# Patient Record
Sex: Male | Born: 1935 | Race: White | Hispanic: No | Marital: Married | Smoking: Current some day smoker
Health system: Southern US, Community
[De-identification: ages and names within clinical notes are randomized; demographics above are authoritative.]

## PROBLEM LIST (undated history)

## (undated) DIAGNOSIS — J449 Chronic obstructive pulmonary disease, unspecified: Secondary | ICD-10-CM

## (undated) DIAGNOSIS — I10 Essential (primary) hypertension: Secondary | ICD-10-CM

## (undated) DIAGNOSIS — M1611 Unilateral primary osteoarthritis, right hip: Secondary | ICD-10-CM

## (undated) DIAGNOSIS — M75111 Incomplete rotator cuff tear or rupture of right shoulder, not specified as traumatic: Secondary | ICD-10-CM

## (undated) DIAGNOSIS — R351 Nocturia: Secondary | ICD-10-CM

## (undated) DIAGNOSIS — M1990 Unspecified osteoarthritis, unspecified site: Secondary | ICD-10-CM

## (undated) DIAGNOSIS — I480 Paroxysmal atrial fibrillation: Secondary | ICD-10-CM

## (undated) DIAGNOSIS — J45909 Unspecified asthma, uncomplicated: Secondary | ICD-10-CM

## (undated) DIAGNOSIS — Z7901 Long term (current) use of anticoagulants: Secondary | ICD-10-CM

## (undated) DIAGNOSIS — R0602 Shortness of breath: Secondary | ICD-10-CM

## (undated) DIAGNOSIS — E785 Hyperlipidemia, unspecified: Secondary | ICD-10-CM

## (undated) HISTORY — DX: Paroxysmal atrial fibrillation: I48.0

## (undated) HISTORY — DX: Chronic obstructive pulmonary disease, unspecified: J44.9

## (undated) HISTORY — DX: Essential (primary) hypertension: I10

## (undated) HISTORY — DX: Unspecified osteoarthritis, unspecified site: M19.90

## (undated) HISTORY — DX: Long term (current) use of anticoagulants: Z79.01

## (undated) HISTORY — PX: APPENDECTOMY: SHX54

## (undated) HISTORY — PX: OTHER SURGICAL HISTORY: SHX169

## (undated) HISTORY — PX: CATARACT EXTRACTION W/ INTRAOCULAR LENS  IMPLANT, BILATERAL: SHX1307

## (undated) HISTORY — DX: Hyperlipidemia, unspecified: E78.5

## (undated) HISTORY — PX: CHOLECYSTECTOMY: SHX55

---

## 1992-04-29 HISTORY — PX: SHOULDER ARTHROSCOPY: SHX128

## 1996-04-29 HISTORY — PX: TOTAL HIP ARTHROPLASTY: SHX124

## 1999-08-07 ENCOUNTER — Encounter: Payer: Self-pay | Admitting: Cardiology

## 1999-08-07 ENCOUNTER — Inpatient Hospital Stay (HOSPITAL_COMMUNITY): Admission: AD | Admit: 1999-08-07 | Discharge: 1999-08-14 | Payer: Self-pay | Admitting: Cardiology

## 1999-08-08 ENCOUNTER — Encounter: Payer: Self-pay | Admitting: Cardiology

## 1999-08-29 ENCOUNTER — Encounter: Payer: Self-pay | Admitting: Cardiology

## 1999-08-29 ENCOUNTER — Ambulatory Visit (HOSPITAL_COMMUNITY): Admission: RE | Admit: 1999-08-29 | Discharge: 1999-08-29 | Payer: Self-pay | Admitting: Cardiology

## 1999-10-10 ENCOUNTER — Emergency Department (HOSPITAL_COMMUNITY): Admission: EM | Admit: 1999-10-10 | Discharge: 1999-10-10 | Payer: Self-pay | Admitting: Emergency Medicine

## 2000-04-07 ENCOUNTER — Encounter: Payer: Self-pay | Admitting: Endocrinology

## 2000-04-07 ENCOUNTER — Inpatient Hospital Stay (HOSPITAL_COMMUNITY): Admission: AD | Admit: 2000-04-07 | Discharge: 2000-04-10 | Payer: Self-pay | Admitting: Endocrinology

## 2000-04-09 ENCOUNTER — Encounter: Payer: Self-pay | Admitting: Internal Medicine

## 2000-04-11 ENCOUNTER — Encounter: Payer: Self-pay | Admitting: Orthopaedic Surgery

## 2000-04-11 ENCOUNTER — Encounter: Admission: RE | Admit: 2000-04-11 | Discharge: 2000-04-11 | Payer: Self-pay | Admitting: Orthopedic Surgery

## 2000-04-18 ENCOUNTER — Encounter: Payer: Self-pay | Admitting: Internal Medicine

## 2000-04-18 ENCOUNTER — Ambulatory Visit (HOSPITAL_COMMUNITY): Admission: RE | Admit: 2000-04-18 | Discharge: 2000-04-18 | Payer: Self-pay | Admitting: Internal Medicine

## 2000-07-29 ENCOUNTER — Ambulatory Visit (HOSPITAL_COMMUNITY): Admission: RE | Admit: 2000-07-29 | Discharge: 2000-07-29 | Payer: Self-pay | Admitting: Internal Medicine

## 2000-07-29 ENCOUNTER — Encounter: Payer: Self-pay | Admitting: Internal Medicine

## 2001-10-09 ENCOUNTER — Ambulatory Visit (HOSPITAL_COMMUNITY): Admission: RE | Admit: 2001-10-09 | Discharge: 2001-10-09 | Payer: Self-pay | Admitting: Internal Medicine

## 2001-10-21 ENCOUNTER — Ambulatory Visit (HOSPITAL_COMMUNITY): Admission: RE | Admit: 2001-10-21 | Discharge: 2001-10-21 | Payer: Self-pay | Admitting: Internal Medicine

## 2001-12-15 ENCOUNTER — Encounter: Payer: Self-pay | Admitting: Internal Medicine

## 2001-12-15 ENCOUNTER — Ambulatory Visit (HOSPITAL_COMMUNITY): Admission: RE | Admit: 2001-12-15 | Discharge: 2001-12-15 | Payer: Self-pay | Admitting: Internal Medicine

## 2002-06-01 ENCOUNTER — Encounter: Payer: Self-pay | Admitting: Orthopedic Surgery

## 2002-06-04 ENCOUNTER — Inpatient Hospital Stay (HOSPITAL_COMMUNITY): Admission: RE | Admit: 2002-06-04 | Discharge: 2002-06-09 | Payer: Self-pay | Admitting: Orthopedic Surgery

## 2002-06-07 ENCOUNTER — Encounter: Payer: Self-pay | Admitting: Orthopedic Surgery

## 2004-07-13 ENCOUNTER — Encounter: Admission: RE | Admit: 2004-07-13 | Discharge: 2004-07-13 | Payer: Self-pay | Admitting: Internal Medicine

## 2004-07-24 ENCOUNTER — Encounter: Admission: RE | Admit: 2004-07-24 | Discharge: 2004-07-24 | Payer: Self-pay | Admitting: Internal Medicine

## 2004-07-27 ENCOUNTER — Ambulatory Visit (HOSPITAL_COMMUNITY): Admission: RE | Admit: 2004-07-27 | Discharge: 2004-07-27 | Payer: Self-pay | Admitting: Internal Medicine

## 2006-08-20 ENCOUNTER — Ambulatory Visit: Payer: Self-pay | Admitting: Gastroenterology

## 2006-09-03 ENCOUNTER — Encounter: Payer: Self-pay | Admitting: Gastroenterology

## 2006-09-03 ENCOUNTER — Ambulatory Visit: Payer: Self-pay | Admitting: Gastroenterology

## 2009-06-10 ENCOUNTER — Inpatient Hospital Stay (HOSPITAL_COMMUNITY): Admission: EM | Admit: 2009-06-10 | Discharge: 2009-06-18 | Payer: Self-pay | Admitting: Emergency Medicine

## 2009-06-13 ENCOUNTER — Encounter (INDEPENDENT_AMBULATORY_CARE_PROVIDER_SITE_OTHER): Payer: Self-pay | Admitting: Internal Medicine

## 2009-07-21 ENCOUNTER — Emergency Department (HOSPITAL_COMMUNITY): Admission: EM | Admit: 2009-07-21 | Discharge: 2009-07-21 | Payer: Self-pay | Admitting: Family Medicine

## 2009-12-22 ENCOUNTER — Ambulatory Visit: Payer: Self-pay | Admitting: Cardiology

## 2010-01-10 ENCOUNTER — Ambulatory Visit: Payer: Self-pay | Admitting: Cardiology

## 2010-02-12 ENCOUNTER — Ambulatory Visit: Payer: Self-pay | Admitting: Cardiology

## 2010-02-26 ENCOUNTER — Ambulatory Visit: Payer: Self-pay | Admitting: Cardiology

## 2010-03-26 ENCOUNTER — Ambulatory Visit: Payer: Self-pay | Admitting: Cardiology

## 2010-04-25 ENCOUNTER — Ambulatory Visit: Payer: Self-pay | Admitting: Cardiology

## 2010-04-29 DIAGNOSIS — M75111 Incomplete rotator cuff tear or rupture of right shoulder, not specified as traumatic: Secondary | ICD-10-CM

## 2010-04-29 HISTORY — DX: Incomplete rotator cuff tear or rupture of right shoulder, not specified as traumatic: M75.111

## 2010-05-23 ENCOUNTER — Ambulatory Visit: Payer: Self-pay | Admitting: Cardiology

## 2010-06-19 ENCOUNTER — Other Ambulatory Visit (INDEPENDENT_AMBULATORY_CARE_PROVIDER_SITE_OTHER): Payer: Medicare Other

## 2010-06-19 DIAGNOSIS — Z7901 Long term (current) use of anticoagulants: Secondary | ICD-10-CM

## 2010-06-19 DIAGNOSIS — I4891 Unspecified atrial fibrillation: Secondary | ICD-10-CM

## 2010-07-11 ENCOUNTER — Ambulatory Visit: Payer: Self-pay | Admitting: Cardiology

## 2010-07-12 ENCOUNTER — Encounter: Payer: Self-pay | Admitting: Cardiology

## 2010-07-17 ENCOUNTER — Ambulatory Visit (INDEPENDENT_AMBULATORY_CARE_PROVIDER_SITE_OTHER): Payer: Medicare Other | Admitting: Cardiology

## 2010-07-17 ENCOUNTER — Other Ambulatory Visit: Payer: Self-pay | Admitting: *Deleted

## 2010-07-17 ENCOUNTER — Encounter: Payer: Self-pay | Admitting: Cardiology

## 2010-07-17 VITALS — BP 100/40 | HR 54 | Wt 184.8 lb

## 2010-07-17 DIAGNOSIS — Z7901 Long term (current) use of anticoagulants: Secondary | ICD-10-CM | POA: Insufficient documentation

## 2010-07-17 DIAGNOSIS — I4891 Unspecified atrial fibrillation: Secondary | ICD-10-CM

## 2010-07-17 DIAGNOSIS — J449 Chronic obstructive pulmonary disease, unspecified: Secondary | ICD-10-CM

## 2010-07-17 DIAGNOSIS — R002 Palpitations: Secondary | ICD-10-CM

## 2010-07-17 MED ORDER — DIGOXIN 125 MCG PO TABS: 125.0000 ug | ORAL_TABLET | Freq: Every day | ORAL | Status: DC

## 2010-07-17 NOTE — Progress Notes (Signed)
Subjective:Kyle [name] is seen today for followup visit. Overall, he's doing reasonably well but notes considerable palpitations particularly at night. His heart doesn't race particularly and he notes that in the 80-90 range although his wife thinks at times it's faster. While he's had palpitations and almost certainly has had recurrent atrial fib, he has not had lightheadedness, dizziness, chest pain, or shortness of breath.  Current Outpatient Prescriptions  Medication Sig Dispense Refill  . Alfuzosin HCl (UROXATRAL PO) Take 10 mg by mouth 2 (two) times daily.        Marland Kitchen atenolol (TENORMIN) 50 MG tablet Take 50 mg by mouth daily.        Marland Kitchen atorvastatin (LIPITOR) 10 MG tablet Take 10 mg by mouth daily.        . Cholecalciferol (VITAMIN D) 1000 UNITS capsule Take 1,000 Units by mouth as directed. OCCASIONALLY       . Ipratropium-Albuterol (DUONEB IN) Inhale into the lungs 2 (two) times daily.        . Multiple Vitamins-Minerals (OCUVITE PO) Take by mouth daily.        Marland Kitchen triamterene-hydrochlorothiazide (DYAZIDE) 37.5-25 MG per capsule Take 1 capsule by mouth daily.        Marland Kitchen warfarin (COUMADIN) 2 MG tablet Take 2 mg by mouth as directed.          Allergies  Allergen Reactions  . Dilaudid (Hydromorphone Hcl)   . Morphine And Related     Patient Active Problem List  Diagnoses  . Atrial fibrillation    History  Smoking status  . Current Everyday Smoker  . Types: Pipe  Smokeless tobacco  . Not on file    History  Alcohol Use     Family History  Problem Relation Age of Onset  . Cancer Father   . Cancer Mother   . Other Brother     SUICIDE    Review of Systems: The patient denies any heat or cold intolerance.  No weight gain or weight loss.  The patient denies headaches or blurry vision.  There is no cough or sputum production.  The patient denies dizziness.  There is no hematuria or hematochezia.  The patient denies any muscle aches or arthritis.  The patient denies any rash.  The  patient denies frequent falling or instability.  There is no history of depression or anxiety.  All other systems were reviewed and are negative.   Physical Exam:vital signs are noted. Weights 184. He is normocephalic and atraumatic pupils are equally round and reactive to light. Neck is supple without bruits. Lungs show prolonged expiration. Heart shows a regular rate and rhythm today. There is no murmur or gallop. Abdomen soft nontender. Extremities are without edema.  Assessment / Plan:

## 2010-07-17 NOTE — Assessment & Plan Note (Signed)
His INR is 2.1. We will check again in 4 weeks.

## 2010-07-17 NOTE — Assessment & Plan Note (Signed)
Briefly talked about adding Multaq his regimen. I think he is protected with his warfarin anticoagulation. We'll split his beta blocker to atenolol 25 mg b.i.d. I'll add digoxin 0.125 milligrams. We'll see him back in 2 months with Lawson Fiscal. At that time, if he still complaining of palpitations, will consider the addition of Multaq.

## 2010-07-17 NOTE — Patient Instructions (Signed)
Cut your atenolol in half and take one half in the morning and the other half at night. Start the Digoxin 0.125mg  daily Prescription was sent to the drug store

## 2010-07-18 ENCOUNTER — Telehealth: Payer: Self-pay | Admitting: *Deleted

## 2010-07-18 NOTE — Telephone Encounter (Signed)
Returned pt's call regarding new medication.  Pt is concerned about taking Digoxin 0.125 mg and resting heart rate ranging from 56-58.  Dr. Deborah Chalk notified and instructed RN to have pt start Digoxin and monitor HR and call if HR drops below 50 or if new symptoms develop.  Pt notified and verbalized to RN understanding of instructions.

## 2010-07-18 NOTE — Telephone Encounter (Signed)
Message copied by Barnetta Hammersmith on Wed Jul 18, 2010  1:39 PM ------      Message from: Norma Fredrickson      Created: Wed Jul 18, 2010 10:41 AM      Regarding: RE: NEEDS TO ASK ABOUT NEW MED THAT HE WAS STARTED ON 07/17/10      Contact: [phone]       Kellly,      We told him to split dose his Atenolol. 1/2 tab in the morning and a 1/2 tab in the evening. He is also to start the Digoxin 0.125mg .      Thanks      lori      ----- Message -----         From: Maryruth Hancock Burns-Spencer         Sent: 07/18/2010  10:39 AM           To: Rosalio Macadamia, PA      Subject: FW: NEEDS TO ASK ABOUT NEW MED THAT HE WAS S#                        ----- Message -----         From: June Bullock         Sent: 07/18/2010  10:09 AM           To: Maryruth Hancock Burns-Spencer, [place] Slider      Subject: NEEDS TO ASK ABOUT NEW MED THAT HE WAS START#            NEEDS TO ASK QUESTIONS ABOUT THE NEW DRUG THAT DR. Deborah Chalk STARTED HIM ON YESTERDAY. CONCERNED ABOUT THE SIDE EFFECTS.

## 2010-07-19 LAB — COMPREHENSIVE METABOLIC PANEL
ALT: 16 U/L (ref 0–53)
ALT: 19 U/L (ref 0–53)
ALT: 43 U/L (ref 0–53)
ALT: 52 U/L (ref 0–53)
ALT: 53 U/L (ref 0–53)
AST: 39 U/L — ABNORMAL HIGH (ref 0–37)
AST: 48 U/L — ABNORMAL HIGH (ref 0–37)
AST: 55 U/L — ABNORMAL HIGH (ref 0–37)
Albumin: 2.5 g/dL — ABNORMAL LOW (ref 3.5–5.2)
Albumin: 2.7 g/dL — ABNORMAL LOW (ref 3.5–5.2)
Albumin: 2.8 g/dL — ABNORMAL LOW (ref 3.5–5.2)
Albumin: 2.9 g/dL — ABNORMAL LOW (ref 3.5–5.2)
Albumin: 3 g/dL — ABNORMAL LOW (ref 3.5–5.2)
Alkaline Phosphatase: 35 U/L — ABNORMAL LOW (ref 39–117)
Alkaline Phosphatase: 53 U/L (ref 39–117)
Alkaline Phosphatase: 58 U/L (ref 39–117)
Alkaline Phosphatase: 66 U/L (ref 39–117)
BUN: 10 mg/dL (ref 6–23)
BUN: 13 mg/dL (ref 6–23)
BUN: 7 mg/dL (ref 6–23)
BUN: 9 mg/dL (ref 6–23)
CO2: 24 mEq/L (ref 19–32)
CO2: 26 mEq/L (ref 19–32)
CO2: 27 mEq/L (ref 19–32)
CO2: 28 mEq/L (ref 19–32)
CO2: 29 mEq/L (ref 19–32)
CO2: 29 mEq/L (ref 19–32)
Calcium: 8 mg/dL — ABNORMAL LOW (ref 8.4–10.5)
Calcium: 8.3 mg/dL — ABNORMAL LOW (ref 8.4–10.5)
Chloride: 101 mEq/L (ref 96–112)
Chloride: 101 mEq/L (ref 96–112)
Chloride: 101 mEq/L (ref 96–112)
Chloride: 104 mEq/L (ref 96–112)
Chloride: 99 mEq/L (ref 96–112)
Creatinine, Ser: 0.9 mg/dL (ref 0.4–1.5)
Creatinine, Ser: 0.92 mg/dL (ref 0.4–1.5)
Creatinine, Ser: 0.97 mg/dL (ref 0.4–1.5)
Creatinine, Ser: 1.02 mg/dL (ref 0.4–1.5)
GFR calc Af Amer: >60 mL/min (ref >60)
GFR calc Af Amer: >60 mL/min (ref >60)
GFR calc non Af Amer: 60 mL/min — ABNORMAL LOW (ref >60)
GFR calc non Af Amer: >60 mL/min (ref >60)
GFR calc non Af Amer: >60 mL/min (ref >60)
GFR calc non Af Amer: >60 mL/min (ref >60)
Glucose, Bld: 107 mg/dL — ABNORMAL HIGH (ref 70–99)
Glucose, Bld: 112 mg/dL — ABNORMAL HIGH (ref 70–99)
Glucose, Bld: 115 mg/dL — ABNORMAL HIGH (ref 70–99)
Glucose, Bld: 121 mg/dL — ABNORMAL HIGH (ref 70–99)
Potassium: 3.4 mEq/L — ABNORMAL LOW (ref 3.5–5.1)
Potassium: 3.5 mEq/L (ref 3.5–5.1)
Potassium: 3.5 mEq/L (ref 3.5–5.1)
Potassium: 3.8 mEq/L (ref 3.5–5.1)
Sodium: 133 mEq/L — ABNORMAL LOW (ref 135–145)
Sodium: 136 mEq/L (ref 135–145)
Sodium: 137 mEq/L (ref 135–145)
Sodium: 137 mEq/L (ref 135–145)
Total Bilirubin: 0.5 mg/dL (ref 0.3–1.2)
Total Bilirubin: 0.5 mg/dL (ref 0.3–1.2)
Total Bilirubin: 0.5 mg/dL (ref 0.3–1.2)
Total Bilirubin: 0.5 mg/dL (ref 0.3–1.2)
Total Bilirubin: 0.5 mg/dL (ref 0.3–1.2)
Total Bilirubin: 0.7 mg/dL (ref 0.3–1.2)
Total Protein: 5.3 g/dL — ABNORMAL LOW (ref 6.0–8.3)
Total Protein: 5.4 g/dL — ABNORMAL LOW (ref 6.0–8.3)
Total Protein: 5.7 g/dL — ABNORMAL LOW (ref 6.0–8.3)
Total Protein: 5.8 g/dL — ABNORMAL LOW (ref 6.0–8.3)

## 2010-07-19 LAB — PROTIME-INR
INR: 1.05 (ref 0.00–1.49)
INR: 1.21 (ref 0.00–1.49)
INR: 2.11 — ABNORMAL HIGH (ref 0.00–1.49)
INR: 2.11 — ABNORMAL HIGH (ref 0.00–1.49)
Prothrombin Time: 19 seconds — ABNORMAL HIGH (ref 11.6–15.2)
Prothrombin Time: 23.5 seconds — ABNORMAL HIGH (ref 11.6–15.2)

## 2010-07-19 LAB — DIFFERENTIAL
Basophils Absolute: 0 10*3/uL (ref 0.0–0.1)
Basophils Absolute: 0 10*3/uL (ref 0.0–0.1)
Basophils Absolute: 0 10*3/uL (ref 0.0–0.1)
Basophils Absolute: 0 10*3/uL (ref 0.0–0.1)
Basophils Absolute: 0 10*3/uL (ref 0.0–0.1)
Basophils Relative: 0 % (ref 0–1)
Basophils Relative: 0 % (ref 0–1)
Basophils Relative: 0 % (ref 0–1)
Basophils Relative: 0 % (ref 0–1)
Blasts: 0 %
Eosinophils Absolute: 0 10*3/uL (ref 0.0–0.7)
Eosinophils Absolute: 0 10*3/uL (ref 0.0–0.7)
Eosinophils Relative: 1 % (ref 0–5)
Eosinophils Relative: 1 % (ref 0–5)
Eosinophils Relative: 1 % (ref 0–5)
Lymphocytes Relative: 20 % (ref 12–46)
Lymphocytes Relative: 22 % (ref 12–46)
Lymphocytes Relative: 29 % (ref 12–46)
Lymphocytes Relative: 38 % (ref 12–46)
Lymphocytes Relative: 6 % — ABNORMAL LOW (ref 12–46)
Lymphs Abs: 0.7 10*3/uL (ref 0.7–4.0)
Lymphs Abs: 1.3 10*3/uL (ref 0.7–4.0)
Monocytes Absolute: 0.9 10*3/uL (ref 0.1–1.0)
Monocytes Absolute: 1 10*3/uL (ref 0.1–1.0)
Monocytes Absolute: 1.2 10*3/uL — ABNORMAL HIGH (ref 0.1–1.0)
Monocytes Absolute: 1.2 10*3/uL — ABNORMAL HIGH (ref 0.1–1.0)
Monocytes Relative: 19 % — ABNORMAL HIGH (ref 3–12)
Monocytes Relative: 20 % — ABNORMAL HIGH (ref 3–12)
Monocytes Relative: 8 % (ref 3–12)
Myelocytes: 0 %
Neutro Abs: 3.2 10*3/uL (ref 1.7–7.7)
Neutro Abs: 3.6 10*3/uL (ref 1.7–7.7)
Neutro Abs: 3.7 10*3/uL (ref 1.7–7.7)
Neutro Abs: 5.6 10*3/uL (ref 1.7–7.7)
Neutrophils Relative %: 53 % (ref 43–77)
Neutrophils Relative %: 56 % (ref 43–77)
Neutrophils Relative %: 58 % (ref 43–77)
Promyelocytes Absolute: 0 %
nRBC: 0 /100 WBC

## 2010-07-19 LAB — CBC
HCT: 32.8 % — ABNORMAL LOW (ref 39.0–52.0)
HCT: 33.9 % — ABNORMAL LOW (ref 39.0–52.0)
HCT: 35.9 % — ABNORMAL LOW (ref 39.0–52.0)
HCT: 36.9 % — ABNORMAL LOW (ref 39.0–52.0)
HCT: 38.7 % — ABNORMAL LOW (ref 39.0–52.0)
HCT: 39.6 % (ref 39.0–52.0)
Hemoglobin: 12.4 g/dL — ABNORMAL LOW (ref 13.0–17.0)
Hemoglobin: 12.8 g/dL — ABNORMAL LOW (ref 13.0–17.0)
Hemoglobin: 13.2 g/dL (ref 13.0–17.0)
MCHC: 34.2 g/dL (ref 30.0–36.0)
MCHC: 34.6 g/dL (ref 30.0–36.0)
MCHC: 34.8 g/dL (ref 30.0–36.0)
MCV: 89.3 fL (ref 78.0–100.0)
MCV: 89.8 fL (ref 78.0–100.0)
MCV: 90.9 fL (ref 78.0–100.0)
MCV: 91 fL (ref 78.0–100.0)
MCV: 91 fL (ref 78.0–100.0)
Platelets: 159 10*3/uL (ref 150–400)
Platelets: 166 10*3/uL (ref 150–400)
Platelets: 200 10*3/uL (ref 150–400)
Platelets: 333 10*3/uL (ref 150–400)
Platelets: 341 10*3/uL (ref 150–400)
RBC: 3.85 MIL/uL — ABNORMAL LOW (ref 4.22–5.81)
RBC: 3.96 MIL/uL — ABNORMAL LOW (ref 4.22–5.81)
RBC: 4.07 MIL/uL — ABNORMAL LOW (ref 4.22–5.81)
RBC: 4.24 MIL/uL (ref 4.22–5.81)
RDW: 14.2 % (ref 11.5–15.5)
RDW: 14.6 % (ref 11.5–15.5)
RDW: 14.7 % (ref 11.5–15.5)
RDW: 14.8 % (ref 11.5–15.5)
RDW: 14.9 % (ref 11.5–15.5)
WBC: 5.4 10*3/uL (ref 4.0–10.5)
WBC: 7 10*3/uL (ref 4.0–10.5)
WBC: 7.9 10*3/uL (ref 4.0–10.5)

## 2010-07-19 LAB — BASIC METABOLIC PANEL
Calcium: 8.1 mg/dL — ABNORMAL LOW (ref 8.4–10.5)
Chloride: 96 mEq/L (ref 96–112)
Sodium: 130 mEq/L — ABNORMAL LOW (ref 135–145)

## 2010-07-19 LAB — URINALYSIS, ROUTINE W REFLEX MICROSCOPIC
Hgb urine dipstick: NEGATIVE
Leukocytes, UA: NEGATIVE
Specific Gravity, Urine: 1.019 (ref 1.005–1.030)
Urobilinogen, UA: 0.2 mg/dL (ref 0.0–1.0)

## 2010-07-19 LAB — POCT CARDIAC MARKERS
CKMB, poc: 3.5 ng/mL (ref 1.0–8.0)
Myoglobin, poc: 409 ng/mL (ref 12–200)
Troponin i, poc: <0.05 ng/mL (ref 0.00–0.09)

## 2010-07-19 LAB — CARDIAC PANEL(CRET KIN+CKTOT+MB+TROPI)
CK, MB: 5.6 ng/mL — ABNORMAL HIGH (ref 0.3–4.0)
CK, MB: 7.2 ng/mL (ref 0.3–4.0)
Relative Index: 2.3 (ref 0.0–2.5)
Total CK: 264 U/L — ABNORMAL HIGH (ref 7–232)

## 2010-07-19 LAB — URINE MICROSCOPIC-ADD ON

## 2010-07-19 LAB — HEPATIC FUNCTION PANEL: Indirect Bilirubin: 0.5 mg/dL (ref 0.3–0.9)

## 2010-07-19 LAB — BRAIN NATRIURETIC PEPTIDE: Pro B Natriuretic peptide (BNP): 197 pg/mL — ABNORMAL HIGH (ref 0.0–100.0)

## 2010-08-14 ENCOUNTER — Encounter: Payer: Medicare Other | Admitting: *Deleted

## 2010-08-16 ENCOUNTER — Ambulatory Visit (INDEPENDENT_AMBULATORY_CARE_PROVIDER_SITE_OTHER): Payer: Medicare Other | Admitting: *Deleted

## 2010-08-16 DIAGNOSIS — I4891 Unspecified atrial fibrillation: Secondary | ICD-10-CM

## 2010-08-16 LAB — POCT INR: INR: 2.8

## 2010-08-17 ENCOUNTER — Telehealth: Payer: Self-pay | Admitting: *Deleted

## 2010-08-17 NOTE — Telephone Encounter (Signed)
Pt was in the office on 08/16/10 for Coumadin check, complaining of more atrial fib and feeling weak and tired.  No chest pain or shortness of breath.  Pt does have some chest tightness at times and uses oxygen for chest tightness.  Pt unable to stay for office visit because pt was going to pick up wife who recently had a stroke from the nursing home.  RN left message for pt to call back on 08/17/10.  RN notified Norma Fredrickson NP of pt's symptoms and Lawson Fiscal suggested pt come in to be seen.   Pt called back and RN scheduled pt to come in on 08/20/10 for office visit.

## 2010-08-20 ENCOUNTER — Encounter: Payer: Self-pay | Admitting: Nurse Practitioner

## 2010-08-20 ENCOUNTER — Encounter: Payer: Self-pay | Admitting: Cardiology

## 2010-08-20 ENCOUNTER — Ambulatory Visit (INDEPENDENT_AMBULATORY_CARE_PROVIDER_SITE_OTHER): Payer: Medicare Other | Admitting: Nurse Practitioner

## 2010-08-20 VITALS — BP 90/60 | HR 68 | Ht 73.0 in | Wt 175.3 lb

## 2010-08-20 DIAGNOSIS — I10 Essential (primary) hypertension: Secondary | ICD-10-CM

## 2010-08-20 DIAGNOSIS — R5381 Other malaise: Secondary | ICD-10-CM

## 2010-08-20 DIAGNOSIS — R531 Weakness: Secondary | ICD-10-CM | POA: Insufficient documentation

## 2010-08-20 DIAGNOSIS — I4891 Unspecified atrial fibrillation: Secondary | ICD-10-CM

## 2010-08-20 NOTE — Assessment & Plan Note (Signed)
His blood pressure is too low here and at home. I have stopped his Dyazide. I will see him back in one week. He is to call for any problems in the interim.

## 2010-08-20 NOTE — Assessment & Plan Note (Signed)
I feel like this is more related to his blood pressure being too low.

## 2010-08-20 NOTE — Progress Notes (Signed)
Niraj Roppolo Date of Birth: 1926/06/30   History of Present Illness: Kyle [name] is seen today for a work in visit. He called last week and was complaining that his "atrial fib might be acting up". He is seen for Dr. Deborah Chalk. He feels weak. His blood pressure at home has been low. He does not rest well at home. His wife has just had a small stroke. He denies chest pain. He does have a tendency of his heart "beating hard" at night, but he is able to go to sleep. He is in atrial fib today and really doesn't seem aware of it. He is not having chest pain.   Current Outpatient Prescriptions on File Prior to Visit  Medication Sig Dispense Refill  . Alfuzosin HCl (UROXATRAL PO) Take 10 mg by mouth 2 (two) times daily.        Marland Kitchen atenolol (TENORMIN) 50 MG tablet Take 50 mg by mouth daily.        Marland Kitchen atorvastatin (LIPITOR) 10 MG tablet Take 10 mg by mouth daily.        . Cholecalciferol (VITAMIN D) 1000 UNITS capsule Take 1,000 Units by mouth as directed. OCCASIONALLY       . digoxin (LANOXIN) 0.125 MG tablet Take 1 tablet (125 mcg total) by mouth daily.  30 tablet  6  . Ipratropium-Albuterol (DUONEB IN) Inhale into the lungs 2 (two) times daily.        . Multiple Vitamins-Minerals (OCUVITE PO) Take by mouth daily.        Marland Kitchen warfarin (COUMADIN) 2 MG tablet Take 2 mg by mouth as directed.        Marland Kitchen DISCONTD: triamterene-hydrochlorothiazide (DYAZIDE) 37.5-25 MG per capsule Take 1 capsule by mouth daily.          Allergies  Allergen Reactions  . Dilaudid (Hydromorphone Hcl)   . Morphine And Related     Past Medical History  Diagnosis Date  . PAF (paroxysmal atrial fibrillation)   . COPD (chronic obstructive pulmonary disease)   . HTN (hypertension)   . Hyperlipemia   . OA (osteoarthritis)   . Chronic anticoagulation     Past Surgical History  Procedure Date  . Total hip arthroplasty 1998  . Shoulder arthroscopy 1994    RIGHT  . Cholecystectomy   . Appendectomy   . Cataract extraction w/  intraocular lens  implant, bilateral     History  Smoking status  . Current Some Day Smoker  . Types: Pipe  Smokeless tobacco  . Not on file    History  Alcohol Use No    Family History  Problem Relation Age of Onset  . Cancer Father   . Cancer Mother   . Other Brother     SUICIDE    Review of Systems: The review of systems is positive for generalized weakness.  All other systems were reviewed and are negative.  Physical Exam: BP 90/60  Pulse 68  Ht 6\' 1"  (1.854 m)  Wt 175 lb 5 oz (79.521 kg)  BMI 23.13 kg/m2 Patient is very pleasant and in no acute distress. Skin is warm and dry. Color is normal.  HEENT is unremarkable. Normocephalic/atraumatic. PERRL. Sclera are nonicteric. Neck is supple. No masses. No JVD. Lungs are have expiratory wheezes throughout. Cardiac exam shows an irregular rhythm today. His rate is controlled. Abdomen is soft. Extremities are without edema. Gait and ROM are intact. No gross neurologic deficits noted.  LABORATORY DATA:  EKG today shows atrial fib  with a controlled ventricular response.    Assessment / Plan:

## 2010-08-20 NOTE — Patient Instructions (Signed)
I want you to stop the Dyazide (triameterene HCTZ). Stay on your other medicines. I will see you back in one week.

## 2010-08-20 NOTE — Assessment & Plan Note (Signed)
He is in atrial fib today. He remains on coumadin and his INR last week was therapeutic. I am not convinced that he is really aware of the atrial fib. His blood pressure is low here and has been low at home. I am going to adjust his blood pressure medicines and then see him back in one week. If his weakness continues, we will then consider starting Multaq. Patient is agreeable to this plan and will call if any problems develop in the interim.

## 2010-08-23 ENCOUNTER — Telehealth: Payer: Self-pay | Admitting: Cardiology

## 2010-08-23 NOTE — Telephone Encounter (Signed)
Pt said he changed his appt for next week because he is going out of town but his BP is down and wanted to talk to you to see if he needs to reinstate the appt.

## 2010-08-23 NOTE — Telephone Encounter (Signed)
RN rescheduled pt to see Lawson Fiscal on 08/27/10.

## 2010-08-27 ENCOUNTER — Encounter: Payer: Self-pay | Admitting: Nurse Practitioner

## 2010-08-27 ENCOUNTER — Ambulatory Visit: Payer: Medicare Other | Admitting: Nurse Practitioner

## 2010-08-27 ENCOUNTER — Ambulatory Visit (INDEPENDENT_AMBULATORY_CARE_PROVIDER_SITE_OTHER): Payer: Medicare Other | Admitting: Nurse Practitioner

## 2010-08-27 VITALS — BP 106/58 | HR 60 | Wt 181.0 lb

## 2010-08-27 DIAGNOSIS — I4891 Unspecified atrial fibrillation: Secondary | ICD-10-CM

## 2010-08-27 DIAGNOSIS — I10 Essential (primary) hypertension: Secondary | ICD-10-CM

## 2010-08-27 DIAGNOSIS — R531 Weakness: Secondary | ICD-10-CM

## 2010-08-27 DIAGNOSIS — R5381 Other malaise: Secondary | ICD-10-CM

## 2010-08-27 NOTE — Assessment & Plan Note (Signed)
Blood pressure is satisfactory now. He is off of his HCTZ. He will continue to monitor at home.

## 2010-08-27 NOTE — Assessment & Plan Note (Signed)
He is in sinus today. It sounds like he got better with discontinuation of his HCTZ. His wife is very adamant that he continues to go in and out of rhythm. I have not started him on additional medicine at this time (Multaq). I will see him back in about 3 weeks and reassess the situation. He understands that his risk of stroke is lower since he is on his coumadin. He does not really want any additional medicines at this time. Patient is agreeable to this plan and will call if any problems develop in the interim.

## 2010-08-27 NOTE — Assessment & Plan Note (Signed)
His symptoms are better. It sounds like to me, that he has gotten better with reduction of his blood pressure medicine. His wife says he has continued to go in and out of rhythm over this past week.

## 2010-08-27 NOTE — Progress Notes (Signed)
   Kyle [name] Date of Birth: [birth year]   History of Present Illness: Kyle [name] is seen back today for a follow up appointment. He is seen for Dr. Deborah Chalk. He was here about one week ago. He was feeling bad. His blood pressure was low. He was in atrial fib. I stopped his HCTZ. He has done much better over the last 4 to 5 days. His wife is here with him today. She notes that he continues to be in and out of atrial fib. His blood pressure is coming up and averaging in the 110 to 115 systolic range. He denies chest pain. He remains on coumadin.   Current Outpatient Prescriptions on File Prior to Visit  Medication Sig Dispense Refill  . Alfuzosin HCl (UROXATRAL PO) Take 10 mg by mouth 2 (two) times daily.        Marland Kitchen atenolol (TENORMIN) 50 MG tablet Take 50 mg by mouth daily.        Marland Kitchen atorvastatin (LIPITOR) 10 MG tablet Take 10 mg by mouth daily.        . Cholecalciferol (VITAMIN D) 1000 UNITS capsule Take 1,000 Units by mouth as directed. OCCASIONALLY       . digoxin (LANOXIN) 0.125 MG tablet Take 1 tablet (125 mcg total) by mouth daily.  30 tablet  6  . Ipratropium-Albuterol (DUONEB IN) Inhale into the lungs 2 (two) times daily.        . Multiple Vitamins-Minerals (OCUVITE PO) Take by mouth daily.        Marland Kitchen warfarin (COUMADIN) 2 MG tablet Take 2 mg by mouth as directed.          Allergies  Allergen Reactions  . Dilaudid (Hydromorphone Hcl)   . Morphine And Related     Past Medical History  Diagnosis Date  . PAF (paroxysmal atrial fibrillation)   . COPD (chronic obstructive pulmonary disease)   . HTN (hypertension)   . Hyperlipemia   . OA (osteoarthritis)   . Chronic anticoagulation     Past Surgical History  Procedure Date  . Total hip arthroplasty 1998  . Shoulder arthroscopy 1994    RIGHT  . Cholecystectomy   . Appendectomy   . Cataract extraction w/ intraocular lens  implant, bilateral     History  Smoking status  . Current Some Day Smoker  . Types: Pipe  Smokeless tobacco  .  Not on file    History  Alcohol Use No    Family History  Problem Relation Age of Onset  . Cancer Father   . Cancer Mother   . Other Brother     SUICIDE    Review of Systems: The review of systems is positive for occasional palpitations. His weakness is better.  All other systems were reviewed and are negative.  Physical Exam: BP 106/58  Pulse 60  Wt 181 lb (82.101 kg) Patient is very pleasant and in no acute distress. Skin is warm and dry. Color is normal.  HEENT is unremarkable. Normocephalic/atraumatic. PERRL. Sclera are nonicteric. Neck is supple. No masses. No JVD. Lungs are clear. Cardiac exam shows a regular rate and rhythm today. Abdomen is soft. Extremities are without edema. Gait and ROM are intact. He is using a cane today. No gross neurologic deficits noted.  LABORATORY DATA:  EKG shows sinus brady, rate of 55   Assessment / Plan:

## 2010-08-27 NOTE — Patient Instructions (Signed)
Stay off the HCTZ Continue to monitor your blood pressure at home. I will see you as scheduled later in May.

## 2010-09-17 ENCOUNTER — Ambulatory Visit: Payer: Medicare Other | Admitting: Nurse Practitioner

## 2010-09-17 ENCOUNTER — Ambulatory Visit (INDEPENDENT_AMBULATORY_CARE_PROVIDER_SITE_OTHER): Payer: Medicare Other | Admitting: Nurse Practitioner

## 2010-09-17 ENCOUNTER — Telehealth: Payer: Self-pay | Admitting: *Deleted

## 2010-09-17 ENCOUNTER — Encounter: Payer: Self-pay | Admitting: Nurse Practitioner

## 2010-09-17 ENCOUNTER — Ambulatory Visit (INDEPENDENT_AMBULATORY_CARE_PROVIDER_SITE_OTHER): Payer: Medicare Other | Admitting: *Deleted

## 2010-09-17 ENCOUNTER — Ambulatory Visit
Admission: RE | Admit: 2010-09-17 | Discharge: 2010-09-17 | Disposition: A | Discharge status: Home / Self Care | Payer: Medicare Other | Source: Ambulatory Visit | Attending: Nurse Practitioner | Admitting: Nurse Practitioner

## 2010-09-17 VITALS — BP 90/50 | HR 62 | Ht 73.0 in | Wt 178.6 lb

## 2010-09-17 DIAGNOSIS — R5381 Other malaise: Secondary | ICD-10-CM

## 2010-09-17 DIAGNOSIS — R531 Weakness: Secondary | ICD-10-CM

## 2010-09-17 DIAGNOSIS — I4891 Unspecified atrial fibrillation: Secondary | ICD-10-CM

## 2010-09-17 DIAGNOSIS — R079 Chest pain, unspecified: Secondary | ICD-10-CM

## 2010-09-17 DIAGNOSIS — J449 Chronic obstructive pulmonary disease, unspecified: Secondary | ICD-10-CM

## 2010-09-17 DIAGNOSIS — I10 Essential (primary) hypertension: Secondary | ICD-10-CM

## 2010-09-17 DIAGNOSIS — R5383 Other fatigue: Secondary | ICD-10-CM

## 2010-09-17 LAB — HEPATIC FUNCTION PANEL
ALT: 13 U/L (ref 0–53)
AST: 18 U/L (ref 0–37)
Albumin: 3.7 g/dL (ref 3.5–5.2)
Alkaline Phosphatase: 39 U/L (ref 39–117)
Bilirubin, Direct: 0.1 mg/dL (ref 0.0–0.3)
Total Bilirubin: 0.6 mg/dL (ref 0.3–1.2)
Total Protein: 5.9 g/dL — ABNORMAL LOW (ref 6.0–8.3)

## 2010-09-17 LAB — BASIC METABOLIC PANEL
BUN: 25 mg/dL — ABNORMAL HIGH (ref 6–23)
CO2: 28 mEq/L (ref 19–32)
Calcium: 9.3 mg/dL (ref 8.4–10.5)
Chloride: 103 mEq/L (ref 96–112)
Creatinine, Ser: 1.3 mg/dL (ref 0.4–1.5)
GFR: 56.17 mL/min — ABNORMAL LOW (ref >60.00)
Glucose, Bld: 78 mg/dL (ref 70–99)
Potassium: 4.2 mEq/L (ref 3.5–5.1)
Sodium: 139 mEq/L (ref 135–145)

## 2010-09-17 LAB — CBC WITH DIFFERENTIAL/PLATELET
Basophils Absolute: 0.1 10*3/uL (ref 0.0–0.1)
Basophils Relative: 0.8 % (ref 0.0–3.0)
Eosinophils Absolute: 0.1 10*3/uL (ref 0.0–0.7)
Eosinophils Relative: 1.4 % (ref 0.0–5.0)
HCT: 38 % — ABNORMAL LOW (ref 39.0–52.0)
Hemoglobin: 12.9 g/dL — ABNORMAL LOW (ref 13.0–17.0)
Lymphocytes Relative: 31.8 % (ref 12.0–46.0)
Lymphs Abs: 2.5 10*3/uL (ref 0.7–4.0)
MCHC: 33.9 g/dL (ref 30.0–36.0)
MCV: 91.5 fl (ref 78.0–100.0)
Monocytes Absolute: 0.7 10*3/uL (ref 0.1–1.0)
Monocytes Relative: 9.2 % (ref 3.0–12.0)
Neutro Abs: 4.5 10*3/uL (ref 1.4–7.7)
Neutrophils Relative %: 56.8 % (ref 43.0–77.0)
Platelets: 246 10*3/uL (ref 150.0–400.0)
RBC: 4.15 Mil/uL — ABNORMAL LOW (ref 4.22–5.81)
RDW: 15.3 % — ABNORMAL HIGH (ref 11.5–14.6)
WBC: 7.9 10*3/uL (ref 4.5–10.5)

## 2010-09-17 LAB — TSH: TSH: 1.52 u[IU]/mL (ref 0.35–5.50)

## 2010-09-17 NOTE — Patient Instructions (Addendum)
I only want you to take just half of the Tenormin (Atenolol) just once a day. We are going to check your labs and a CXR today. I will see you back in one week. Keep checking your blood pressure at home and record your readings.

## 2010-09-17 NOTE — Assessment & Plan Note (Signed)
He remains off of his diuretic. Tenormin is cut back as well. He will continue to monitor his blood pressure at home.

## 2010-09-17 NOTE — Telephone Encounter (Signed)
L/M for pt to call back regarding his CXR results

## 2010-09-17 NOTE — Progress Notes (Signed)
Kyle [name] Date of Birth: [birth year]   History of Present Illness: Kyle [name] is seen today for a work in visit. He is seen for Dr. Deborah Chalk. He is not feeling well. He thinks his blood pressure is too low. His readings from home are reviewed and for the most part they are very good. He has had a couple of readings in the 100 systolic range. He feels "uncomfortable" in the upper abdomen and lower chest. He is not specific at all.  He has a chronic cough. He feels weak. This uncomfortable feeling is not exertional related. He has had no fever or chills. He is not sure how long it lasts. It may last all day for "just a little bit". He says his "get up and go" is just not there everyday. His bowels are ok. His wife is worried about his lungs. She does not able to provide much history either.  His last stress test was almost 2 years ago. He is not aware of his PAF. He denies being lightheaded or dizzy.    Current Outpatient Prescriptions on File Prior to Visit  Medication Sig Dispense Refill  . Alfuzosin HCl (UROXATRAL PO) Take 10 mg by mouth 2 (two) times daily.        Marland Kitchen atenolol (TENORMIN) 50 MG tablet Take 50 mg by mouth daily.        Marland Kitchen atorvastatin (LIPITOR) 10 MG tablet Take 10 mg by mouth daily.        . Cholecalciferol (VITAMIN D) 1000 UNITS capsule Take 1,000 Units by mouth as directed. OCCASIONALLY       . digoxin (LANOXIN) 0.125 MG tablet Take 1 tablet (125 mcg total) by mouth daily.  30 tablet  6  . Ipratropium-Albuterol (DUONEB IN) Inhale into the lungs 2 (two) times daily.        . Multiple Vitamins-Minerals (OCUVITE PO) Take by mouth daily.        Marland Kitchen warfarin (COUMADIN) 2 MG tablet Take 2 mg by mouth as directed.          Allergies  Allergen Reactions  . Dilaudid (Hydromorphone Hcl)   . Morphine And Related     Past Medical History  Diagnosis Date  . PAF (paroxysmal atrial fibrillation)   . COPD (chronic obstructive pulmonary disease)   . HTN (hypertension)   . Hyperlipemia   .  OA (osteoarthritis)   . Chronic anticoagulation     Past Surgical History  Procedure Date  . Total hip arthroplasty 1998  . Shoulder arthroscopy 1994    RIGHT  . Cholecystectomy   . Appendectomy   . Cataract extraction w/ intraocular lens  implant, bilateral     History  Smoking status  . Current Some Day Smoker  . Types: Pipe  Smokeless tobacco  . Not on file    History  Alcohol Use No    Family History  Problem Relation Age of Onset  . Cancer Father   . Cancer Mother   . Other Brother     SUICIDE    Review of Systems: The review of systems is as above.  All other systems were reviewed and are negative.  Physical Exam: BP 96/52  Pulse 62  Ht 6\' 1"  (1.854 m)  Wt 178 lb 9.6 oz (81.012 kg)  BMI 23.56 kg/m2 Patient is very pleasant and in no acute distress. Skin is warm and dry. Color is normal.  HEENT is unremarkable. Normocephalic/atraumatic. PERRL. Sclera are nonicteric. Neck is supple. No  masses. No JVD. Lungs show wheezes throughout. Cardiac exam shows a regular rate and rhythm today. Abdomen is soft. Extremities are without edema. Gait and ROM are intact. No gross neurologic deficits noted.  LABORATORY DATA:   EKG shows sinus brady today.    Assessment / Plan:

## 2010-09-17 NOTE — Assessment & Plan Note (Signed)
I am not sure what to make of his complaint today. His blood pressure is on the low side. I have cut the Tenormin back to just 25 mg a day. He is in sinus rhythm today. His rate is in the 50's. We will check complete lab work to include a dig level and CXR as well. I will see him back in one week. We may need to consider repeating his stress test. He may need to follow up with Dr. Waynard Edwards as well. He will continue to monitor his blood pressure at home. Patient is agreeable to this plan and will call if any problems develop in the interim.

## 2010-09-18 ENCOUNTER — Telehealth: Payer: Self-pay | Admitting: Cardiology

## 2010-09-18 LAB — DIGOXIN LEVEL: Digoxin Level: 1.2 ng/mL (ref 0.8–2.0)

## 2010-09-18 NOTE — Telephone Encounter (Addendum)
Calling to report labs and cxr. Left message for [name] to call back.  1330 Labs and chest x-ray reported to pt asked that we forward copy to [employer] informed would do that.

## 2010-09-18 NOTE — Telephone Encounter (Signed)
Message copied by Earma Reading on Tue Sep 18, 2010  9:14 AM ------      Message from: Norma Fredrickson      Created: Tue Sep 18, 2010  7:54 AM       Ok to report. Labs are satisfactory.

## 2010-09-18 NOTE — Telephone Encounter (Signed)
Message copied by Earma Reading on Tue Sep 18, 2010  9:15 AM ------      Message from: Norma Fredrickson      Created: Mon Sep 17, 2010  3:43 PM       Ok to report. CXR shows COPD. No acute findings.

## 2010-09-25 ENCOUNTER — Encounter: Payer: Self-pay | Admitting: Nurse Practitioner

## 2010-09-25 ENCOUNTER — Ambulatory Visit (INDEPENDENT_AMBULATORY_CARE_PROVIDER_SITE_OTHER): Payer: Medicare Other | Admitting: Nurse Practitioner

## 2010-09-25 VITALS — BP 120/64 | HR 70 | Ht 73.0 in | Wt 184.1 lb

## 2010-09-25 DIAGNOSIS — R0789 Other chest pain: Secondary | ICD-10-CM

## 2010-09-25 DIAGNOSIS — R531 Weakness: Secondary | ICD-10-CM

## 2010-09-25 DIAGNOSIS — I10 Essential (primary) hypertension: Secondary | ICD-10-CM

## 2010-09-25 DIAGNOSIS — R5381 Other malaise: Secondary | ICD-10-CM

## 2010-09-25 DIAGNOSIS — R5383 Other fatigue: Secondary | ICD-10-CM

## 2010-09-25 MED ORDER — ATENOLOL 50 MG PO TABS: 25.0000 mg | ORAL_TABLET | Freq: Every day | ORAL | Status: DC

## 2010-09-25 NOTE — Patient Instructions (Addendum)
We are going to get a stress test to try and see if this discomfort is coming from your heart. We will decide after the stress test when you need to come back and be seen

## 2010-09-25 NOTE — Assessment & Plan Note (Signed)
We will go ahead and update his stress test. I have ordered a Lexiscan to help further define his complaint. I have not given him a return appointment yet. Further disposition is to follow. He does have his physical with Dr. Waynard Edwards next week as well. He will be following up with Dr. Jens Som in light of Dr. Ronnald Nian retirement in June. Patient is agreeable to this plan and will call if any problems develop in the interim.

## 2010-09-25 NOTE — Assessment & Plan Note (Signed)
His labs from last week were ok. I have referred him for repeat stress testing to help further define.

## 2010-09-25 NOTE — Progress Notes (Signed)
Kyle [name] Date of Birth: 05/24/1926   History of Present Illness: Mr. Rieman is seen back today for a follow up visit. He is seen for Dr. Deborah Chalk. We had cut his atenolol back to just 25 mg one week ago due to low blood pressures at home. He notes that his blood pressure is better, but he does not feel any better. He remains very fatigued. He continues to have this atypical chest discomfort. It is not exertional in nature. It has no pattern. This discomfort is independent of his rhythm. He is not really symptomatic with his atrial fib.  The discomfort will fade without any intervention. It can last for about an hour.  He notes that it "just doesn't feel right". No real associated symptoms. His CXR showed COPD. His labs were ok. His BUN was up slightly but he admits to not drinking enough water. His last stress test was in August of 2010.   Current Outpatient Prescriptions on File Prior to Visit  Medication Sig Dispense Refill  . Alfuzosin HCl (UROXATRAL PO) Take 10 mg by mouth 2 (two) times daily.        Marland Kitchen atenolol (TENORMIN) 50 MG tablet Take 25 mg by mouth daily.       Marland Kitchen atorvastatin (LIPITOR) 10 MG tablet Take 10 mg by mouth daily.        . Cholecalciferol (VITAMIN D) 1000 UNITS capsule Take 1,000 Units by mouth as directed. OCCASIONALLY       . digoxin (LANOXIN) 0.125 MG tablet Take 1 tablet (125 mcg total) by mouth daily.  30 tablet  6  . Ipratropium-Albuterol (DUONEB IN) Inhale into the lungs 2 (two) times daily.        . Multiple Vitamins-Minerals (OCUVITE PO) Take by mouth daily.        Marland Kitchen warfarin (COUMADIN) 2 MG tablet Take 2 mg by mouth as directed.          Allergies  Allergen Reactions  . Dilaudid (Hydromorphone Hcl)   . Morphine And Related     Past Medical History  Diagnosis Date  . PAF (paroxysmal atrial fibrillation)     Managed with rate control and coumadin  . COPD (chronic obstructive pulmonary disease)   . HTN (hypertension)   . Hyperlipemia   . OA  (osteoarthritis)   . Chronic anticoagulation     Past Surgical History  Procedure Date  . Total hip arthroplasty 1998  . Shoulder arthroscopy 1994    RIGHT  . Cholecystectomy   . Appendectomy   . Cataract extraction w/ intraocular lens  implant, bilateral     History  Smoking status  . Current Some Day Smoker  . Types: Pipe  Smokeless tobacco  . Not on file    History  Alcohol Use No    Family History  Problem Relation Age of Onset  . Cancer Father   . Cancer Mother   . Other Brother     SUICIDE    Review of Systems: The review of systems is positive for fatigue and atypical chest pain. He does have a degree of chronic shortness of breath. No cough. No fever or chills reported. He is not dizzy. Blood pressure has come up with reduction of his beta blocker. His diary is reviewed and is satisfactory.  All other systems were reviewed and are negative.  Physical Exam: BP 120/64  Pulse 70  Ht 6\' 1"  (1.854 m)  Wt 184 lb 2 oz (83.519 kg)  BMI  24.29 kg/m2 Patient is very pleasant and in no acute distress. Skin is warm and dry. Color is normal.  HEENT is unremarkable. Normocephalic/atraumatic. PERRL. Sclera are nonicteric. Neck is supple. No masses. No JVD. Lungs are coarse with scattered wheezes. Cardiac exam shows a regular rate and rhythm. Abdomen is soft. Extremities are without edema. Gait and ROM are intact. No gross neurologic deficits noted.  LABORATORY DATA: N/A  Assessment / Plan:

## 2010-09-25 NOTE — Assessment & Plan Note (Signed)
Blood pressure is satisfactory with this current regimen. He will continue to monitor at home.

## 2010-09-26 ENCOUNTER — Encounter: Payer: Self-pay | Admitting: Nurse Practitioner

## 2010-09-27 ENCOUNTER — Other Ambulatory Visit: Payer: Self-pay | Admitting: *Deleted

## 2010-09-27 ENCOUNTER — Ambulatory Visit (HOSPITAL_COMMUNITY): Payer: Medicare Other | Attending: Cardiology | Admitting: Radiology

## 2010-09-27 ENCOUNTER — Other Ambulatory Visit (HOSPITAL_COMMUNITY): Payer: Medicare Other | Admitting: Radiology

## 2010-09-27 VITALS — Ht 73.0 in | Wt 179.0 lb

## 2010-09-27 DIAGNOSIS — R0602 Shortness of breath: Secondary | ICD-10-CM

## 2010-09-27 DIAGNOSIS — R0609 Other forms of dyspnea: Secondary | ICD-10-CM

## 2010-09-27 DIAGNOSIS — I4891 Unspecified atrial fibrillation: Secondary | ICD-10-CM

## 2010-09-27 DIAGNOSIS — R0789 Other chest pain: Secondary | ICD-10-CM | POA: Insufficient documentation

## 2010-09-27 MED ORDER — TECHNETIUM TC 99M TETROFOSMIN IV KIT
11.0000 | PACK | Freq: Once | INTRAVENOUS | Status: AC | PRN
  Administered 2010-09-27: 11 via INTRAVENOUS

## 2010-09-27 MED ORDER — DIGOXIN 125 MCG PO TABS: 125.0000 ug | ORAL_TABLET | Freq: Every day | ORAL | Status: DC

## 2010-09-27 MED ORDER — TECHNETIUM TC 99M TETROFOSMIN IV KIT
33.0000 | PACK | Freq: Once | INTRAVENOUS | Status: AC | PRN
  Administered 2010-09-27: 33 via INTRAVENOUS

## 2010-09-27 MED ORDER — REGADENOSON 0.4 MG/5ML IV SOLN
0.4000 mg | Freq: Once | INTRAVENOUS | Status: AC
  Administered 2010-09-27: 0.4 mg via INTRAVENOUS

## 2010-09-27 NOTE — Progress Notes (Signed)
[facility] SITE 3 NUCLEAR MED [address] [place] [address] [phone]  Cardiology Nuclear Med Study  Kyle [name] is a 77 y.o. male [id number] 02-09-1927   Nuclear Med Background Indication for Stress Test:  Evaluation for Ischemia History:  COPD,02/11 Echo:EF 50-55% and 08/10 Myocardial Perfusion Study EF 73% NL Cardiac Risk Factors: Hypertension, Lipids and Smoker  Symptoms:  Chest Pain, DOE, Fatigue, Fatigue with Exertion, Palpitations and SOB   Nuclear Pre-Procedure Caffeine/Decaff Intake:  None NPO After: 7:00pm   Lungs:  clear IV 0.9% NS with Angio Cath:  18g  IV Site: R Antecubital  IV Started by:  Irean Hong, RN  Chest Size (in):  38 Cup Size: n/a  Height: 6\' 1"  (1.854 m)  Weight:  179 lb (81.194 kg)  BMI:  Body mass index is 23.62 kg/(m^2). Tech Comments:  Held atenolol x 24 hrs. This patient was seen by  Dr. Kathie Rhodes. [name]'s office previously. He is usually in a NSR, but today he comes in with AFIB. He has a H/O and he states yesterday he woke up feeling horrible and fatigued. He was advised to call with any additional problems.    Nuclear Med Study 1 or 2 day study: 1 day  Stress Test Type:  Eugenie Birks  Reading MD: Olga Millers, MD  Order Authorizing Provider:  S.[name]  Resting Radionuclide: Technetium 54m Tetrofosmin  Resting Radionuclide Dose: 11.0 mCi   Stress Radionuclide:  Technetium 47m Tetrofosmin  Stress Radionuclide Dose: 33.0 mCi           Stress Protocol Rest HR: 74 Stress HR:120  Rest BP: 119/70 Stress BP: 125/57  Exercise Time (min): n/a METS: n/a   Predicted Max HR: 138 bpm % Max HR: 86.96 bpm Rate Pressure Product: 21308   Dose of Adenosine (mg):  n/a Dose of Lexiscan: 0.4 mg  Dose of Atropine (mg): n/a Dose of Dobutamine: n/a mcg/kg/min (at max HR)  Stress Test Technologist: Milana Na, EMT-P  Nuclear Technologist:  Domenic Polite, CNMT     Rest Procedure:  Myocardial perfusion imaging was performed at  rest 45 minutes following the intravenous administration of Technetium 31m Tetrofosmin. Rest ECG: Atrial Fibrilliation  Stress Procedure:  The patient received IV Lexiscan 0.4 mg over 15-seconds.  Technetium 85m Tetrofosmin injected at 30-seconds.  There were no significant changes with Lexiscan.  Quantitative spect images were obtained after a 45 minute delay. Stress ECG: Not interpretable due to use of digoxin.  QPS Raw Data Images:  Acquisition technically good; normal left ventricular size. Stress Images:  There is decreased uptake in the inferior wall and apex. Rest Images:  There is decreased uptake in the inferior wall and apex. Subtraction (SDS):  No evidence of ischemia. Transient Ischemic Dilatation (Normal <1.22):  1.06 Lung/Heart Ratio (Normal <0.45):  0.28  Quantitative Gated Spect Images QGS EDV:  NA QGS ESV:  NA QGS cine images:  Not GatedQGS EF:Not Gated   Impression Exercise Capacity:  Lexiscan with no exercise. BP Response:  Normal blood pressure response. Clinical Symptoms:  No chest pain. ECG Impression:  Not interpretable due to use of digoxin. Comparison with Prior Nuclear Study: No images to compare  Overall Impression:  Probably normal stress nuclear study with small mild defects c/w inferoseptal and apical thinning but no ischemia.  Olga Millers

## 2010-09-28 NOTE — Progress Notes (Signed)
Copy routed to Dr. [name].Falecha L Clark °  °

## 2010-10-03 ENCOUNTER — Telehealth: Payer: Self-pay | Admitting: *Deleted

## 2010-10-03 NOTE — Progress Notes (Signed)
Satisfactory

## 2010-10-03 NOTE — Telephone Encounter (Signed)
Pt notified of nuclear results.   

## 2010-10-08 ENCOUNTER — Telehealth: Payer: Self-pay | Admitting: *Deleted

## 2010-10-08 NOTE — Telephone Encounter (Signed)
Pt called, c/o feet and legs swelling since Friday.  Pt is not short of breath and has not had any extra salt.  Pt would like to know if Digoxin could be causing the symptoms.  Norma Fredrickson NP notified and instructed RN to have pt wear support stockings, keep feet elevated as much as possible, avoid excessive salt/sodium, and weigh daily.  Pt notified of Lori's instructions and verbalized to RN understanding of instructions.  Pt will f/u with Dr. Waynard Edwards tomorrow.

## 2010-10-15 ENCOUNTER — Encounter: Payer: Medicare Other | Admitting: *Deleted

## 2010-10-16 ENCOUNTER — Ambulatory Visit (INDEPENDENT_AMBULATORY_CARE_PROVIDER_SITE_OTHER): Payer: Medicare Other | Admitting: *Deleted

## 2010-10-16 DIAGNOSIS — I4891 Unspecified atrial fibrillation: Secondary | ICD-10-CM

## 2010-11-13 ENCOUNTER — Ambulatory Visit (INDEPENDENT_AMBULATORY_CARE_PROVIDER_SITE_OTHER): Payer: Medicare Other | Admitting: *Deleted

## 2010-11-13 DIAGNOSIS — I4891 Unspecified atrial fibrillation: Secondary | ICD-10-CM

## 2010-11-13 LAB — POCT INR: INR: 3

## 2010-12-11 ENCOUNTER — Ambulatory Visit (INDEPENDENT_AMBULATORY_CARE_PROVIDER_SITE_OTHER): Payer: Medicare Other | Admitting: *Deleted

## 2010-12-11 DIAGNOSIS — I4891 Unspecified atrial fibrillation: Secondary | ICD-10-CM

## 2010-12-28 ENCOUNTER — Encounter: Payer: Medicare Other | Admitting: *Deleted

## 2011-02-06 ENCOUNTER — Telehealth: Payer: Self-pay | Admitting: Nurse Practitioner

## 2011-02-06 NOTE — Telephone Encounter (Signed)
Pt wants to talk to you about his wife. She has had a stroke and  he said that he doesn't know who to talk to. He wants some advise about a Dr for her. please call

## 2011-02-06 NOTE — Telephone Encounter (Signed)
Spoke with wife and patient and advised they could call [facility] or go to PCP.  Husband stated no signs of stroke, advised not a patient here so we could not refer.

## 2011-02-06 NOTE — Telephone Encounter (Signed)
WANTS TO REFER HIS WIFE AS A NEW PATIENT FOR AN AVAILABLE DOCTOR. SHE REALLY WANTS TO SEE AN STROKE DOCTOR.

## 2011-04-03 ENCOUNTER — Encounter: Payer: Self-pay | Admitting: Nurse Practitioner

## 2011-07-15 ENCOUNTER — Encounter: Payer: Self-pay | Admitting: Gastroenterology

## 2011-07-27 ENCOUNTER — Emergency Department (HOSPITAL_COMMUNITY): Payer: Medicare Other

## 2011-07-27 ENCOUNTER — Emergency Department (HOSPITAL_COMMUNITY)
Admission: EM | Admit: 2011-07-27 | Discharge: 2011-07-27 | Disposition: A | Discharge status: Home / Self Care | Payer: Medicare Other | Attending: Emergency Medicine | Admitting: Emergency Medicine

## 2011-07-27 ENCOUNTER — Encounter (HOSPITAL_COMMUNITY): Payer: Self-pay | Admitting: Emergency Medicine

## 2011-07-27 DIAGNOSIS — I10 Essential (primary) hypertension: Secondary | ICD-10-CM | POA: Insufficient documentation

## 2011-07-27 DIAGNOSIS — T148XXA Other injury of unspecified body region, initial encounter: Secondary | ICD-10-CM | POA: Insufficient documentation

## 2011-07-27 DIAGNOSIS — IMO0002 Reserved for concepts with insufficient information to code with codable children: Secondary | ICD-10-CM

## 2011-07-27 DIAGNOSIS — I4891 Unspecified atrial fibrillation: Secondary | ICD-10-CM | POA: Insufficient documentation

## 2011-07-27 DIAGNOSIS — W010XXA Fall on same level from slipping, tripping and stumbling without subsequent striking against object, initial encounter: Secondary | ICD-10-CM | POA: Insufficient documentation

## 2011-07-27 DIAGNOSIS — S0292XA Unspecified fracture of facial bones, initial encounter for closed fracture: Secondary | ICD-10-CM

## 2011-07-27 DIAGNOSIS — S0280XA Fracture of other specified skull and facial bones, unspecified side, initial encounter for closed fracture: Secondary | ICD-10-CM | POA: Insufficient documentation

## 2011-07-27 DIAGNOSIS — S0180XA Unspecified open wound of other part of head, initial encounter: Secondary | ICD-10-CM | POA: Insufficient documentation

## 2011-07-27 DIAGNOSIS — W19XXXA Unspecified fall, initial encounter: Secondary | ICD-10-CM

## 2011-07-27 DIAGNOSIS — Z7901 Long term (current) use of anticoagulants: Secondary | ICD-10-CM | POA: Insufficient documentation

## 2011-07-27 HISTORY — DX: Incomplete rotator cuff tear or rupture of right shoulder, not specified as traumatic: M75.111

## 2011-07-27 LAB — PROTIME-INR: INR: 3.39 — ABNORMAL HIGH (ref 0.00–1.49)

## 2011-07-27 MED ORDER — CEFPODOXIME PROXETIL 200 MG PO TABS: 400.0000 mg | ORAL_TABLET | Freq: Two times a day (BID) | ORAL | Status: DC

## 2011-07-27 MED ORDER — CEFPODOXIME PROXETIL 200 MG PO TABS: 400.0000 mg | ORAL_TABLET | Freq: Two times a day (BID) | ORAL | Status: AC

## 2011-07-27 NOTE — ED Notes (Signed)
Patient discharged via wheelchair without problems.

## 2011-07-27 NOTE — ED Notes (Signed)
Dry sterile dressings applied to the left elbow , wrist and hand. As directed by Dr. Hyman Hopes.

## 2011-07-27 NOTE — ED Notes (Signed)
Patient was working on [employer] and tried to stand lost balance and fell, onto pavement. Injury to left cheek laceration approximately 1/2 inch  also below left eye. second laceration 1.5 inch laceration deep above left eye. No Loc Skin tear to left elbow, skin tear left wrist. Also complains of right sided should pain , this is a chronic problem.

## 2011-07-27 NOTE — ED Notes (Signed)
Wounds cleaned with Saf Clens AF, Sterile dressing with Vaseline gauze applied to the left elbow and forearm and left  hand. Also a dry sterile dressing applied to facial lacerations until sutures are applied to prevent oozing of wound.

## 2011-07-27 NOTE — Discharge Instructions (Signed)
Take your antibiotics as prescribed. Do not blow your nose for two weeks. Return to ED for change in vision, double vision, fever, worsening pain, or if you are concerned.  Laceration Care, Adult A laceration is a cut or lesion that goes through all layers of the skin and into the tissue just beneath the skin. TREATMENT  Some lacerations may not require closure. Some lacerations may not be able to be closed due to an increased risk of infection. It is important to see your caregiver as soon as possible after an injury to minimize the risk of infection and maximize the opportunity for successful closure. If closure is appropriate, pain medicines may be given, if needed. The wound will be cleaned to help prevent infection. Your caregiver will use stitches (sutures), staples, wound glue (adhesive), or skin adhesive strips to repair the laceration. These tools bring the skin edges together to allow for faster healing and a better cosmetic outcome. However, all wounds will heal with a scar. Once the wound has healed, scarring can be minimized by covering the wound with sunscreen during the day for 1 full year. HOME CARE INSTRUCTIONS  For sutures or staples:  Keep the wound clean and dry.   If you were given a bandage (dressing), you should change it at least once a day. Also, change the dressing if it becomes wet or dirty, or as directed by your caregiver.   Wash the wound with soap and water 2 times a day. Rinse the wound off with water to remove all soap. Pat the wound dry with a clean towel.   After cleaning, apply a thin layer of the antibiotic ointment as recommended by your caregiver. This will help prevent infection and keep the dressing from sticking.   You may shower as usual after the first 24 hours. Do not soak the wound in water until the sutures are removed.   Only take over-the-counter or prescription medicines for pain, discomfort, or fever as directed by your caregiver.   Get your  sutures or staples removed as directed by your caregiver.  For skin adhesive strips:  Keep the wound clean and dry.   Do not get the skin adhesive strips wet. You may bathe carefully, using caution to keep the wound dry.   If the wound gets wet, pat it dry with a clean towel.   Skin adhesive strips will fall off on their own. You may trim the strips as the wound heals. Do not remove skin adhesive strips that are still stuck to the wound. They will fall off in time.  For wound adhesive:  You may briefly wet your wound in the shower or bath. Do not soak or scrub the wound. Do not swim. Avoid periods of heavy perspiration until the skin adhesive has fallen off on its own. After showering or bathing, gently pat the wound dry with a clean towel.   Do not apply liquid medicine, cream medicine, or ointment medicine to your wound while the skin adhesive is in place. This may loosen the film before your wound is healed.   If a dressing is placed over the wound, be careful not to apply tape directly over the skin adhesive. This may cause the adhesive to be pulled off before the wound is healed.   Avoid prolonged exposure to sunlight or tanning lamps while the skin adhesive is in place. Exposure to ultraviolet light in the first year will darken the scar.   The skin adhesive will usually  remain in place for 5 to 10 days, then naturally fall off the skin. Do not pick at the adhesive film.  You may need a tetanus shot if:  You cannot remember when you had your last tetanus shot.   You have never had a tetanus shot.  If you get a tetanus shot, your arm may swell, get red, and feel warm to the touch. This is common and not a problem. If you need a tetanus shot and you choose not to have one, there is a rare chance of getting tetanus. Sickness from tetanus can be serious. SEEK MEDICAL CARE IF:   You have redness, swelling, or increasing pain in the wound.   You see a red line that goes away from the  wound.   You have yellowish-white fluid (pus) coming from the wound.   You have a fever.   You notice a bad smell coming from the wound or dressing.   Your wound breaks open before or after sutures have been removed.   You notice something coming out of the wound such as wood or glass.   Your wound is on your hand or foot and you cannot move a finger or toe.  SEEK IMMEDIATE MEDICAL CARE IF:   Your pain is not controlled with prescribed medicine.   You have severe swelling around the wound causing pain and numbness or a change in color in your arm, hand, leg, or foot.   Your wound splits open and starts bleeding.   You have worsening numbness, weakness, or loss of function of any joint around or beyond the wound.   You develop painful lumps near the wound or on the skin anywhere on your body.  MAKE SURE YOU:   Understand these instructions.   Will watch your condition.   Will get help right away if you are not doing well or get worse.  Document Released: 04/15/2005 Document Revised: 04/04/2011 Document Reviewed: 10/09/2010 [facility] Patient Information 2012 [place], [place].  Facial Fracture A facial fracture is a break in one of the bones of your face. HOME CARE INSTRUCTIONS   Protect the injured part of your face until it is healed.   Do not participate in activities which give chance for re-injury until your doctor approves.   Gently wash and dry your face.   Wear head and facial protection while riding a bicycle, motorcycle, or snowmobile.  SEEK MEDICAL CARE IF:   An oral temperature above 102 F (38.9 C) develops.   You have severe headaches or notice changes in your vision.   You have new numbness or tingling in your face.   You develop nausea (feeling sick to your stomach), vomiting or a stiff neck.  SEEK IMMEDIATE MEDICAL CARE IF:   You develop difficulty seeing or experience double vision.   You become dizzy, lightheaded, or faint.   You develop  trouble speaking, breathing, or swallowing.   You have a watery discharge from your nose or ear.  MAKE SURE YOU:   Understand these instructions.   Will watch your condition.   Will get help right away if you are not doing well or get worse.  Document Released: 04/15/2005 Document Revised: 04/04/2011 Document Reviewed: 12/03/2007 [facility] Patient Information 2012 [place], [place].Laceration Care, Adult A laceration is a cut or lesion that goes through all layers of the skin and into the tissue just beneath the skin. TREATMENT  Some lacerations may not require closure. Some lacerations may not be able to be  closed due to an increased risk of infection. It is important to see your caregiver as soon as possible after an injury to minimize the risk of infection and maximize the opportunity for successful closure. If closure is appropriate, pain medicines may be given, if needed. The wound will be cleaned to help prevent infection. Your caregiver will use stitches (sutures), staples, wound glue (adhesive), or skin adhesive strips to repair the laceration. These tools bring the skin edges together to allow for faster healing and a better cosmetic outcome. However, all wounds will heal with a scar. Once the wound has healed, scarring can be minimized by covering the wound with sunscreen during the day for 1 full year. HOME CARE INSTRUCTIONS  For sutures or staples:  Keep the wound clean and dry.   If you were given a bandage (dressing), you should change it at least once a day. Also, change the dressing if it becomes wet or dirty, or as directed by your caregiver.   Wash the wound with soap and water 2 times a day. Rinse the wound off with water to remove all soap. Pat the wound dry with a clean towel.   After cleaning, apply a thin layer of the antibiotic ointment as recommended by your caregiver. This will help prevent infection and keep the dressing from sticking.   You may shower as usual  after the first 24 hours. Do not soak the wound in water until the sutures are removed.   Only take over-the-counter or prescription medicines for pain, discomfort, or fever as directed by your caregiver.   Get your sutures or staples removed as directed by your caregiver.  For skin adhesive strips:  Keep the wound clean and dry.   Do not get the skin adhesive strips wet. You may bathe carefully, using caution to keep the wound dry.   If the wound gets wet, pat it dry with a clean towel.   Skin adhesive strips will fall off on their own. You may trim the strips as the wound heals. Do not remove skin adhesive strips that are still stuck to the wound. They will fall off in time.  For wound adhesive:  You may briefly wet your wound in the shower or bath. Do not soak or scrub the wound. Do not swim. Avoid periods of heavy perspiration until the skin adhesive has fallen off on its own. After showering or bathing, gently pat the wound dry with a clean towel.   Do not apply liquid medicine, cream medicine, or ointment medicine to your wound while the skin adhesive is in place. This may loosen the film before your wound is healed.   If a dressing is placed over the wound, be careful not to apply tape directly over the skin adhesive. This may cause the adhesive to be pulled off before the wound is healed.   Avoid prolonged exposure to sunlight or tanning lamps while the skin adhesive is in place. Exposure to ultraviolet light in the first year will darken the scar.   The skin adhesive will usually remain in place for 5 to 10 days, then naturally fall off the skin. Do not pick at the adhesive film.  You may need a tetanus shot if:  You cannot remember when you had your last tetanus shot.   You have never had a tetanus shot.  If you get a tetanus shot, your arm may swell, get red, and feel warm to the touch. This is common and not  a problem. If you need a tetanus shot and you choose not to have  one, there is a rare chance of getting tetanus. Sickness from tetanus can be serious. SEEK MEDICAL CARE IF:   You have redness, swelling, or increasing pain in the wound.   You see a red line that goes away from the wound.   You have yellowish-white fluid (pus) coming from the wound.   You have a fever.   You notice a bad smell coming from the wound or dressing.   Your wound breaks open before or after sutures have been removed.   You notice something coming out of the wound such as wood or glass.   Your wound is on your hand or foot and you cannot move a finger or toe.  SEEK IMMEDIATE MEDICAL CARE IF:   Your pain is not controlled with prescribed medicine.   You have severe swelling around the wound causing pain and numbness or a change in color in your arm, hand, leg, or foot.   Your wound splits open and starts bleeding.   You have worsening numbness, weakness, or loss of function of any joint around or beyond the wound.   You develop painful lumps near the wound or on the skin anywhere on your body.  MAKE SURE YOU:   Understand these instructions.   Will watch your condition.   Will get help right away if you are not doing well or get worse.  Document Released: 04/15/2005 Document Revised: 04/04/2011 Document Reviewed: 10/09/2010 [facility] Patient Information 2012 [place], [place].

## 2011-07-27 NOTE — ED Provider Notes (Addendum)
History     CSN: [id number]  Arrival date & time 07/27/11  1111   First MD Initiated Contact with Patient 07/27/11 1114      Chief Complaint  Patient presents with  . Fall    (Consider location/radiation/quality/duration/timing/severity/associated sxs/prior treatment) HPI  84yoM history of paroxysmal atrial fibrillation on Coumadin, COPD, hypertension, osteoarthritis presents with facial pain after fall. Patient states that he was working on a lawnmower out of a chair and tried to stand. He states he lost his balance and fell down onto the driveway which is patent. He complains of facial trauma. He denies neck pain, back pain. He denies hip pain. He was ambulatory after the fall. He notes several skin tears to his left upper extremity. He also complains of right shoulder pain which is chronic and unchanged from previous. Denies left shoulder pain. Denies numbness, tingling, weakness of his extremities. Denies headache, dizziness, cp, palpitations, shortness of breath pre fall and currently. No neck pain or back pain. Denies hip pain. No blurry or double vision.   ED Notes, ED Provider Notes       Dani Gobble 07/27/2011 11:18      Patient was working on a mower and tried to stand lost balance and fell, onto pavement. Injury to left cheek laceration approximately 1/2 inch also below left eye. second laceration 1.5 inch laceration deep above left eye. No Loc Skin tear to left elbow, skin tear left wrist. Also complains of right sided should pain , this is a chronic problem.    Past Medical History  Diagnosis Date  . PAF (paroxysmal atrial fibrillation)     Managed with rate control and coumadin  . COPD (chronic obstructive pulmonary disease)   . HTN (hypertension)   . Hyperlipemia   . OA (osteoarthritis)   . Chronic anticoagulation   . Incomplete rotator cuff tear or rupture of right shoulder, not specified as traumatic     Past Surgical History  Procedure Date  . Total  hip arthroplasty 1998  . Shoulder arthroscopy 1994    RIGHT  . Cholecystectomy   . Appendectomy   . Cataract extraction w/ intraocular lens  implant, bilateral     Family History  Problem Relation Age of Onset  . Cancer Father   . Cancer Mother   . Other Brother     SUICIDE    History  Substance Use Topics  . Smoking status: Current Some Day Smoker    Types: Pipe  . Smokeless tobacco: Not on file  . Alcohol Use: No    Review of Systems  All other systems reviewed and are negative.  except as noted HPI  Allergies  Advair diskus; Aspirin; Augmentin; Dilaudid; and Morphine and related  Home Medications   Current Outpatient Rx  Name Route Sig Dispense Refill  . ACETAMINOPHEN 500 MG PO TABS Oral Take 500 mg by mouth every 6 (six) hours as needed. For pain    . UROXATRAL PO Oral Take 10 mg by mouth 2 (two) times daily.      . ATENOLOL 50 MG PO TABS Oral Take 0.5 tablets (25 mg total) by mouth daily.    . ATORVASTATIN CALCIUM 10 MG PO TABS Oral Take 10 mg by mouth daily.      Marland Kitchen DIGOXIN 0.125 MG PO TABS Oral Take 1 tablet (125 mcg total) by mouth daily. 90 tablet 3  . OCUVITE PO Oral Take by mouth daily.      . OXYCODONE HCL  5 MG PO TABS Oral Take 5 mg by mouth every 4 (four) hours as needed. For pain    . TRIAMTERENE-HCTZ 37.5-25 MG PO TABS Oral Take 0.5 tablets by mouth daily.    . WARFARIN SODIUM 5 MG PO TABS Oral Take 5-7.5 mg by mouth daily at 6 PM. Take 1 tablet in Sun, tues, Fri. Take 1.5 tabelt on Wed, thrus, Sat, Mon    . CEFPODOXIME PROXETIL 200 MG PO TABS Oral Take 2 tablets (400 mg total) by mouth 2 (two) times daily. 40 tablet 0    BP 127/82  Pulse 56  Temp(Src) 98.1 F (36.7 C) (Oral)  Resp 18  SpO2 100%  Physical Exam  Nursing note and vitals reviewed. Constitutional: He is oriented to person, place, and time. He appears well-developed and well-nourished. No distress.  HENT:  Head: Atraumatic.  Mouth/Throat: Oropharynx is clear and moist.       Lt  cheek with full sensation  Eyes: Conjunctivae and EOM are normal. Pupils are equal, round, and reactive to light.       EOMI   Neck: Neck supple.  Cardiovascular: Normal rate, regular rhythm, normal heart sounds and intact distal pulses.  Exam reveals no gallop and no friction rub.   No murmur heard. Pulmonary/Chest: Effort normal. No respiratory distress. He has no wheezes. He has no rales.  Abdominal: Soft. Bowel sounds are normal. There is no tenderness. There is no rebound and no guarding.  Musculoskeletal: Normal range of motion. He exhibits no edema and no tenderness.       Lt shoulder +contusion with min abrasion, no overlying ttp. Distal sensation, pulses intact  No midline c/t/l/s ttp   Neurological: He is alert and oriented to person, place, and time.  Skin: Skin is warm and dry.       Skin laceration lt hand, lateral 4cm Lt wrist 2cm Lt forearm 2cm Lt elbow 2cm  Lt supraorbital 4cm laceration with underlying bony ttp and edema Lt cheek 2cm laceration with underlying bony ttp     Psychiatric: He has a normal mood and affect.    ED Course  LACERATION REPAIR Date/Time: 07/27/2011 2:06 PM Performed by: Forbes Cellar Authorized by: Forbes Cellar Consent: Verbal consent obtained. Consent given by: patient Patient understanding: patient states understanding of the procedure being performed Patient consent: the patient's understanding of the procedure matches consent given Procedure consent: procedure consent matches procedure scheduled Patient identity confirmed: verbally with patient Body area: upper extremity Location details: left hand Laceration length: 4 cm Foreign bodies: no foreign bodies Tendon involvement: none Vascular damage: no Anesthesia: local infiltration Local anesthetic: lidocaine 1% without epinephrine Anesthetic total: 2 ml Patient sedated: no Irrigation solution: saline Amount of cleaning: standard Skin closure: 4-0 nylon Number of  sutures: 6 Technique: simple Patient tolerance: Patient tolerated the procedure well with no immediate complications.  LACERATION REPAIR Date/Time: 07/27/2011 2:07 PM Performed by: Forbes Cellar Authorized by: Forbes Cellar Consent: Verbal consent obtained. Consent given by: patient Patient understanding: patient states understanding of the procedure being performed Patient consent: the patient's understanding of the procedure matches consent given Procedure consent: procedure consent matches procedure scheduled Patient identity confirmed: verbally with patient Time out: Immediately prior to procedure a "time out" was called to verify the correct patient, procedure, equipment, support staff and site/side marked as required. Body area: upper extremity Location details: left upper arm Laceration length: 2 cm Foreign bodies: no foreign bodies Tendon involvement: none Nerve involvement: none Vascular damage: no Patient  sedated: no Skin closure: Steri-Strips Number of sutures: 5 Approximation difficulty: simple Comments: Skin tear  LACERATION REPAIR Date/Time: 07/27/2011 3:56 PM Performed by: Forbes Cellar Authorized by: Forbes Cellar Risks and benefits: risks, benefits and alternatives were discussed Consent given by: patient Patient understanding: patient states understanding of the procedure being performed Patient consent: the patient's understanding of the procedure matches consent given Procedure consent: procedure consent matches procedure scheduled Patient identity confirmed: verbally with patient Body area: head/neck Location details: left eyelid Laceration length: 4 cm Foreign bodies: no foreign bodies Tendon involvement: none Nerve involvement: none Vascular damage: no Anesthesia: local infiltration Anesthetic total: 2 ml Patient sedated: no Irrigation solution: saline Amount of cleaning: standard Skin closure: 6-0 nylon Number of sutures: 5 Technique:  simple Approximation difficulty: simple Dressing: antibiotic ointment Patient tolerance: Patient tolerated the procedure well with no immediate complications.  LACERATION REPAIR Date/Time: 07/27/2011 2:07 PM Performed by: Forbes Cellar Authorized by: Forbes Cellar Consent: Verbal consent obtained. Consent given by: patient Patient understanding: patient states understanding of the procedure being performed Patient consent: the patient's understanding of the procedure matches consent given Procedure consent: procedure consent matches procedure scheduled Patient identity confirmed: verbally with patient Time out: Immediately prior to procedure a "time out" was called to verify the correct patient, procedure, equipment, support staff and site/side marked as required. Body area: upper extremity Location details: left upper arm Laceration length: 2 cm Foreign bodies: no foreign bodies Tendon involvement: none Nerve involvement: none Vascular damage: no Patient sedated: no Skin closure: Steri-Strips Number of sutures: 5 Approximation difficulty: simple Comments: Skin tear  LACERATION REPAIR Date/Time: 07/27/2011 2:06 PM Performed by: Forbes Cellar Authorized by: Forbes Cellar Consent: Verbal consent obtained. Consent given by: patient Patient understanding: patient states understanding of the procedure being performed Patient consent: the patient's understanding of the procedure matches consent given Procedure consent: procedure consent matches procedure scheduled Patient identity confirmed: verbally with patient Body area: head/neck Location details: left cheek Laceration length: 2 cm Foreign bodies: no foreign bodies Tendon involvement: none Vascular damage: no Anesthesia: local infiltration Local anesthetic: lidocaine 1% without epinephrine Anesthetic total: 2 ml Patient sedated: no Irrigation solution: saline Amount of cleaning: standard Skin closure: 6-0  nylon Number of sutures: 4 Technique: simple Patient tolerance: Patient tolerated the procedure well with no immediate complications.    (including critical care time)  Labs Reviewed  PROTIME-INR - Abnormal; Notable for the following:    Prothrombin Time 34.8 (*)    INR 3.39 (*)    All other components within normal limits   Ct Head Wo Contrast  07/27/2011  *RADIOLOGY REPORT*  Clinical Data:  Status post fall  CT HEAD WITHOUT CONTRAST CT MAXILLOFACIAL WITHOUT CONTRAST  Technique:  Multidetector CT imaging of the head and maxillofacial structures were performed using the standard protocol without intravenous contrast. Multiplanar CT image reconstructions of the maxillofacial structures were also generated.  Comparison:   None.  CT HEAD  Findings: There is diffuse patchy low density throughout the subcortical and periventricular white matter consistent with chronic small vessel ischemic change.  There is prominence of the sulci and ventricles consistent with brain atrophy.  There is no evidence for acute brain infarct, hemorrhage or mass.  Calvarium is intact.  The mastoid air cells are clear.  IMPRESSION:  1.  No acute intracranial abnormalities. 2.  Small vessel ischemic change and brain atrophy.  CT MAXILLOFACIAL  Findings:   There is an incomplete left sided tripod fracture. Fracture deformities involving the floor of  the left orbit and the lateral wall of the left orbit.  Fractures also extend through the lateral wall of the left maxillary sinus and the anterior wall of the left maxillary sinus. The left zygomatic arch is intact.  A small amount of hemorrhage is noted within the floor of the left maxillary sinus.  Gas is identified within the preseptal soft tissues overlying the inferior aspect of left orbit.  IMPRESSION:  1.  Incomplete left sided tripod fracture.  Original Report Authenticated By: Rosealee Albee, M.D.   Ct Maxillofacial Wo Cm  07/27/2011  *RADIOLOGY REPORT*  Clinical Data:   Status post fall  CT HEAD WITHOUT CONTRAST CT MAXILLOFACIAL WITHOUT CONTRAST  Technique:  Multidetector CT imaging of the head and maxillofacial structures were performed using the standard protocol without intravenous contrast. Multiplanar CT image reconstructions of the maxillofacial structures were also generated.  Comparison:   None.  CT HEAD  Findings: There is diffuse patchy low density throughout the subcortical and periventricular white matter consistent with chronic small vessel ischemic change.  There is prominence of the sulci and ventricles consistent with brain atrophy.  There is no evidence for acute brain infarct, hemorrhage or mass.  Calvarium is intact.  The mastoid air cells are clear.  IMPRESSION:  1.  No acute intracranial abnormalities. 2.  Small vessel ischemic change and brain atrophy.  CT MAXILLOFACIAL  Findings:   There is an incomplete left sided tripod fracture. Fracture deformities involving the floor of the left orbit and the lateral wall of the left orbit.  Fractures also extend through the lateral wall of the left maxillary sinus and the anterior wall of the left maxillary sinus. The left zygomatic arch is intact.  A small amount of hemorrhage is noted within the floor of the left maxillary sinus.  Gas is identified within the preseptal soft tissues overlying the inferior aspect of left orbit.  IMPRESSION:  1.  Incomplete left sided tripod fracture.  Original Report Authenticated By: Rosealee Albee, M.D.    1. Facial fracture   2. Laceration   3. Skin tear   4. Fall    MDM  S/p mechanical fall, on coumadin with head trauma, skin tears/laceration. Tetanus UTD. CT head/MF, wound repair. Check INR.  Pt with fracture as above. No concern for entrapment at this time. Discussed with ENT Dr. Lazarus Salines. Recommends prophylactic abx, no nose blowing x 2 weeks. Pt given cefpodoxime as it should not interact with his coumadin. He denies augmentin allergy to me.  F/U in office next  week.  Visual acuities Bilateral Distance: 20/40 ; R Distance: 20/50 ; L Distance: 20/50      Forbes Cellar, MD 07/27/11 1642  Forbes Cellar, MD 07/27/11 [id number]

## 2011-07-27 NOTE — ED Notes (Signed)
Patient being sutured at this time.

## 2011-07-27 NOTE — ED Notes (Signed)
Patient denies pain and is resting comfortably.  

## 2011-07-27 NOTE — ED Notes (Signed)
Patient back form CT  

## 2011-07-29 ENCOUNTER — Emergency Department (INDEPENDENT_AMBULATORY_CARE_PROVIDER_SITE_OTHER)
Admission: EM | Admit: 2011-07-29 | Discharge: 2011-07-29 | Disposition: A | Discharge status: Home / Self Care | Payer: Medicare Other | Source: Home / Self Care | Attending: Family Medicine | Admitting: Family Medicine

## 2011-07-29 ENCOUNTER — Encounter (HOSPITAL_COMMUNITY): Payer: Self-pay | Admitting: *Deleted

## 2011-07-29 DIAGNOSIS — T148XXA Other injury of unspecified body region, initial encounter: Secondary | ICD-10-CM

## 2011-07-29 MED ORDER — BACITRACIN 500 UNIT/GM EX OINT: 1.0000 "application " | TOPICAL_OINTMENT | Freq: Once | CUTANEOUS | Status: DC

## 2011-07-29 NOTE — ED Notes (Signed)
Pt was seen in the ED on Saturday after a fall at his home.  He is here today because he is having pain to left hand just below little finger.  Pt has stitches in left hand due to the fall.  Pt has numerous contusions and sutured lacerations on left side of face and hand and arm.

## 2011-07-29 NOTE — ED Provider Notes (Addendum)
History     CSN: [id number]  Arrival date & time 07/29/11  1114   First MD Initiated Contact with Patient 07/29/11 1203      Chief Complaint  Patient presents with  . Hand Pain    (Consider location/radiation/quality/duration/timing/severity/associated sxs/prior treatment) HPI Comments: The patient states he is here for wound checks. He apparently fell on the 30th and was eval in the ed. Has sutures periorbitally and left hand. Has steri strips on left elbow. All appear to be healing well. Denies any complications.   The history is provided by the patient.    Past Medical History  Diagnosis Date  . PAF (paroxysmal atrial fibrillation)     Managed with rate control and coumadin  . COPD (chronic obstructive pulmonary disease)   . HTN (hypertension)   . Hyperlipemia   . OA (osteoarthritis)   . Chronic anticoagulation   . Incomplete rotator cuff tear or rupture of right shoulder, not specified as traumatic     Past Surgical History  Procedure Date  . Total hip arthroplasty 1998  . Shoulder arthroscopy 1994    RIGHT  . Cholecystectomy   . Appendectomy   . Cataract extraction w/ intraocular lens  implant, bilateral     Family History  Problem Relation Age of Onset  . Cancer Father   . Cancer Mother   . Other Brother     SUICIDE    History  Substance Use Topics  . Smoking status: Current Some Day Smoker    Types: Pipe  . Smokeless tobacco: Not on file  . Alcohol Use: No      Review of Systems  Constitutional: Negative.   HENT: Negative.   Eyes: Negative.   Respiratory: Negative.   Cardiovascular: Negative.   Gastrointestinal: Negative.   Genitourinary: Negative.   Neurological: Negative.     Allergies  Advair diskus; Aspirin; Augmentin; Dilaudid; and Morphine and related  Home Medications   Current Outpatient Rx  Name Route Sig Dispense Refill  . ACETAMINOPHEN 500 MG PO TABS Oral Take 500 mg by mouth every 6 (six) hours as needed. For pain    .  UROXATRAL PO Oral Take 10 mg by mouth 2 (two) times daily.      . ATENOLOL 50 MG PO TABS Oral Take 0.5 tablets (25 mg total) by mouth daily.    . ATORVASTATIN CALCIUM 10 MG PO TABS Oral Take 10 mg by mouth daily.      . CEFPODOXIME PROXETIL 200 MG PO TABS Oral Take 2 tablets (400 mg total) by mouth 2 (two) times daily. 40 tablet 0  . DIGOXIN 0.125 MG PO TABS Oral Take 1 tablet (125 mcg total) by mouth daily. 90 tablet 3  . OCUVITE PO Oral Take by mouth daily.      . OXYCODONE HCL 5 MG PO TABS Oral Take 5 mg by mouth every 4 (four) hours as needed. For pain    . TRIAMTERENE-HCTZ 37.5-25 MG PO TABS Oral Take 0.5 tablets by mouth daily.    . WARFARIN SODIUM 5 MG PO TABS Oral Take 5-7.5 mg by mouth daily at 6 PM. Take 1 tablet in Sun, tues, Fri. Take 1.5 tabelt on Wed, thrus, Sat, Mon      BP 115/74  Pulse 66  Temp(Src) 98.5 F (36.9 C) (Oral)  Resp 20  SpO2 95%  Physical Exam  Nursing note and vitals reviewed. Constitutional: He appears well-developed and well-nourished. No distress.  Cardiovascular: Normal rate.  Hx of a fib and on coumadin  Pulmonary/Chest: Effort normal and breath sounds normal.  Skin:       Lacerations left periorbital area, left hand and forearm appear to be healing well. No signs of infection.     ED Course  Procedures (including critical care time)  Labs Reviewed - No data to display No results found.   1. Multiple lacerations       MDM          Randa Spike, MD 07/29/11 1419  Randa Spike, MD 07/29/11 1420

## 2011-07-29 NOTE — Discharge Instructions (Signed)
Keep the areas clean. Ok to shower. Apply antibiotic ointment to sutured areas and cover. Do not apply antibiotic to steri stripped areas. Follow up with your pcp or return in 7 days for suture removal. Sooner if any complications

## 2011-07-31 ENCOUNTER — Other Ambulatory Visit: Payer: Self-pay | Admitting: Orthopedic Surgery

## 2011-07-31 DIAGNOSIS — M25511 Pain in right shoulder: Secondary | ICD-10-CM

## 2011-08-01 ENCOUNTER — Ambulatory Visit
Admission: RE | Admit: 2011-08-01 | Discharge: 2011-08-01 | Disposition: A | Discharge status: Home / Self Care | Payer: Medicare Other | Source: Ambulatory Visit | Attending: Orthopedic Surgery | Admitting: Orthopedic Surgery

## 2011-08-01 DIAGNOSIS — M25511 Pain in right shoulder: Secondary | ICD-10-CM

## 2011-08-04 ENCOUNTER — Encounter (HOSPITAL_COMMUNITY): Payer: Self-pay | Admitting: *Deleted

## 2011-08-04 ENCOUNTER — Emergency Department (INDEPENDENT_AMBULATORY_CARE_PROVIDER_SITE_OTHER)
Admission: EM | Admit: 2011-08-04 | Discharge: 2011-08-04 | Disposition: A | Discharge status: Home / Self Care | Payer: Medicare Other | Source: Home / Self Care | Attending: Emergency Medicine | Admitting: Emergency Medicine

## 2011-08-04 DIAGNOSIS — Z4802 Encounter for removal of sutures: Secondary | ICD-10-CM

## 2011-08-04 NOTE — ED Notes (Signed)
HERE FOR SUTURE REMOVAL FROM LEFT EYELID, LEFT CHEEK AND LEFT HAND

## 2011-08-04 NOTE — ED Provider Notes (Addendum)
History     CSN: [id number]  Arrival date & time 08/04/11  1325   First MD Initiated Contact with Patient 08/04/11 1329      Chief Complaint  Patient presents with  . Suture / Staple Removal    (Consider location/radiation/quality/duration/timing/severity/associated sxs/prior treatment) HPI Comments: Patient returns here for suture removal is has been approximately 9 days since he sustained his original fall. He is undergoing using prescribe antibiotics and has been taking care of is warm. No increased tenderness redness, warmth or any active discharge.  The history is provided by the patient and the spouse.    Past Medical History  Diagnosis Date  . PAF (paroxysmal atrial fibrillation)     Managed with rate control and coumadin  . COPD (chronic obstructive pulmonary disease)   . HTN (hypertension)   . Hyperlipemia   . OA (osteoarthritis)   . Chronic anticoagulation   . Incomplete rotator cuff tear or rupture of right shoulder, not specified as traumatic     Past Surgical History  Procedure Date  . Total hip arthroplasty 1998  . Shoulder arthroscopy 1994    RIGHT  . Cholecystectomy   . Appendectomy   . Cataract extraction w/ intraocular lens  implant, bilateral     Family History  Problem Relation Age of Onset  . Cancer Father   . Cancer Mother   . Other Brother     SUICIDE    History  Substance Use Topics  . Smoking status: Current Some Day Smoker    Types: Pipe  . Smokeless tobacco: Not on file  . Alcohol Use: No      Review of Systems  Constitutional: Negative for chills and appetite change.  HENT: Positive for facial swelling. Negative for neck pain and neck stiffness.   Skin: Positive for wound.    Allergies  Advair diskus; Aspirin; Augmentin; Dilaudid; and Morphine and related  Home Medications   Current Outpatient Rx  Name Route Sig Dispense Refill  . ACETAMINOPHEN 500 MG PO TABS Oral Take 500 mg by mouth every 6 (six) hours as needed.  For pain    . UROXATRAL PO Oral Take 10 mg by mouth 2 (two) times daily.      . ATENOLOL 50 MG PO TABS Oral Take 0.5 tablets (25 mg total) by mouth daily.    . ATORVASTATIN CALCIUM 10 MG PO TABS Oral Take 10 mg by mouth daily.      . CEFPODOXIME PROXETIL 200 MG PO TABS Oral Take 2 tablets (400 mg total) by mouth 2 (two) times daily. 40 tablet 0  . DIGOXIN 0.125 MG PO TABS Oral Take 1 tablet (125 mcg total) by mouth daily. 90 tablet 3  . OCUVITE PO Oral Take by mouth daily.      . OXYCODONE HCL 5 MG PO TABS Oral Take 5 mg by mouth every 4 (four) hours as needed. For pain    . TRIAMTERENE-HCTZ 37.5-25 MG PO TABS Oral Take 0.5 tablets by mouth daily.    . WARFARIN SODIUM 5 MG PO TABS Oral Take 5-7.5 mg by mouth daily at 6 PM. Take 1 tablet in Sun, tues, Fri. Take 1.5 tabelt on Wed, thrus, Sat, Mon      BP 117/79  Pulse 57  Temp(Src) 98.4 F (36.9 C) (Oral)  Resp 16  SpO2 94%  Physical Exam  Nursing note and vitals reviewed. Constitutional: No distress.  HENT:  Head:    Abdominal: Soft.  Neurological: He is alert.  Skin: No rash noted. No erythema.       ED Course  Procedures (including critical care time)  Labs Reviewed - No data to display No results found.   1. Visit for suture removal       MDM  Patient returns today to have sutures removed. We addressed that during his first encounter in the emergency department where he was informed about his orbital fractures he was recommended to followup with Dr. Lazarus Salines keep outpatient after a week. Patient is no significant changes normal extraocular movements in all soft tissue wounds are healing fine with no signs of secondary infection.        Jimmie Molly, MD 08/04/11 1513  Jimmie Molly, MD 08/04/11 1515

## 2011-08-04 NOTE — Discharge Instructions (Signed)
Followup with ENT provider as previously instructed in the emergency department for followup of your facial fractures. Your wounds and healing process seemed to be going well.

## 2011-08-07 ENCOUNTER — Encounter: Payer: Self-pay | Admitting: Nurse Practitioner

## 2011-08-07 ENCOUNTER — Ambulatory Visit (INDEPENDENT_AMBULATORY_CARE_PROVIDER_SITE_OTHER): Payer: Medicare Other | Admitting: Nurse Practitioner

## 2011-08-07 VITALS — BP 90/48 | HR 52 | Ht 73.0 in | Wt 170.0 lb

## 2011-08-07 DIAGNOSIS — Z7901 Long term (current) use of anticoagulants: Secondary | ICD-10-CM

## 2011-08-07 DIAGNOSIS — I4891 Unspecified atrial fibrillation: Secondary | ICD-10-CM

## 2011-08-07 DIAGNOSIS — Z01818 Encounter for other preprocedural examination: Secondary | ICD-10-CM

## 2011-08-07 NOTE — Assessment & Plan Note (Signed)
Patient presents for surgical clearance for right shoulder surgery with Dr. Dion Saucier. He is having no cardiac issues. Has no valvular heart disease. He does have a history of PAF and is on coumadin. Currently in sinus today. He will be seeing Dr. Craige Cotta for his pulmonary issues but these seem stable as well. His last stress test was 11 months ago and was satisfactory. His EF is normal. He should be an acceptable candidate for surgery.

## 2011-08-07 NOTE — Progress Notes (Signed)
Kyle [name] Date of Birth: [birth year] Medical Record #[id number]  History of Present Illness: Kyle [name] is seen today for a surgical clearance visit. He is seen for Kyle [name]. He is a former patient of Kyle [name]. He has not been seen since last May. He has COPD, PAF, on chronic coumadin, HTN, HLD, and OA. His last stress test was in May of 2012. This was felt to probably be normal with small mild defects consistent with inferoseptal and apical thinning but no ischemia. EF was not gated. Last echo in 2011 with an EF of 50 to 55% with mild LVH and mild MR and RAE.   He comes in today. He is thinking about having a right shoulder replacement. He tripped on some steps 6 months ago and landed on his shoulder. He has failed with conservative management. 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 He suffered significant laceration to the face and left arm as well as left orbital fractures. He has had no syncope. He has not been dizzy or lightheaded. He denies chest pain. He remains active. Is planning on cutting his grass later today. His shortness of breath is at his baseline. No swelling/PND/orthopnea reported. Blood pressure has been doing well at home. He does not know how his rhythm has been. He has no awareness of his atrial fib. He does remain on coumadin. He admits that he feels a little unsteady but is trying to be very cautious. He does use a cane.    Current Outpatient Prescriptions on File Prior to Visit  Medication Sig Dispense Refill  . acetaminophen (TYLENOL) 500 MG tablet Take 500 mg by mouth every 6 (six) hours as needed. For pain      . Alfuzosin HCl (UROXATRAL PO) Take 10 mg by mouth 2 (two) times daily.        Marland Kitchen atenolol (TENORMIN) 50 MG tablet Take 0.5 tablets (25 mg total) by mouth daily.      Marland Kitchen atorvastatin (LIPITOR) 10 MG tablet Take 10 mg by mouth daily.        . digoxin (LANOXIN) 0.125 MG tablet  Take 1 tablet (125 mcg total) by mouth daily.  90 tablet  3  . Multiple Vitamins-Minerals (OCUVITE PO) Take by mouth daily.        Marland Kitchen oxyCODONE (OXY IR/ROXICODONE) 5 MG immediate release tablet Take 5 mg by mouth every 4 (four) hours as needed. For pain      . triamterene-hydrochlorothiazide (MAXZIDE-25) 37.5-25 MG per tablet Take 0.5 tablets by mouth daily.      Marland Kitchen warfarin (COUMADIN) 5 MG tablet Take 5-7.5 mg by mouth daily at 6 PM. Take 1 tablet in Sun, tues, Fri. Take 1.5 tabelt on Wed, thrus, Sat, Mon      . cefpodoxime (VANTIN) 200 MG tablet Take 2 tablets (400 mg total) by mouth 2 (two) times daily.  40 tablet  0    Allergies  Allergen Reactions  . Advair Diskus (Fluticasone-Salmeterol)   . Aspirin     Bruising  . Augmentin   . Dilaudid (Hydromorphone Hcl)   . Morphine And Related     Past Medical History  Diagnosis Date  . PAF (paroxysmal atrial fibrillation)     Managed with rate control and coumadin  . COPD (chronic obstructive pulmonary disease)   . HTN (hypertension)   . Hyperlipemia   . OA (osteoarthritis)   . Chronic anticoagulation   .  Incomplete rotator cuff tear or rupture of right shoulder, not specified as traumatic 2012  . Chronic anticoagulation     Past Surgical History  Procedure Date  . Total hip arthroplasty 1998  . Shoulder arthroscopy 1994    RIGHT  . Cholecystectomy   . Appendectomy   . Cataract extraction w/ intraocular lens  implant, bilateral     History  Smoking status  . Current Some Day Smoker  . Types: Pipe  Smokeless tobacco  . Not on file    History  Alcohol Use No    Family History  Problem Relation Age of Onset  . Cancer Father   . Cancer Mother   . Other Brother     SUICIDE    Review of Systems: The review of systems is per the HPI.  All other systems were reviewed and are negative.  Physical Exam: BP 90/48  Pulse 52  Ht 6\' 1"  (1.854 m)  Wt 170 lb (77.111 kg)  BMI 22.43 kg/m2 Patient is very pleasant and in no  acute distress. Skin is warm and dry. Color is normal.  HEENT is unremarkable except for healing laceration over the left eye. Normocephalic/atraumatic. PERRL. Sclera are nonicteric. Neck is supple. No masses. No JVD. Lungs are clear. Cardiac exam shows a regular rate and rhythm today. Abdomen is soft. Extremities are without edema. Gait and ROM are intact. He is using a cane. No gross neurologic deficits noted.   LABORATORY DATA: EKG today shows sinus bradycardia. Rate is 52.   Assessment / Plan:

## 2011-08-07 NOTE — Patient Instructions (Addendum)
I think you are doing well from our standpoint.   I think you are an acceptable candidate for your shoulder surgery.  Dr. Jens Som will see you in 6 months  Call the [facility] office at [phone] if you have any questions, problems or concerns.

## 2011-08-07 NOTE — Assessment & Plan Note (Signed)
Patient does remain on coumadin. This is followed by Dr. Waynard Edwards. He has had a fall 6 months ago from getting tripped up and has had another incident last week where his right shoulder gave away with subsequent orbital fracture and lacerations. He is not having any dizzy or lightheaded spells. No syncope. He does admit to being unsteady. I have explained to him that if his falls continue, we will need to readdress his coumadin. Patient is agreeable to this plan and will call if any problems develop in the interim.

## 2011-08-14 ENCOUNTER — Ambulatory Visit (INDEPENDENT_AMBULATORY_CARE_PROVIDER_SITE_OTHER)
Admission: RE | Admit: 2011-08-14 | Discharge: 2011-08-14 | Disposition: A | Discharge status: Home / Self Care | Payer: Medicare Other | Source: Ambulatory Visit | Attending: Pulmonary Disease | Admitting: Pulmonary Disease

## 2011-08-14 ENCOUNTER — Telehealth: Payer: Self-pay | Admitting: Pulmonary Disease

## 2011-08-14 ENCOUNTER — Encounter: Payer: Self-pay | Admitting: Pulmonary Disease

## 2011-08-14 ENCOUNTER — Ambulatory Visit (INDEPENDENT_AMBULATORY_CARE_PROVIDER_SITE_OTHER): Payer: Medicare Other | Admitting: Pulmonary Disease

## 2011-08-14 VITALS — BP 104/52 | HR 52 | Temp 98.2°F | Ht 73.0 in | Wt 170.2 lb

## 2011-08-14 DIAGNOSIS — J449 Chronic obstructive pulmonary disease, unspecified: Secondary | ICD-10-CM

## 2011-08-14 DIAGNOSIS — Z01811 Encounter for preprocedural respiratory examination: Secondary | ICD-10-CM

## 2011-08-14 NOTE — Assessment & Plan Note (Signed)
He is being evaluated for right shoulder replacement.  He has moderate COPD, but is well maintained on nebulizer therapy with duoneb and does not have much functional limitation related to his breathing.  He can proceed with surgery.  I explained that he would be at increased risk of peri-operative respiratory complications mostly related to potential need for prolonged mechanical ventilatory support.  He should continue with his nebulizer regimen in the peri-operative period.  He should have close monitoring of his oxygenation.  Pulmonary team can be available as needed in the post-operative period.

## 2011-08-14 NOTE — Assessment & Plan Note (Addendum)
He has moderate obstruction on spirometry today.  He has expected changes of COPD on chest xray, but no other acute process.  His symptoms are minimal at present.  His oxygenation is well maintained.  I advised that he can continue duoneb.  I explained that he does not need to use this on a scheduled basis if he is not having difficulty with his breathing.  He will try to limit his use of nebulizer medicine and monitor his symptom status.  I advised him to call back if he develops trouble with decrease in use of nebulizer medicine.  Of note is that he is no longer using flovent.

## 2011-08-14 NOTE — Progress Notes (Signed)
Chief Complaint  Patient presents with  . Advice Only    pt here bc he needs pulomonary clearance for right shoulder surgery Dr. Oval Linsey scheduled yet. denies any sob, cough, wheezing, chest tx    History of Present Illness: Kyle [name] is a 78 y.o. male for evaluation of COPD.  He is being evaluated by Dr. Dion Saucier for possible right shoulder replacement surgery.  He has a history of COPD.  As a result pulmonary consultation was requested to evaluate his fitness for surgery.   He has an extensive prior history of smoking, and still smokes a pipe.  He has been using duoneb for some time.  He uses this twice per day, but more as a matter of routine.  He is not sure if he really needs to use his nebulizer that often.  He was previously on flovent, but has since stopped this.  He did not notice any more trouble with his breathing without flovent.  He currently does not have trouble with cough, sputum, chest congestion, or wheeze.  He has lost about 20 lbs, but is unsure why he has been losing weight.  He does not feel his breathing limits the activities he wants to do.  He has not needed to use supplemental oxygen.  He denies leg swelling.  He is retired.  He has not had recent travel.  There is no prior history of pneumonia or tuberculosis.  He does not have significant animal exposures.   Past Medical History  Diagnosis Date  . PAF (paroxysmal atrial fibrillation)     Managed with rate control and coumadin  . COPD (chronic obstructive pulmonary disease)   . HTN (hypertension)   . Hyperlipemia   . OA (osteoarthritis)   . Chronic anticoagulation   . Incomplete rotator cuff tear or rupture of right shoulder, not specified as traumatic 2012  . Chronic anticoagulation     Past Surgical History  Procedure Date  . Total hip arthroplasty 1998  . Shoulder arthroscopy 1994    RIGHT  . Cholecystectomy   . Appendectomy   . Cataract extraction w/ intraocular lens  implant, bilateral   .  Right knee arthroscopy     Current Outpatient Prescriptions on File Prior to Visit  Medication Sig Dispense Refill  . acetaminophen (TYLENOL) 500 MG tablet Take 500 mg by mouth every 6 (six) hours as needed. For pain      . Alfuzosin HCl (UROXATRAL PO) Take 10 mg by mouth 2 (two) times daily.        Marland Kitchen atenolol (TENORMIN) 50 MG tablet Take 0.5 tablets (25 mg total) by mouth daily.      Marland Kitchen atorvastatin (LIPITOR) 10 MG tablet Take 10 mg by mouth daily.        . digoxin (LANOXIN) 0.125 MG tablet Take 1 tablet (125 mcg total) by mouth daily.  90 tablet  3  . oxyCODONE (OXY IR/ROXICODONE) 5 MG immediate release tablet Take 5 mg by mouth every 4 (four) hours as needed. For pain      . triamterene-hydrochlorothiazide (MAXZIDE-25) 37.5-25 MG per tablet Take 0.5 tablets by mouth daily.      Marland Kitchen warfarin (COUMADIN) 5 MG tablet Take 5-7.5 mg by mouth daily at 6 PM. Take 1 tablet in Sun, tues, Fri. Take 1.5 tabelt on Wed, thrus, Sat, Mon        Allergies  Allergen Reactions  . Advair Diskus (Fluticasone-Salmeterol)   . Dilaudid (Hydromorphone Hcl)   . Morphine And Related  family history includes Cancer in his father and mother and Other in his brother.   reports that he has been smoking Pipe.  He does not have any smokeless tobacco history on file. He reports that he does not drink alcohol or use illicit drugs.  Review of Systems   Constitutional: Positive for appetite change and unexpected weight change.  HENT: Negative for ear pain, congestion, sore throat, trouble swallowing and dental problem.   Respiratory: Negative for cough and shortness of breath.   Cardiovascular: Positive for palpitations. Negative for chest pain and leg swelling.  Gastrointestinal: Negative for abdominal pain.  Musculoskeletal: Positive for arthralgias. Negative for joint swelling.  Skin: Negative for rash.  Neurological: Negative for headaches.  Psychiatric/Behavioral: Negative for dysphoric mood. The patient is  not nervous/anxious.     Physical Exam: BP 104/52  Pulse 52  Temp(Src) 98.2 F (36.8 C) (Oral)  Ht 6\' 1"  (1.854 m)  Wt 170 lb 3.2 oz (77.202 kg)  BMI 22.46 kg/m2  SpO2 94% Body mass index is 22.46 kg/(m^2).  General - Thin, speaks in full sentences, some difficulty with hearing HEENT - PERRLA, EOMI, wears glasses, no sinus tenderness, no oral exudate, no LAN Cardiac - s1s2 irregular, no murmur  Chest - good air entry, normal respiratory excursion, no wheeze/rales Abdomen - soft, nontender, normal bowel sounds Extremities - no e/c/c Neurologic - normal strength, ambulates with cane Skin - no rashes Psychiatric - normal mood, behavior   Dg Chest 2 View  08/14/2011  *RADIOLOGY REPORT*  Clinical Data: COPD.  Smoker  CHEST - 2 VIEW  Comparison: 09/17/2010  Findings: Heart size appears normal.  No pleural effusion or pulmonary edema.  No airspace consolidation identified.  Calcified granuloma is noted within the left midlung.  The lungs appear hyperinflated.  There are coarsened interstitial markings noted bilaterally.  There is no airspace consolidation identified.  IMPRESSION:  1.  Left midlung granuloma. 2.  No acute findings.  Original Report Authenticated By: Rosealee Albee, M.D.    Spirometry 08/14/11>>FEV1 1.90 (55%), FEV1% 50  Assessment/Plan:  Outpatient Encounter Prescriptions as of 08/14/2011  Medication Sig Dispense Refill  . acetaminophen (TYLENOL) 500 MG tablet Take 500 mg by mouth every 6 (six) hours as needed. For pain      . Alfuzosin HCl (UROXATRAL PO) Take 10 mg by mouth 2 (two) times daily.        Marland Kitchen atenolol (TENORMIN) 50 MG tablet Take 0.5 tablets (25 mg total) by mouth daily.      Marland Kitchen atorvastatin (LIPITOR) 10 MG tablet Take 10 mg by mouth daily.        . digoxin (LANOXIN) 0.125 MG tablet Take 1 tablet (125 mcg total) by mouth daily.  90 tablet  3  . oxyCODONE (OXY IR/ROXICODONE) 5 MG immediate release tablet Take 5 mg by mouth every 4 (four) hours as needed.  For pain      . triamterene-hydrochlorothiazide (MAXZIDE-25) 37.5-25 MG per tablet Take 0.5 tablets by mouth daily.      Marland Kitchen warfarin (COUMADIN) 5 MG tablet Take 5-7.5 mg by mouth daily at 6 PM. Take 1 tablet in Sun, tues, Fri. Take 1.5 tabelt on Wed, thrus, Sat, Mon      . DISCONTD: Multiple Vitamins-Minerals (OCUVITE PO) Take by mouth daily.          Anyia Gierke Pager:  [phone] 08/14/2011, 11:48 AM

## 2011-08-14 NOTE — Progress Notes (Deleted)
  Subjective:    Patient ID: Kyle [name], male    DOB: February 04, 1927, 78 y.o.   MRN: [id number]  HPI    Review of Systems  Constitutional: Positive for appetite change and unexpected weight change.  HENT: Negative for ear pain, congestion, sore throat, trouble swallowing and dental problem.   Respiratory: Negative for cough and shortness of breath.   Cardiovascular: Positive for palpitations. Negative for chest pain and leg swelling.  Gastrointestinal: Negative for abdominal pain.  Musculoskeletal: Positive for arthralgias. Negative for joint swelling.  Skin: Negative for rash.  Neurological: Negative for headaches.  Psychiatric/Behavioral: Negative for dysphoric mood. The patient is not nervous/anxious.        Objective:   Physical Exam        Assessment & Plan:

## 2011-08-14 NOTE — Patient Instructions (Signed)
Spirometry and Chest xray today>>will call with results Follow up in 6 months

## 2011-08-14 NOTE — Telephone Encounter (Signed)
Dg Chest 2 View  08/14/2011  *RADIOLOGY REPORT*  Clinical Data: COPD.  Smoker  CHEST - 2 VIEW  Comparison: 09/17/2010  Findings: Heart size appears normal.  No pleural effusion or pulmonary edema.  No airspace consolidation identified.  Calcified granuloma is noted within the left midlung.  The lungs appear hyperinflated.  There are coarsened interstitial markings noted bilaterally.  There is no airspace consolidation identified.  IMPRESSION:  1.  Left midlung granuloma. 2.  No acute findings.  Original Report Authenticated By: Rosealee Albee, M.D.    Spirometry 08/14/11>>FEV1 1.90 (55%), FEV1% 50.   Will have my nurse inform patient that spirometry showed moderate COPD, and chest xray showed expected changes from COPD.  No change to current treatment plan.  He can proceed with shoulder replacement surgery if needed.  I have faxed reports to Dr. Waynard Edwards and Dr. Dion Saucier.

## 2011-08-15 NOTE — Telephone Encounter (Signed)
I spoke with patient about results and he verbalized understanding and had no questions 

## 2011-08-15 NOTE — Telephone Encounter (Signed)
Pt returned call. Kyle [name]  

## 2011-08-15 NOTE — Telephone Encounter (Signed)
[id number] x1 

## 2011-08-15 NOTE — Telephone Encounter (Signed)
Pt returned Mindy's call & asked to be reached at [phone].  Kyle [name]

## 2011-08-27 ENCOUNTER — Other Ambulatory Visit: Payer: Self-pay | Admitting: Orthopedic Surgery

## 2011-09-09 ENCOUNTER — Other Ambulatory Visit (HOSPITAL_COMMUNITY): Payer: Medicare Other

## 2011-09-17 ENCOUNTER — Ambulatory Visit (HOSPITAL_COMMUNITY): Admission: RE | Admit: 2011-09-17 | Payer: Medicare Other | Source: Ambulatory Visit | Admitting: Orthopedic Surgery

## 2011-09-17 ENCOUNTER — Encounter (HOSPITAL_COMMUNITY): Admission: RE | Payer: Self-pay | Source: Ambulatory Visit

## 2011-09-17 SURGERY — ARTHROPLASTY, SHOULDER, TOTAL
Anesthesia: General | Laterality: Right

## 2012-02-13 ENCOUNTER — Ambulatory Visit: Payer: Medicare Other | Admitting: Pulmonary Disease

## 2012-02-27 ENCOUNTER — Ambulatory Visit: Payer: Medicare Other | Admitting: Nurse Practitioner

## 2012-02-28 ENCOUNTER — Encounter: Payer: Self-pay | Admitting: Nurse Practitioner

## 2012-02-28 ENCOUNTER — Ambulatory Visit (INDEPENDENT_AMBULATORY_CARE_PROVIDER_SITE_OTHER): Payer: Medicare Other | Admitting: Nurse Practitioner

## 2012-02-28 VITALS — BP 110/62 | HR 56 | Ht 73.0 in | Wt 164.4 lb

## 2012-02-28 DIAGNOSIS — I4891 Unspecified atrial fibrillation: Secondary | ICD-10-CM

## 2012-02-28 DIAGNOSIS — Z01818 Encounter for other preprocedural examination: Secondary | ICD-10-CM

## 2012-02-28 NOTE — Patient Instructions (Signed)
It is ok to schedule your surgery for your hip replacement.  I will send Dr. Dion Saucier and Dr. Waynard Edwards a note on you today  Stay on your current medicines  Call the [facility] office at [phone] if you have any questions, problems or concerns.

## 2012-02-28 NOTE — Progress Notes (Signed)
Horrace [name] Date of Birth: 1926/07/24 Medical Record #[id number]  History of Present Illness: Kyle [name] is seen today for a pre op visit. He is seen for Dr. Jens Som. He has no known CAD. He has COPD, PAF, on coumadin, HTN, HLD and OA. Last stress study was in May of 2012 and was felt to be normal with small mild defects consistent with inferoseptal and apical thinning but no ischemia. Study was not gated. Last echo in 2011 showed an EF of 50 to 55% with mild LVH with mild MR and RAE.  He has chronic bradycardia and has been asymptomatic. He has had remote left hip replacement about 15 years ago.   He is seen today. He is here with his wife. He is planning on a right hip replacement. This has not been scheduled. He is doing well from our standpoint. No chest pain. Not short of breath. Not dizzy or lightheaded. No syncope. Trying to be active but limited by his hip. He never got his shoulder fixed from a past fall. York Spaniel he has just mild limitation in that regards. No longer seeing pulmonary. Overall, he thinks he has been doing ok. Dr. Waynard Edwards follows his labs and his coumadin. His biggest issue is his right hip pain and nocturia.   Current Outpatient Prescriptions on File Prior to Visit  Medication Sig Dispense Refill  . Alfuzosin HCl (UROXATRAL PO) Take 10 mg by mouth 2 (two) times daily.        Marland Kitchen atenolol (TENORMIN) 50 MG tablet Take 0.5 tablets (25 mg total) by mouth daily.      Marland Kitchen atorvastatin (LIPITOR) 10 MG tablet Take 10 mg by mouth daily.        . digoxin (LANOXIN) 0.125 MG tablet Take 1 tablet (125 mcg total) by mouth daily.  90 tablet  3  . triamterene-hydrochlorothiazide (MAXZIDE-25) 37.5-25 MG per tablet Take 0.5 tablets by mouth daily.      Marland Kitchen warfarin (COUMADIN) 5 MG tablet Take 5-7.5 mg by mouth daily at 6 PM. Take 1 tablet in Sun, tues, Fri. Take 1.5 tabelt on Wed, thrus, Sat, Mon        Allergies  Allergen Reactions  . Advair Diskus (Fluticasone-Salmeterol)   . Dilaudid (Hydromorphone  Hcl)   . Morphine And Related     Past Medical History  Diagnosis Date  . PAF (paroxysmal atrial fibrillation)     Managed with rate control and coumadin  . COPD (chronic obstructive pulmonary disease)   . HTN (hypertension)   . Hyperlipemia   . OA (osteoarthritis)   . Chronic anticoagulation   . Incomplete rotator cuff tear or rupture of right shoulder, not specified as traumatic 2012  . Chronic anticoagulation     Past Surgical History  Procedure Date  . Total hip arthroplasty 1998  . Shoulder arthroscopy 1994    RIGHT  . Cholecystectomy   . Appendectomy   . Cataract extraction w/ intraocular lens  implant, bilateral   . Right knee arthroscopy     History  Smoking status  . Current Some Day Smoker -- 44 years  . Types: Pipe  Smokeless tobacco  . Not on file  Comment: Smoked cigarettes early age socially    History  Alcohol Use No    Family History  Problem Relation Age of Onset  . Cancer Father   . Cancer Mother   . Other Brother     SUICIDE    Review of Systems: The review of systems is per  the HPI.  All other systems were reviewed and are negative.  Physical Exam: BP 110/62  Pulse 56  Ht 6\' 1"  (1.854 m)  Wt 164 lb 6.4 oz (74.571 kg)  BMI 21.69 kg/m2 Patient is very pleasant and in no acute distress. Skin is warm and dry. Color is normal.  HEENT is unremarkable. Normocephalic/atraumatic. PERRL. Sclera are nonicteric. Neck is supple. No masses. No JVD. Lungs are clear. Cardiac exam shows a fairly regular rate and rhythm today. Abdomen is soft. Extremities are without edema. Gait and ROM are intact. No gross neurologic deficits noted.   LABORATORY DATA:  EKG today shows sinus bradycardia. Rate is 47.    Lab Results  Component Value Date   WBC 7.9 09/17/2010   HGB 12.9* 09/17/2010   HCT 38.0* 09/17/2010   PLT 246.0 09/17/2010   GLUCOSE 78 09/17/2010   ALT 13 09/17/2010   AST 18 09/17/2010   NA 139 09/17/2010   K 4.2 09/17/2010   CL 103 09/17/2010    CREATININE 1.3 09/17/2010   BUN 25* 09/17/2010   CO2 28 09/17/2010   TSH 1.52 09/17/2010   INR 3.39* 07/27/2011    Assessment / Plan:  1 Pre op clearance - wanting THR - He is not having any active cardiac complaints at this time and is felt to be doing well from our standpoint. He is felt to be an acceptable risk for his upcoming THR from our standpoint. He does have significant COPD and has seen Dr. Craige Cotta in the past.  2. Bradycardia - he is totally asymptomatic. I have talked with Dr. Patty Sermons and reviewed his EKG today with Dr. Patty Sermons. The patient is not symptomatic. We have left him on his current medicines including his beta blocker therapy in light of his upcoming surgery.   3. HTN - blood pressure looks good.   4. PAF - in sinus today.   5. Chronic coumadin - followed by Dr. Waynard Edwards.  Patient is agreeable to this plan and will call if any problems develop in the interim.

## 2012-03-16 ENCOUNTER — Other Ambulatory Visit: Payer: Self-pay | Admitting: Orthopedic Surgery

## 2012-03-19 ENCOUNTER — Encounter (HOSPITAL_COMMUNITY): Payer: Self-pay | Admitting: Respiratory Therapy

## 2012-03-24 ENCOUNTER — Encounter (HOSPITAL_COMMUNITY): Payer: Self-pay

## 2012-03-24 ENCOUNTER — Ambulatory Visit (HOSPITAL_COMMUNITY): Admission: RE | Admit: 2012-03-24 | Payer: Medicare Other | Source: Ambulatory Visit

## 2012-03-24 ENCOUNTER — Encounter (HOSPITAL_COMMUNITY)
Admission: RE | Admit: 2012-03-24 | Discharge: 2012-03-24 | Disposition: A | Discharge status: Home / Self Care | Payer: Medicare Other | Source: Ambulatory Visit | Attending: Orthopedic Surgery | Admitting: Orthopedic Surgery

## 2012-03-24 HISTORY — DX: Nocturia: R35.1

## 2012-03-24 LAB — URINALYSIS, ROUTINE W REFLEX MICROSCOPIC
Glucose, UA: NEGATIVE mg/dL
Hgb urine dipstick: NEGATIVE
Ketones, ur: NEGATIVE mg/dL
Leukocytes, UA: NEGATIVE
Protein, ur: NEGATIVE mg/dL
Urobilinogen, UA: 0.2 mg/dL (ref 0.0–1.0)

## 2012-03-24 LAB — BASIC METABOLIC PANEL
Calcium: 9.6 mg/dL (ref 8.4–10.5)
GFR calc Af Amer: 60 mL/min — ABNORMAL LOW (ref >90)
GFR calc non Af Amer: 52 mL/min — ABNORMAL LOW (ref >90)
Glucose, Bld: 101 mg/dL — ABNORMAL HIGH (ref 70–99)
Potassium: 4.2 mEq/L (ref 3.5–5.1)
Sodium: 138 mEq/L (ref 135–145)

## 2012-03-24 LAB — CBC
Hemoglobin: 12.8 g/dL — ABNORMAL LOW (ref 13.0–17.0)
MCH: 30.5 pg (ref 26.0–34.0)
Platelets: 299 10*3/uL (ref 150–400)
RBC: 4.2 MIL/uL — ABNORMAL LOW (ref 4.22–5.81)

## 2012-03-24 LAB — TYPE AND SCREEN
ABO/RH(D): A POS
Antibody Screen: NEGATIVE

## 2012-03-24 LAB — SURGICAL PCR SCREEN
MRSA, PCR: NEGATIVE
Staphylococcus aureus: POSITIVE — AB

## 2012-03-24 NOTE — Progress Notes (Signed)
Primary Physician - Dr. Waynard Edwards Cardiologist -  Sees Norma Fredrickson NP mostly  Since dr. Deborah Chalk retired EKG nov 2013 in epic

## 2012-03-24 NOTE — Pre-Procedure Instructions (Signed)
22 Kyle [name]  03/24/2012   Your procedure is scheduled on:  Tuesday, December 3rd  Report to [facility] at 0845 AM.  Call this number if you have problems the morning of surgery: [phone]   Remember:   Do not eat food or drink:After Midnight.   Take these medicines the morning of surgery with A SIP OF WATER: atenolol, digxin, uroxatral   Do not wear jewelry, make-up or nail polish.  Do not wear lotions, powders, or perfumes.  Do not shave 48 hours prior to surgery. Men may shave face and neck.  Do not bring valuables to the hospital.  Contacts, dentures or bridgework may not be worn into surgery.  Leave suitcase in the car. After surgery it may be brought to your room.  For patients admitted to the hospital, checkout time is 11:00 AM the day of discharge.   Patients discharged the day of surgery will not be allowed to drive home.    Special Instructions: Shower using CHG 2 nights before surgery and the night before surgery.  If you shower the day of surgery use CHG.  Use special wash - you have one bottle of CHG for all showers.  You should use approximately 1/3 of the bottle for each shower.   Please read over the following fact sheets that you were given: Pain Booklet, Coughing and Deep Breathing, Blood Transfusion Information, MRSA Information and Surgical Site Infection Prevention

## 2012-03-30 MED ORDER — CEFAZOLIN SODIUM-DEXTROSE 2-3 GM-% IV SOLR
2.0000 g | INTRAVENOUS | Status: AC
  Administered 2012-03-31: 2 g via INTRAVENOUS
  Filled 2012-03-30: qty 50

## 2012-03-31 ENCOUNTER — Inpatient Hospital Stay (HOSPITAL_COMMUNITY): Payer: Medicare Other

## 2012-03-31 ENCOUNTER — Inpatient Hospital Stay (HOSPITAL_COMMUNITY)
Admission: RE | Admit: 2012-03-31 | Discharge: 2012-04-03 | DRG: 470 | Disposition: A | Discharge status: Skilled Nursing Facility | Payer: Medicare Other | Source: Ambulatory Visit | Attending: Orthopedic Surgery | Admitting: Orthopedic Surgery

## 2012-03-31 ENCOUNTER — Encounter (HOSPITAL_COMMUNITY): Payer: Self-pay | Admitting: Orthopedic Surgery

## 2012-03-31 ENCOUNTER — Encounter (HOSPITAL_COMMUNITY): Payer: Self-pay | Admitting: Anesthesiology

## 2012-03-31 ENCOUNTER — Encounter (HOSPITAL_COMMUNITY): Payer: Self-pay | Admitting: *Deleted

## 2012-03-31 ENCOUNTER — Inpatient Hospital Stay (HOSPITAL_COMMUNITY): Payer: Medicare Other | Admitting: Anesthesiology

## 2012-03-31 ENCOUNTER — Encounter (HOSPITAL_COMMUNITY)
Admission: RE | Disposition: A | Discharge status: Skilled Nursing Facility | Payer: Self-pay | Source: Ambulatory Visit | Attending: Orthopedic Surgery

## 2012-03-31 DIAGNOSIS — Z7901 Long term (current) use of anticoagulants: Secondary | ICD-10-CM

## 2012-03-31 DIAGNOSIS — M1610 Unilateral primary osteoarthritis, unspecified hip: Principal | ICD-10-CM | POA: Diagnosis present

## 2012-03-31 DIAGNOSIS — Z79899 Other long term (current) drug therapy: Secondary | ICD-10-CM

## 2012-03-31 DIAGNOSIS — I10 Essential (primary) hypertension: Secondary | ICD-10-CM | POA: Diagnosis present

## 2012-03-31 DIAGNOSIS — F17200 Nicotine dependence, unspecified, uncomplicated: Secondary | ICD-10-CM | POA: Diagnosis present

## 2012-03-31 DIAGNOSIS — M1611 Unilateral primary osteoarthritis, right hip: Secondary | ICD-10-CM

## 2012-03-31 DIAGNOSIS — I4891 Unspecified atrial fibrillation: Secondary | ICD-10-CM

## 2012-03-31 DIAGNOSIS — M169 Osteoarthritis of hip, unspecified: Principal | ICD-10-CM | POA: Diagnosis present

## 2012-03-31 DIAGNOSIS — J449 Chronic obstructive pulmonary disease, unspecified: Secondary | ICD-10-CM | POA: Diagnosis present

## 2012-03-31 DIAGNOSIS — Z96649 Presence of unspecified artificial hip joint: Secondary | ICD-10-CM

## 2012-03-31 DIAGNOSIS — J4489 Other specified chronic obstructive pulmonary disease: Secondary | ICD-10-CM | POA: Diagnosis present

## 2012-03-31 DIAGNOSIS — Z01812 Encounter for preprocedural laboratory examination: Secondary | ICD-10-CM

## 2012-03-31 DIAGNOSIS — E785 Hyperlipidemia, unspecified: Secondary | ICD-10-CM | POA: Diagnosis present

## 2012-03-31 HISTORY — PX: TOTAL HIP ARTHROPLASTY: SHX124

## 2012-03-31 HISTORY — DX: Shortness of breath: R06.02

## 2012-03-31 HISTORY — DX: Unilateral primary osteoarthritis, right hip: M16.11

## 2012-03-31 LAB — PROTIME-INR
INR: 1.17 (ref 0.00–1.49)
Prothrombin Time: 14.7 seconds (ref 11.6–15.2)

## 2012-03-31 SURGERY — ARTHROPLASTY, HIP, TOTAL,POSTERIOR APPROACH
Anesthesia: General | Site: Hip | Laterality: Right | Wound class: Clean

## 2012-03-31 MED ORDER — VECURONIUM BROMIDE 10 MG IV SOLR
INTRAVENOUS | Status: DC | PRN
  Administered 2012-03-31: 6 mg via INTRAVENOUS

## 2012-03-31 MED ORDER — FENTANYL CITRATE 0.05 MG/ML IJ SOLN
25.0000 ug | INTRAMUSCULAR | Status: DC | PRN
  Administered 2012-04-01: 25 ug via INTRAVENOUS
  Filled 2012-03-31: qty 2

## 2012-03-31 MED ORDER — ACETAMINOPHEN 325 MG PO TABS
650.0000 mg | ORAL_TABLET | Freq: Four times a day (QID) | ORAL | Status: DC | PRN
  Filled 2012-03-31: qty 2

## 2012-03-31 MED ORDER — KETOROLAC TROMETHAMINE 30 MG/ML IJ SOLN
15.0000 mg | Freq: Once | INTRAMUSCULAR | Status: AC | PRN
  Administered 2012-03-31: 30 mg via INTRAVENOUS

## 2012-03-31 MED ORDER — POTASSIUM CHLORIDE IN NACL 20-0.45 MEQ/L-% IV SOLN
INTRAVENOUS | Status: DC
  Administered 2012-03-31 – 2012-04-03 (×3): via INTRAVENOUS
  Filled 2012-03-31 (×7): qty 1000

## 2012-03-31 MED ORDER — KETOROLAC TROMETHAMINE 30 MG/ML IJ SOLN
INTRAMUSCULAR | Status: AC
  Filled 2012-03-31: qty 1

## 2012-03-31 MED ORDER — KETOROLAC TROMETHAMINE 15 MG/ML IJ SOLN
7.5000 mg | Freq: Four times a day (QID) | INTRAMUSCULAR | Status: AC
  Administered 2012-03-31: 7.5 mg via INTRAVENOUS
  Administered 2012-04-01: 03:00:00 via INTRAVENOUS
  Administered 2012-04-01 (×2): 7.5 mg via INTRAVENOUS
  Filled 2012-03-31 (×4): qty 1

## 2012-03-31 MED ORDER — MENTHOL 3 MG MT LOZG: 1.0000 | LOZENGE | OROMUCOSAL | Status: DC | PRN

## 2012-03-31 MED ORDER — METOCLOPRAMIDE HCL 10 MG PO TABS: 5.0000 mg | ORAL_TABLET | Freq: Three times a day (TID) | ORAL | Status: DC | PRN

## 2012-03-31 MED ORDER — ACETAMINOPHEN 10 MG/ML IV SOLN
1000.0000 mg | Freq: Four times a day (QID) | INTRAVENOUS | Status: DC | PRN
  Administered 2012-03-31: 1000 mg via INTRAVENOUS

## 2012-03-31 MED ORDER — WARFARIN - PHARMACIST DOSING INPATIENT: Freq: Every day | Status: DC

## 2012-03-31 MED ORDER — FENTANYL CITRATE 0.05 MG/ML IJ SOLN
INTRAMUSCULAR | Status: DC | PRN
  Administered 2012-03-31: 100 ug via INTRAVENOUS
  Administered 2012-03-31: 50 ug via INTRAVENOUS

## 2012-03-31 MED ORDER — DIPHENHYDRAMINE HCL 12.5 MG/5ML PO ELIX
12.5000 mg | ORAL_SOLUTION | ORAL | Status: DC | PRN
  Administered 2012-04-01: 25 mg via ORAL
  Filled 2012-03-31: qty 10

## 2012-03-31 MED ORDER — MIDAZOLAM HCL 2 MG/2ML IJ SOLN: 0.5000 mg | Freq: Once | INTRAMUSCULAR | Status: DC | PRN

## 2012-03-31 MED ORDER — CEFAZOLIN SODIUM-DEXTROSE 2-3 GM-% IV SOLR
2.0000 g | Freq: Four times a day (QID) | INTRAVENOUS | Status: AC
  Administered 2012-03-31 (×2): 2 g via INTRAVENOUS
  Filled 2012-03-31 (×2): qty 50

## 2012-03-31 MED ORDER — METOCLOPRAMIDE HCL 5 MG/ML IJ SOLN: 5.0000 mg | Freq: Three times a day (TID) | INTRAMUSCULAR | Status: DC | PRN

## 2012-03-31 MED ORDER — ARTIFICIAL TEARS OP OINT
TOPICAL_OINTMENT | OPHTHALMIC | Status: DC | PRN
  Administered 2012-03-31: 1 via OPHTHALMIC

## 2012-03-31 MED ORDER — ATORVASTATIN CALCIUM 10 MG PO TABS
10.0000 mg | ORAL_TABLET | Freq: Every day | ORAL | Status: DC
  Administered 2012-03-31 – 2012-04-02 (×2): 10 mg via ORAL
  Filled 2012-03-31 (×4): qty 1

## 2012-03-31 MED ORDER — SODIUM CHLORIDE 0.9 % IR SOLN
Status: DC | PRN
  Administered 2012-03-31: 3000 mL

## 2012-03-31 MED ORDER — LIDOCAINE HCL (CARDIAC) 20 MG/ML IV SOLN
INTRAVENOUS | Status: DC | PRN
  Administered 2012-03-31: 30 mg via INTRAVENOUS

## 2012-03-31 MED ORDER — NEOSTIGMINE METHYLSULFATE 1 MG/ML IJ SOLN
INTRAMUSCULAR | Status: DC | PRN
  Administered 2012-03-31: 3 mg via INTRAVENOUS

## 2012-03-31 MED ORDER — ONDANSETRON HCL 4 MG/2ML IJ SOLN: 4.0000 mg | Freq: Four times a day (QID) | INTRAMUSCULAR | Status: DC | PRN

## 2012-03-31 MED ORDER — FENTANYL CITRATE 0.05 MG/ML IJ SOLN
INTRAMUSCULAR | Status: AC
  Filled 2012-03-31: qty 2

## 2012-03-31 MED ORDER — HYDROCODONE-ACETAMINOPHEN 10-325 MG PO TABS
1.0000 | ORAL_TABLET | ORAL | Status: DC | PRN
  Administered 2012-04-01 – 2012-04-03 (×5): 2 via ORAL
  Filled 2012-03-31 (×5): qty 2

## 2012-03-31 MED ORDER — BUPIVACAINE HCL (PF) 0.25 % IJ SOLN
INTRAMUSCULAR | Status: AC
  Filled 2012-03-31: qty 30

## 2012-03-31 MED ORDER — SORBITOL 70 % SOLN: 30.0000 mL | Freq: Every day | Status: DC | PRN

## 2012-03-31 MED ORDER — ATENOLOL 25 MG PO TABS
25.0000 mg | ORAL_TABLET | Freq: Every day | ORAL | Status: DC
  Filled 2012-03-31 (×3): qty 1

## 2012-03-31 MED ORDER — TRIAMTERENE-HCTZ 37.5-25 MG PO TABS
1.0000 | ORAL_TABLET | Freq: Every day | ORAL | Status: DC
  Filled 2012-03-31 (×3): qty 1

## 2012-03-31 MED ORDER — PROPOFOL 10 MG/ML IV BOLUS
INTRAVENOUS | Status: DC | PRN
  Administered 2012-03-31: 150 mg via INTRAVENOUS

## 2012-03-31 MED ORDER — ONDANSETRON HCL 4 MG PO TABS: 4.0000 mg | ORAL_TABLET | Freq: Four times a day (QID) | ORAL | Status: DC | PRN

## 2012-03-31 MED ORDER — ONDANSETRON HCL 4 MG/2ML IJ SOLN
INTRAMUSCULAR | Status: DC | PRN
  Administered 2012-03-31: 4 mg via INTRAVENOUS

## 2012-03-31 MED ORDER — PROMETHAZINE HCL 25 MG/ML IJ SOLN
6.2500 mg | INTRAMUSCULAR | Status: DC | PRN
Start: 2012-03-31 — End: 2012-03-31

## 2012-03-31 MED ORDER — ALUM & MAG HYDROXIDE-SIMETH 200-200-20 MG/5ML PO SUSP: 30.0000 mL | ORAL | Status: DC | PRN

## 2012-03-31 MED ORDER — ACETAMINOPHEN 650 MG RE SUPP: 650.0000 mg | Freq: Four times a day (QID) | RECTAL | Status: DC | PRN

## 2012-03-31 MED ORDER — ACETAMINOPHEN 10 MG/ML IV SOLN
1000.0000 mg | Freq: Four times a day (QID) | INTRAVENOUS | Status: AC
  Administered 2012-03-31 – 2012-04-01 (×4): 1000 mg via INTRAVENOUS
  Filled 2012-03-31 (×4): qty 100

## 2012-03-31 MED ORDER — 0.9 % SODIUM CHLORIDE (POUR BTL) OPTIME
TOPICAL | Status: DC | PRN
  Administered 2012-03-31: 1000 mL

## 2012-03-31 MED ORDER — DIGOXIN 125 MCG PO TABS
125.0000 ug | ORAL_TABLET | Freq: Every day | ORAL | Status: DC
  Administered 2012-04-01 – 2012-04-03 (×3): 125 ug via ORAL
  Filled 2012-03-31 (×3): qty 1

## 2012-03-31 MED ORDER — METHOCARBAMOL 100 MG/ML IJ SOLN
500.0000 mg | Freq: Four times a day (QID) | INTRAVENOUS | Status: DC | PRN
  Filled 2012-03-31: qty 5

## 2012-03-31 MED ORDER — ACETAMINOPHEN 10 MG/ML IV SOLN
INTRAVENOUS | Status: AC
  Filled 2012-03-31: qty 100

## 2012-03-31 MED ORDER — METHOCARBAMOL 500 MG PO TABS
500.0000 mg | ORAL_TABLET | Freq: Four times a day (QID) | ORAL | Status: DC | PRN
  Administered 2012-04-02: 500 mg via ORAL
  Filled 2012-03-31: qty 1

## 2012-03-31 MED ORDER — GLYCOPYRROLATE 0.2 MG/ML IJ SOLN
INTRAMUSCULAR | Status: DC | PRN
  Administered 2012-03-31: .6 mg via INTRAVENOUS

## 2012-03-31 MED ORDER — PHENOL 1.4 % MT LIQD: 1.0000 | OROMUCOSAL | Status: DC | PRN

## 2012-03-31 MED ORDER — ALFUZOSIN HCL ER 10 MG PO TB24
10.0000 mg | ORAL_TABLET | Freq: Every day | ORAL | Status: DC
  Administered 2012-04-01 – 2012-04-03 (×3): 10 mg via ORAL
  Filled 2012-03-31 (×4): qty 1

## 2012-03-31 MED ORDER — FENTANYL CITRATE 0.05 MG/ML IJ SOLN
25.0000 ug | INTRAMUSCULAR | Status: DC | PRN
  Administered 2012-03-31 (×2): 50 ug via INTRAVENOUS
  Administered 2012-03-31 (×3): 25 ug via INTRAVENOUS

## 2012-03-31 MED ORDER — SENNA 8.6 MG PO TABS
1.0000 | ORAL_TABLET | Freq: Two times a day (BID) | ORAL | Status: DC
  Administered 2012-03-31 – 2012-04-03 (×6): 8.6 mg via ORAL
  Filled 2012-03-31 (×7): qty 1

## 2012-03-31 MED ORDER — DOCUSATE SODIUM 100 MG PO CAPS
100.0000 mg | ORAL_CAPSULE | Freq: Two times a day (BID) | ORAL | Status: DC
  Administered 2012-03-31 – 2012-04-03 (×6): 100 mg via ORAL
  Filled 2012-03-31 (×7): qty 1

## 2012-03-31 MED ORDER — WARFARIN SODIUM 10 MG PO TABS
10.0000 mg | ORAL_TABLET | Freq: Once | ORAL | Status: AC
  Administered 2012-03-31: 10 mg via ORAL
  Filled 2012-03-31 (×2): qty 1

## 2012-03-31 MED ORDER — MEPERIDINE HCL 25 MG/ML IJ SOLN
6.2500 mg | INTRAMUSCULAR | Status: DC | PRN
Start: 2012-03-31 — End: 2012-03-31

## 2012-03-31 MED ORDER — EPHEDRINE SULFATE 50 MG/ML IJ SOLN
INTRAMUSCULAR | Status: DC | PRN
  Administered 2012-03-31 (×2): 5 mg via INTRAVENOUS
  Administered 2012-03-31: 10 mg via INTRAVENOUS
  Administered 2012-03-31 (×3): 5 mg via INTRAVENOUS

## 2012-03-31 MED ORDER — LACTATED RINGERS IV SOLN
INTRAVENOUS | Status: DC | PRN
  Administered 2012-03-31: 10:00:00 via INTRAVENOUS

## 2012-03-31 MED ORDER — POLYETHYLENE GLYCOL 3350 17 G PO PACK
17.0000 g | PACK | Freq: Every day | ORAL | Status: DC | PRN
  Administered 2012-04-03: 17 g via ORAL
  Filled 2012-03-31: qty 1

## 2012-03-31 MED ORDER — LACTATED RINGERS IV SOLN
INTRAVENOUS | Status: DC
  Administered 2012-03-31: 10:00:00 via INTRAVENOUS

## 2012-03-31 MED ORDER — ENOXAPARIN SODIUM 40 MG/0.4ML ~~LOC~~ SOLN
40.0000 mg | SUBCUTANEOUS | Status: DC
  Administered 2012-04-03: 40 mg via SUBCUTANEOUS
  Filled 2012-03-31 (×4): qty 0.4

## 2012-03-31 MED ORDER — BUPIVACAINE HCL (PF) 0.25 % IJ SOLN
INTRAMUSCULAR | Status: DC | PRN
  Administered 2012-03-31: 20 mL

## 2012-03-31 SURGICAL SUPPLY — 59 items
BENZOIN TINCTURE PRP APPL 2/3 (GAUZE/BANDAGES/DRESSINGS) ×2 IMPLANT
BLADE SAW SAG 73X25 THK (BLADE) ×1
BLADE SAW SGTL 73X25 THK (BLADE) ×1 IMPLANT
CLOTH BEACON ORANGE TIMEOUT ST (SAFETY) ×2 IMPLANT
CLSR STERI-STRIP ANTIMIC 1/2X4 (GAUZE/BANDAGES/DRESSINGS) ×2 IMPLANT
COVER SURGICAL LIGHT HANDLE (MISCELLANEOUS) ×2 IMPLANT
DRAPE INCISE IOBAN 66X45 STRL (DRAPES) ×2 IMPLANT
DRAPE ORTHO SPLIT 77X108 STRL (DRAPES) ×2
DRAPE PROXIMA HALF (DRAPES) ×2 IMPLANT
DRAPE SURG ORHT 6 SPLT 77X108 (DRAPES) ×2 IMPLANT
DRAPE U-SHAPE 47X51 STRL (DRAPES) ×2 IMPLANT
DRILL BIT 5/64 (BIT) ×2 IMPLANT
DRSG MEPILEX BORDER 4X12 (GAUZE/BANDAGES/DRESSINGS) ×2 IMPLANT
DRSG MEPILEX BORDER 4X8 (GAUZE/BANDAGES/DRESSINGS) IMPLANT
DRSG PAD ABDOMINAL 8X10 ST (GAUZE/BANDAGES/DRESSINGS) ×2 IMPLANT
DURAPREP 26ML APPLICATOR (WOUND CARE) ×2 IMPLANT
ELECT CAUTERY BLADE 6.4 (BLADE) ×2 IMPLANT
ELECT REM PT RETURN 9FT ADLT (ELECTROSURGICAL) ×2
ELECTRODE REM PT RTRN 9FT ADLT (ELECTROSURGICAL) ×1 IMPLANT
EVACUATOR 1/8 PVC DRAIN (DRAIN) IMPLANT
GLOVE BIOGEL PI IND STRL 8 (GLOVE) ×1 IMPLANT
GLOVE BIOGEL PI INDICATOR 8 (GLOVE) ×1
GLOVE ORTHO TXT STRL SZ7.5 (GLOVE) ×4 IMPLANT
GLOVE SURG ORTHO 8.0 STRL STRW (GLOVE) ×4 IMPLANT
GLOVE SURG SS PI 6.5 STRL IVOR (GLOVE) ×2 IMPLANT
GLOVE SURG SS PI 8.5 STRL IVOR (GLOVE) ×1
GLOVE SURG SS PI 8.5 STRL STRW (GLOVE) ×1 IMPLANT
GOWN STRL NON-REIN LRG LVL3 (GOWN DISPOSABLE) ×4 IMPLANT
GOWN STRL REIN 2XL XLG LVL4 (GOWN DISPOSABLE) ×4 IMPLANT
HOOD PEEL AWAY FACE SHEILD DIS (HOOD) ×4 IMPLANT
KIT BASIN OR (CUSTOM PROCEDURE TRAY) ×2 IMPLANT
KIT ROOM TURNOVER OR (KITS) ×2 IMPLANT
MANIFOLD NEPTUNE II (INSTRUMENTS) ×2 IMPLANT
NDL SUT 2 .5 CRC MAYO 1.732X (NEEDLE) ×1 IMPLANT
NEEDLE HYPO 25GX1X1/2 BEV (NEEDLE) ×2 IMPLANT
NEEDLE MAYO TAPER (NEEDLE) ×1
NS IRRIG 1000ML POUR BTL (IV SOLUTION) ×2 IMPLANT
PACK TOTAL JOINT (CUSTOM PROCEDURE TRAY) ×2 IMPLANT
PAD ARMBOARD 7.5X6 YLW CONV (MISCELLANEOUS) ×4 IMPLANT
PILLOW ABDUCTION HIP (SOFTGOODS) ×2 IMPLANT
RETRIEVER SUT HEWSON (MISCELLANEOUS) ×2 IMPLANT
SPONGE GAUZE 4X4 12PLY (GAUZE/BANDAGES/DRESSINGS) ×2 IMPLANT
SPONGE LAP 4X18 X RAY DECT (DISPOSABLE) IMPLANT
STRIP CLOSURE SKIN 1/2X4 (GAUZE/BANDAGES/DRESSINGS) IMPLANT
SUCTION FRAZIER TIP 10 FR DISP (SUCTIONS) ×2 IMPLANT
SUT FIBERWIRE #2 38 REV NDL BL (SUTURE) ×6
SUT FIBERWIRE #2 38 T-5 BLUE (SUTURE)
SUT VIC AB 0 CT1 27 (SUTURE) ×2
SUT VIC AB 0 CT1 27XBRD ANBCTR (SUTURE) ×2 IMPLANT
SUT VIC AB 2-0 CT1 27 (SUTURE) ×1
SUT VIC AB 2-0 CT1 TAPERPNT 27 (SUTURE) ×1 IMPLANT
SUT VIC AB 3-0 SH 18 (SUTURE) ×2 IMPLANT
SUTURE FIBERWR #2 38 T-5 BLUE (SUTURE) IMPLANT
SUTURE FIBERWR#2 38 REV NDL BL (SUTURE) ×3 IMPLANT
SYR CONTROL 10ML LL (SYRINGE) ×2 IMPLANT
TOWEL OR 17X24 6PK STRL BLUE (TOWEL DISPOSABLE) ×2 IMPLANT
TOWEL OR 17X26 10 PK STRL BLUE (TOWEL DISPOSABLE) ×2 IMPLANT
TRAY FOLEY CATH 14FR (SET/KITS/TRAYS/PACK) ×2 IMPLANT
WATER STERILE IRR 1000ML POUR (IV SOLUTION) ×4 IMPLANT

## 2012-03-31 NOTE — Anesthesia Postprocedure Evaluation (Signed)
Anesthesia Post Note  Patient: Kyle [name]  Procedure(s) Performed: Procedure(s) (LRB): TOTAL HIP ARTHROPLASTY (Right)  Anesthesia type: GA  Patient location: PACU  Post pain: Pain level controlled  Post assessment: Post-op Vital signs reviewed  Last Vitals:  Filed Vitals:   03/31/12 1450  BP: 125/89  Pulse: 54  Temp:   Resp: 20    Post vital signs: Reviewed  Level of consciousness: sedated  Complications: No apparent anesthesia complications

## 2012-03-31 NOTE — Progress Notes (Signed)
ANTICOAGULATION CONSULT NOTE - Initial Consult  Pharmacy Consult for Coumadin Indication: VTE prophylaxis  Allergies  Allergen Reactions  . Advair Diskus (Fluticasone-Salmeterol)   . Dilaudid (Hydromorphone Hcl)   . Morphine And Related     Patient Measurements: Weight: 164 lb 14.5 oz (74.8 kg) Heparin Dosing Weight:   Vital Signs: Temp: 97.5 F (36.4 C) (12/03 1530) Temp src: Oral (12/03 0835) BP: 108/54 mmHg (12/03 1515) Pulse Rate: 48  (12/03 1530)  Labs:  Alvira Philips 03/31/12 0831  HGB --  HCT --  PLT --  APTT 31  LABPROT 14.7  INR 1.17  HEPARINUNFRC --  CREATININE --  CKTOTAL --  CKMB --  TROPONINI --    The CrCl is unknown because both a height and weight (above a minimum accepted value) are required for this calculation.   Medical History: Past Medical History  Diagnosis Date  . PAF (paroxysmal atrial fibrillation)     Managed with rate control and coumadin  . COPD (chronic obstructive pulmonary disease)   . HTN (hypertension)   . Hyperlipemia   . OA (osteoarthritis)   . Chronic anticoagulation   . Incomplete rotator cuff tear or rupture of right shoulder, not specified as traumatic 2012  . Chronic anticoagulation   . Nocturia   . Osteoarthritis of right hip 03/31/2012    Medications:  Prescriptions prior to admission  Medication Sig Dispense Refill  . Alfuzosin HCl (UROXATRAL PO) Take 10 mg by mouth 2 (two) times daily.       Marland Kitchen atenolol (TENORMIN) 50 MG tablet Take 0.5 tablets (25 mg total) by mouth daily.      Marland Kitchen atorvastatin (LIPITOR) 10 MG tablet Take 10 mg by mouth daily.        . digoxin (LANOXIN) 0.125 MG tablet Take 1 tablet (125 mcg total) by mouth daily.  90 tablet  3  . triamterene-hydrochlorothiazide (MAXZIDE-25) 37.5-25 MG per tablet Take 1 tablet by mouth daily.       Marland Kitchen warfarin (COUMADIN) 5 MG tablet Take 5-7.5 mg by mouth daily at 6 PM. Take 1 tablet in Sun, tues, Fri. Take 1.5 tablet on Wed, thrus, Sat, Mon        Assessment: DJD  R hip s/p THA Coumadin PTA for afib with last dose 5 days ago prior to surgery. Current INR 1.17. Resuming Coumadin post-op to target INR 1-2. Lovenox 40mg /d to start in am until INR>2   Goal of Therapy:  INR 2-3 Monitor platelets by anticoagulation protocol: Yes   Plan:  Coumadin 10mg  po x 1 tonight.  Merilynn Finland, [employer] 03/31/2012,5:27 PM

## 2012-03-31 NOTE — Anesthesia Preprocedure Evaluation (Addendum)
Anesthesia Evaluation  Patient identified by MRN, date of birth, ID band Patient awake    Reviewed: Allergy & Precautions, H&P , Patient's Chart, lab work & pertinent test results, reviewed documented beta blocker date and time   History of Anesthesia Complications Negative for: history of anesthetic complications  Airway Mallampati: II TM Distance: >3 FB Neck ROM: full    Dental No notable dental hx. (+) Edentulous Lower, Edentulous Upper and Dental Advidsory Given   Pulmonary neg pulmonary ROS, COPD breath sounds clear to auscultation  Pulmonary exam normal       Cardiovascular Exercise Tolerance: Good hypertension, negative cardio ROS  + dysrhythmias Atrial Fibrillation Rhythm:regular Rate:Normal     Neuro/Psych  Neuromuscular disease negative neurological ROS  negative psych ROS   GI/Hepatic negative GI ROS, Neg liver ROS,   Endo/Other  negative endocrine ROS  Renal/GU negative Renal ROS     Musculoskeletal   Abdominal   Peds  Hematology negative hematology ROS (+)   Anesthesia Other Findings PAF (paroxysmal atrial fibrillation)   Managed with rate control and coumadin COPD (chronic obstructive pulmonary disease)        HTN (hypertension)     Hyperlipemia        OA (osteoarthritis)     Chronic anticoagulation        Incomplete rotator cuff tear or rupture of right shoulder, not specified as traumatic 2012   Chronic anticoagulation        Nocturia    Reproductive/Obstetrics negative OB ROS                          Anesthesia Physical Anesthesia Plan  ASA: III  Anesthesia Plan: General ETT   Post-op Pain Management:    Induction:   Airway Management Planned:   Additional Equipment:   Intra-op Plan:   Post-operative Plan:   Informed Consent: I have reviewed the patients History and Physical, chart, labs and discussed the procedure including the risks, benefits and  alternatives for the proposed anesthesia with the patient or authorized representative who has indicated his/her understanding and acceptance.   Dental Advisory Given  Plan Discussed with: CRNA, Surgeon and Anesthesiologist  Anesthesia Plan Comments:        Anesthesia Quick Evaluation

## 2012-03-31 NOTE — H&P (Signed)
PREOPERATIVE H&P  Chief Complaint: DJD RIGHT HIP  HPI: Kyle [name] is a 78 y.o. male who presents for preoperative history and physical with a diagnosis of DJD RIGHT HIP. Symptoms are rated as moderate to severe, and have been worsening.  This is significantly impairing activities of daily living.  He has elected for surgical management. He has failed conservative measures including anti-inflammatories, activity modification, using a cane, among others. He has had a past left total hip arthroplasty, and wishes to have the right one done.  Past Medical History  Diagnosis Date  . PAF (paroxysmal atrial fibrillation)     Managed with rate control and coumadin  . COPD (chronic obstructive pulmonary disease)   . HTN (hypertension)   . Hyperlipemia   . OA (osteoarthritis)   . Chronic anticoagulation   . Incomplete rotator cuff tear or rupture of right shoulder, not specified as traumatic 2012  . Chronic anticoagulation   . Nocturia    Past Surgical History  Procedure Date  . Total hip arthroplasty 1998  . Shoulder arthroscopy 1994    RIGHT  . Cholecystectomy   . Appendectomy   . Cataract extraction w/ intraocular lens  implant, bilateral   . Right knee arthroscopy    History   Social History  . Marital Status: Married    Spouse Name: N/A    Number of Children: N/A  . Years of Education: N/A   Occupational History  . retired    Social History Main Topics  . Smoking status: Current Some Day Smoker -- 44 years    Types: Pipe  . Smokeless tobacco: None     Comment: Smoked cigarettes early age socially  . Alcohol Use: No  . Drug Use: No  . Sexually Active: No   Other Topics Concern  . None   Social History Narrative  . None   Family History  Problem Relation Age of Onset  . Cancer Father   . Cancer Mother   . Other Brother     SUICIDE   Allergies  Allergen Reactions  . Advair Diskus (Fluticasone-Salmeterol)   . Dilaudid (Hydromorphone Hcl)   . Morphine And  Related    Prior to Admission medications   Medication Sig Start Date End Date Taking? Authorizing Provider  Alfuzosin HCl (UROXATRAL PO) Take 10 mg by mouth 2 (two) times daily.    Yes Historical Provider, MD  atenolol (TENORMIN) 50 MG tablet Take 0.5 tablets (25 mg total) by mouth daily. 09/25/10  Yes Rosalio Macadamia, NP  atorvastatin (LIPITOR) 10 MG tablet Take 10 mg by mouth daily.     Yes Historical Provider, MD  digoxin (LANOXIN) 0.125 MG tablet Take 1 tablet (125 mcg total) by mouth daily. 09/27/10 03/29/12 Yes Roger Shelter, MD  triamterene-hydrochlorothiazide (MAXZIDE-25) 37.5-25 MG per tablet Take 1 tablet by mouth daily.    Yes Historical Provider, MD  warfarin (COUMADIN) 5 MG tablet Take 5-7.5 mg by mouth daily at 6 PM. Take 1 tablet in Sun, tues, Fri. Take 1.5 tablet on Wed, thrus, Sat, Mon   Yes Historical Provider, MD     Positive ROS: All other systems have been reviewed and were otherwise negative with the exception of those mentioned in the HPI and as above.  Physical Exam: General: Alert, no acute distress Cardiovascular: No pedal edema Respiratory: No cyanosis, no use of accessory musculature GI: No organomegaly, abdomen is soft and non-tender Skin: No lesions in the area of chief complaint Neurologic: Sensation intact distally Psychiatric:  Patient is competent for consent with normal mood and affect Lymphatic: No axillary or cervical lymphadenopathy  MUSCULOSKELETAL: Right hip has severe loss of motion, 0-90 at most, almost no internal or external rotation, and this activity causes severe pain. He has very limited abduction.  Assessment: DJD RIGHT HIP  Plan: Plan for Procedure(s): TOTAL HIP ARTHROPLASTY  The risks benefits and alternatives were discussed with the patient including but not limited to the risks of nonoperative treatment, versus surgical intervention including infection, bleeding, nerve injury, periprosthetic fracture, the need for revision  surgery, dislocation, leg length discrepancy, blood clots, cardiopulmonary complications, morbidity, mortality, among others, and they were willing to proceed.     Chee Kinslow P, MD Cell [phone] Pager [phone]  03/31/2012 10:19 AM

## 2012-03-31 NOTE — Anesthesia Procedure Notes (Signed)
Procedure Name: Intubation Date/Time: 03/31/2012 11:09 AM Performed by: Carmela Rima Pre-anesthesia Checklist: Patient identified, Emergency Drugs available, Timeout performed, Suction available and Patient being monitored Patient Re-evaluated:Patient Re-evaluated prior to inductionOxygen Delivery Method: Circle system utilized Preoxygenation: Pre-oxygenation with 100% oxygen Intubation Type: IV induction Ventilation: Mask ventilation without difficulty Laryngoscope Size: Mac and 3 Grade View: Grade I Tube type: Oral Tube size: 7.5 mm Number of attempts: 1 Placement Confirmation: ETT inserted through vocal cords under direct vision,  positive ETCO2 and breath sounds checked- equal and bilateral Secured at: 23 cm Tube secured with: Tape Dental Injury: Teeth and Oropharynx as per pre-operative assessment

## 2012-03-31 NOTE — Preoperative (Signed)
Beta Blockers   Reason not to administer Beta Blockers:Not Applicable 

## 2012-03-31 NOTE — Op Note (Signed)
03/31/2012  1:21 PM  PATIENT:  Kyle [name]   MRN: [id number]  PRE-OPERATIVE DIAGNOSIS:  DJD RIGHT HIP  POST-OPERATIVE DIAGNOSIS:  DJD RIGHT HIP  PROCEDURE:  Procedure(s): TOTAL HIP ARTHROPLASTY  PREOPERATIVE INDICATIONS:    Kyle [name] is an 78 y.o. male who has a diagnosis of Osteoarthritis of right hip and elected for surgical management after failing conservative treatment.  The risks benefits and alternatives were discussed with the patient including but not limited to the risks of nonoperative treatment, versus surgical intervention including infection, bleeding, nerve injury, periprosthetic fracture, the need for revision surgery, dislocation, leg length discrepancy, blood clots, cardiopulmonary complications, morbidity, mortality, among others, and they were willing to proceed.     OPERATIVE REPORT     SURGEON:  Teryl Lucy, MD    ASSISTANT:  Janace Litten, OPA-C  (Present throughout the entire procedure,  necessary for completion of procedure in a timely manner, assisting with retraction, instrumentation, and closure)     ANESTHESIA:  General    COMPLICATIONS:  None.     COMPONENTS:  [employer] fit femur size 6 with a 36 mm +8.5 head ball and a pinnacle acetabular shell size 56 with a 10 degree lipped polyethylene liner    PROCEDURE IN DETAIL:   The patient was met in the holding area and  identified.  The appropriate hip was identified and marked at the operative site.  The patient was then transported to the OR  and  placed under general anesthesia.  At that point, the patient was  placed in the lateral decubitus position with the operative side up and  secured to the operating room table and all bony prominences padded.     The operative lower extremity was prepped from the iliac crest to the distal leg including the foot.  Sterile draping was performed.  Time out was performed prior to incision.      A routine posterolateral approach was utilized via sharp  dissection  carried down to the subcutaneous tissue.  Gross bleeders were Bovie coagulated.  The iliotibial band was identified and incised along the length of the skin incision.  Self-retaining retractors were  inserted.  With the hip internally rotated, the short external rotators  were identified. The piriformis and capsule was tagged with FiberWire, and the hip capsule released in a T-type fashion.  The femoral neck was exposed, and I resected the femoral neck using the appropriate jig. This was performed at approximately a thumb's breadth above the lesser trochanter.    I then exposed the deep acetabulum, cleared out any tissue including the ligamentum teres.  A wing retractor was placed.  After adequate visualization, I excised the labrum, and then sequentially reamed.  I placed the trial acetabulum, which seated nicely, and then impacted the real cup into place.  Appropriate version and inclination was confirmed clinically matching their bony anatomy, and also with the use of the jig. He had fairly substantial osteophytes around the acetabular rim, particularly anteriorly and superiorly. These were removed with a rongeur and an osteotome.  A trial polyethylene liner was placed and the wing retractor removed.    I then prepared the proximal femur using the cookie-cutter, the lateralizing reamer, and then sequentially reamed and broached.  A trial broach, neck, and head was utilized, and I reduced the hip and it was found to have excellent stability with functional range of motion. The trial components were then removed, and the real polyethylene liner was placed with the  lip directed posteriorly.  I then impacted the real femoral prosthesis into place into the appropriate version, slightly anteverted to the normal anatomy, and I impacted the real head ball into place. The hip was then reduced and taken through functional range of motion and found to have excellent stability. Leg lengths were  restored.  I then used a 2 mm drill bits to pass the FiberWire suture from the capsule and puriform is through the greater trochanter, and secured this. Excellent posterior capsular repair was achieved. I also closed the T in the capsule.  I then irrigated the hip copiously again with pulse lavage, and repaired the fascia with Vicryl, followed by Vicryl for the subcutaneous tissue, Monocryl for the skin, Steri-Strips and sterile gauze. The wounds were injected. The patient was then awakened and returned to PACU in stable and satisfactory condition. There were no complications.  Teryl Lucy, MD Orthopedic Surgeon [phone]   03/31/2012 1:21 PM

## 2012-03-31 NOTE — Transfer of Care (Signed)
Immediate Anesthesia Transfer of Care Note  Patient: Kyle [name]  Procedure(s) Performed: Procedure(s) (LRB) with comments: TOTAL HIP ARTHROPLASTY (Right)  Patient Location: PACU  Anesthesia Type:General  Level of Consciousness: awake  Airway & Oxygen Therapy: Patient Spontanous Breathing and Patient connected to nasal cannula oxygen  Post-op Assessment: Report given to PACU RN, Post -op Vital signs reviewed and stable and Patient moving all extremities X 4  Post vital signs: Reviewed and stable  Complications: No apparent anesthesia complications

## 2012-03-31 NOTE — Evaluation (Signed)
Physical Therapy Evaluation Patient Details Name: Kyle [name] MRN: [id number] DOB: [birth year] Today's Date: 03/31/2012 Time: 1191-4782 PT Time Calculation (min): 37 min  PT Assessment / Plan / Recommendation Clinical Impression  Pt is a very determined 78 y/o male s/p  R THA.  Pt live with his wife who has dementia and is her primary care giver. Pt plans to d/c to home with assist fo family as needed.   Acute PT to follow pt.       PT Assessment  Patient needs continued PT services    Follow Up Recommendations  Home health PT;Supervision - Intermittent    Does the patient have the potential to tolerate intense rehabilitation      Barriers to Discharge Decreased caregiver support      Equipment Recommendations  Rolling walker with 5" wheels    Recommendations for Other Services     Frequency 7X/week    Precautions / Restrictions Precautions Precautions: Posterior Hip Precaution Comments: educated pt on posterior hip precautions.  Restrictions Weight Bearing Restrictions: Yes   Pertinent Vitals/Pain 2/10 pain in R hip.  Pt medicated prior to session.       Mobility  Bed Mobility Bed Mobility: Supine to Sit;Sitting - Scoot to Edge of Bed Supine to Sit: 4: Min assist Sitting - Scoot to [place] of Bed: 4: Min assist Details for Bed Mobility Assistance: Cues for technique and assist for R LE.   Transfers Transfers: Sit to Stand;Stand to Sit Sit to Stand: 4: Min assist;From bed Stand to Sit: 4: Min assist;To chair/3-in-1 Details for Transfer Assistance: Cues for technique and to maintain hip precautions.  Assist to steady pt.   Ambulation/Gait Ambulation/Gait Assistance: 4: Min guard Ambulation Distance (Feet): 50 Feet Assistive device: Rolling walker Ambulation/Gait Assistance Details: cues for sequencing and wbat on R LE.   Gait Pattern: Step-to pattern Stairs: No Wheelchair Mobility Wheelchair Mobility: No    Shoulder Instructions     Exercises     PT Diagnosis:  Acute pain;Generalized weakness  PT Problem List: Decreased activity tolerance;Decreased range of motion;Decreased strength;Decreased mobility;Decreased knowledge of use of DME;Decreased knowledge of precautions;Pain PT Treatment Interventions: Gait training;Stair training;DME instruction;Functional mobility training;Therapeutic activities;Therapeutic exercise;Patient/family education   PT Goals Acute Rehab PT Goals PT Goal Formulation: With patient Time For Goal Achievement: 04/07/12 Potential to Achieve Goals: Good Pt will go Supine/Side to Sit: with modified independence;with HOB 0 degrees PT Goal: Supine/Side to Sit - Progress: Goal set today Pt will go Sit to Supine/Side: with modified independence;with HOB 0 degrees PT Goal: Sit to Supine/Side - Progress: Goal set today Pt will Transfer Bed to Chair/Chair to Bed: with modified independence PT Transfer Goal: Bed to Chair/Chair to Bed - Progress: Goal set today Pt will Ambulate: 51 - 150 feet;with modified independence;with rolling walker PT Goal: Ambulate - Progress: Goal set today Pt will Go Up / Down Stairs: 3-5 stairs;with supervision;with rail(s) PT Goal: Up/Down Stairs - Progress: Goal set today Pt will Perform Home Exercise Program: with supervision, verbal cues required/provided PT Goal: Perform Home Exercise Program - Progress: Goal set today Additional Goals Additional Goal #1: Pt will verbalize/demonstrate 3/3 posterior hip precautions.  PT Goal: Additional Goal #1 - Progress: Goal set today  Visit Information  Last PT Received On: 03/31/12    Subjective Data  Subjective: Agree to PT eval.   Patient Stated Goal: return to home independent.    Prior Functioning  Home Living Lives With: Spouse Available Help at Discharge: Family;Available PRN/intermittently Type of  Home: House Home Access: Stairs to enter [employer] of Steps: 3 Entrance Stairs-Rails: Right;Left;Can reach both Home Layout: One  level Bathroom Shower/Tub: Walk-in shower;Door [employer] Toilet: Handicapped height Bathroom Accessibility: Yes How Accessible: Accessible via walker Home Adaptive Equipment: Straight cane Prior Function Level of Independence: Independent with assistive device(s) (cane to ambulate) Able to Take Stairs?: Yes Driving: Yes Vocation: Retired Musician: No difficulties Dominant Hand: Right    Cognition  Overall Cognitive Status: Appears within functional limits for tasks assessed/performed Arousal/Alertness: Awake/alert Orientation Level: Appears intact for tasks assessed Behavior During Session: [facility] for tasks performed    Extremity/Trunk Assessment Right Lower Extremity Assessment RLE ROM/Strength/Tone: Unable to fully assess;Due to pain;Due to precautions Left Lower Extremity Assessment LLE ROM/Strength/Tone: Within functional levels Trunk Assessment Trunk Assessment: Normal   Balance    End of Session PT - End of Session Equipment Utilized During Treatment: Gait belt Activity Tolerance: Patient tolerated treatment well Patient left: in chair;with call bell/phone within reach;with nursing in room;with family/visitor present Nurse Communication: Mobility status  GP     Romario Tith 03/31/2012, 6:58 PM  Graylin Sperling L. Tallon Gertz DPT [phone]

## 2012-04-01 ENCOUNTER — Encounter (HOSPITAL_COMMUNITY): Payer: Self-pay | Admitting: General Practice

## 2012-04-01 LAB — CBC
Hemoglobin: 9.5 g/dL — ABNORMAL LOW (ref 13.0–17.0)
MCHC: 33.5 g/dL (ref 30.0–36.0)
RBC: 3.15 MIL/uL — ABNORMAL LOW (ref 4.22–5.81)

## 2012-04-01 LAB — BASIC METABOLIC PANEL
GFR calc non Af Amer: 38 mL/min — ABNORMAL LOW (ref >90)
Glucose, Bld: 97 mg/dL (ref 70–99)
Potassium: 4.4 mEq/L (ref 3.5–5.1)
Sodium: 134 mEq/L — ABNORMAL LOW (ref 135–145)

## 2012-04-01 LAB — PROTIME-INR
INR: 1.38 (ref 0.00–1.49)
Prothrombin Time: 16.6 seconds — ABNORMAL HIGH (ref 11.6–15.2)

## 2012-04-01 MED ORDER — SENNA-DOCUSATE SODIUM 8.6-50 MG PO TABS: 1.0000 | ORAL_TABLET | Freq: Every day | ORAL | Status: DC

## 2012-04-01 MED ORDER — WARFARIN SODIUM 7.5 MG PO TABS
7.5000 mg | ORAL_TABLET | Freq: Once | ORAL | Status: AC
  Administered 2012-04-01: 7.5 mg via ORAL
  Filled 2012-04-01: qty 1

## 2012-04-01 MED ORDER — BISACODYL 5 MG PO TBEC: 5.0000 mg | DELAYED_RELEASE_TABLET | Freq: Every day | ORAL | Status: DC | PRN

## 2012-04-01 MED ORDER — HYDROCODONE-ACETAMINOPHEN 10-325 MG PO TABS: 1.0000 | ORAL_TABLET | Freq: Four times a day (QID) | ORAL | Status: DC | PRN

## 2012-04-01 MED ORDER — WARFARIN SODIUM 5 MG PO TABS: 5.0000 mg | ORAL_TABLET | Freq: Every day | ORAL | Status: DC

## 2012-04-01 NOTE — Progress Notes (Signed)
ANTICOAGULATION CONSULT NOTE - Follow up Consult  Pharmacy Consult for Coumadin Indication: afib  Allergies  Allergen Reactions  . Advair Diskus (Fluticasone-Salmeterol)   . Dilaudid (Hydromorphone Hcl)   . Morphine And Related     Patient Measurements: Weight: 164 lb 14.5 oz (74.8 kg)  Vital Signs: Temp: 98.7 F (37.1 C) (12/04 0541) Temp src: Oral (12/04 0541) BP: 104/60 mmHg (12/04 0541) Pulse Rate: 64  (12/04 0541)  Labs:  Basename 04/01/12 0530 03/31/12 0831  HGB 9.5* --  HCT 28.4* --  PLT 218 --  APTT -- 31  LABPROT 16.6* 14.7  INR 1.38 1.17  HEPARINUNFRC -- --  CREATININE 1.59* --  CKTOTAL -- --  CKMB -- --  TROPONINI -- --    The CrCl is unknown because both a height and weight (above a minimum accepted value) are required for this calculation.   Medical History: Past Medical History  Diagnosis Date  . PAF (paroxysmal atrial fibrillation)     Managed with rate control and coumadin  . COPD (chronic obstructive pulmonary disease)   . HTN (hypertension)   . Hyperlipemia   . OA (osteoarthritis)   . Chronic anticoagulation   . Incomplete rotator cuff tear or rupture of right shoulder, not specified as traumatic 2012  . Chronic anticoagulation   . Nocturia   . Osteoarthritis of right hip 03/31/2012  . Shortness of breath     Medications:  Prescriptions prior to admission  Medication Sig Dispense Refill  . Alfuzosin HCl (UROXATRAL PO) Take 10 mg by mouth 2 (two) times daily.       Marland Kitchen atenolol (TENORMIN) 50 MG tablet Take 0.5 tablets (25 mg total) by mouth daily.      Marland Kitchen atorvastatin (LIPITOR) 10 MG tablet Take 10 mg by mouth daily.        . digoxin (LANOXIN) 0.125 MG tablet Take 1 tablet (125 mcg total) by mouth daily.  90 tablet  3  . triamterene-hydrochlorothiazide (MAXZIDE-25) 37.5-25 MG per tablet Take 1 tablet by mouth daily.       Marland Kitchen warfarin (COUMADIN) 5 MG tablet Take 5-7.5 mg by mouth daily at 6 PM. Take 1 tablet in Sun, tues, Fri. Take 1.5  tablet on Wed, thrus, Sat, Mon        Assessment: DJD R hip s/p THA 78 y/o male patient s/p THA on chronic coumadin for h/o afib. INR subtherapeutic, coumadin resumed yesterday with lovenox. No bleeding reported.   Goal of Therapy:  INR 2-3 Monitor platelets by anticoagulation protocol: Yes   Plan:  Coumadin 7.5mg  today, continue lovenox until INR>2 and f/u daily protime.  Verlene Mayer, PharmD, BCPS Pager [phone] 04/01/2012,11:08 AM

## 2012-04-01 NOTE — Progress Notes (Signed)
Physical Therapy Treatment Patient Details Name: Pauline Trainer MRN: [id number] DOB: 11/09/1926 Today's Date: 04/01/2012 Time: 8295-6213 PT Time Calculation (min): 40 min  PT Assessment / Plan / Recommendation Comments on Treatment Session  Pt much improved from morning session. Pt will have support of his sons and other extended family upon return to home.     Follow Up Recommendations  Home health PT;Supervision - Intermittent     Does the patient have the potential to tolerate intense rehabilitation     Barriers to Discharge        Equipment Recommendations  Rolling walker with 5" wheels    Recommendations for Other Services    Frequency 7X/week   Plan Discharge plan remains appropriate;Frequency remains appropriate    Precautions / Restrictions Precautions Precautions: Posterior Hip Precaution Booklet Issued: Yes (comment) Precaution Comments: Pt unable to recall any posterior hip precautions  Re-educated pt on posterior hip precautions.  Restrictions Weight Bearing Restrictions: Yes RLE Weight Bearing: Weight bearing as tolerated   Pertinent Vitals/Pain 3-5/10 Right hip pain.  Pt medicated prior to PT arrival.      Mobility  Bed Mobility Bed Mobility: Sitting - Scoot to Edge of Bed;Supine to Sit Supine to Sit: 4: Min assist;HOB flat Sitting - Scoot to Edge of Bed: 4: Min assist Sit to Supine: Not Tested (comment) Details for Bed Mobility Assistance: Assist to raise shoulders from bed and scoot hips forward secondary to UE weakness.  Transfers Transfers: Sit to Stand;Stand to Sit Sit to Stand: 4: Min guard;4: Min assist;From bed;From chair/3-in-1;With upper extremity assist (3 trials) Stand to Sit: 4: Min guard;4: Min assist;To chair/3-in-1;With upper extremity assist (3 trials) Details for Transfer Assistance: Cues for technique and to maintain hip precautions.  Assist to steady pt.   Ambulation/Gait Ambulation/Gait Assistance: 4: Min guard Ambulation Distance (Feet):  125 Feet Assistive device: Rolling walker Ambulation/Gait Assistance Details: supervision for safety. Cues for upright trunk and cervical posture Gait Pattern: Step-to pattern;Decreased hip/knee flexion - right;Decreased hip/knee flexion - left;Decreased weight shift to right;Decreased step length - left;Decreased step length - right Gait velocity: WFL Stairs: Yes Stairs Assistance: 4: Min guard Stair Management Technique: Two rails;Forwards Number of Stairs: 2  Wheelchair Mobility Wheelchair Mobility: No    Exercises Total Joint Exercises Heel Slides: 10 reps;AAROM;Seated   PT Diagnosis:    PT Problem List:   PT Treatment Interventions:     PT Goals Acute Rehab PT Goals PT Goal Formulation: With patient Time For Goal Achievement: 04/07/12 Potential to Achieve Goals: Good Pt will go Supine/Side to Sit: with modified independence;with HOB 0 degrees PT Goal: Supine/Side to Sit - Progress: Progressing toward goal Pt will go Sit to Supine/Side: with modified independence;with HOB 0 degrees Pt will Transfer Bed to Chair/Chair to Bed: with modified independence PT Transfer Goal: Bed to Chair/Chair to Bed - Progress: Progressing toward goal Pt will Ambulate: 51 - 150 feet;with modified independence;with rolling walker PT Goal: Ambulate - Progress: Progressing toward goal Pt will Go Up / Down Stairs: 3-5 stairs;with supervision;with rail(s) PT Goal: Up/Down Stairs - Progress: Progressing toward goal Pt will Perform Home Exercise Program: with supervision, verbal cues required/provided PT Goal: Perform Home Exercise Program - Progress: Progressing toward goal  Visit Information  Last PT Received On: 04/01/12    Subjective Data  Subjective: no new c/o Patient Stated Goal: return to home    Cognition  Overall Cognitive Status: Appears within functional limits for tasks assessed/performed Arousal/Alertness: Awake/alert Orientation Level: Appears intact for tasks  assessed Behavior  During Session: [facility] for tasks performed    Balance  Balance Balance Assessed: No  End of Session PT - End of Session Equipment Utilized During Treatment: Gait belt Activity Tolerance: Patient tolerated treatment well Patient left: in chair;with call bell/phone within reach;with family/visitor present Nurse Communication: Mobility status;Patient requests pain meds   GP     Rehman Levinson 04/01/2012, 4:24 PM Nora Sabey L. Keah Lamba DPT [phone]

## 2012-04-01 NOTE — Progress Notes (Signed)
UR COMPLETED  

## 2012-04-01 NOTE — Progress Notes (Signed)
CARE MANAGEMENT NOTE 04/01/2012  Patient:  Kyle [name], Kyle [name]   Account Number:  [id number]  Date Initiated:  04/01/2012  Documentation initiated by:  Vance Peper  Subjective/Objective Assessment:   78 yr old male s/p right total hip arthroplasty.     Action/Plan:   PAtient preoperatively setup with [facility], no changes. Has rolling walker and 3in1.   Anticipated DC Date:  04/03/2012   Anticipated DC Plan:  HOME W HOME HEALTH SERVICES      DC Planning Services  CM consult      [facility] Choice  HOME HEALTH   Choice offered to / List presented to:          [facility] arranged  HH-2 PT      [facility] agency  [facility]   Status of service:  Completed, signed off Medicare Important Message given?   (If response is "NO", the following Medicare IM given date fields will be blank) Date Medicare IM given:   Date Additional Medicare IM given:    Discharge Disposition:  HOME W HOME HEALTH SERVICES  Per UR Regulation:    If discussed at Long Length of Stay Meetings, dates discussed:    Comments:

## 2012-04-01 NOTE — Progress Notes (Signed)
Patient ID: Artur Winningham, male   DOB: 1926/12/06, 78 y.o.   MRN: [id number]     Subjective:  Patient reports pain as mild to moderate.  He states that he is feeling ok and that he would like to get out of bed and sit in a chair.  He is eating breakfast.  Objective:   VITALS:   Filed Vitals:   03/31/12 1545 03/31/12 2010 04/01/12 0106 04/01/12 0541  BP:  112/64 115/67 104/60  Pulse:  64 68 64  Temp:  97.6 F (36.4 C) 98.1 F (36.7 C) 98.7 F (37.1 C)  TempSrc:  Oral Oral Oral  Resp:  16 16 16   Weight: 74.8 kg (164 lb 14.5 oz)     SpO2:  99% 98% 96%    ABD soft Sensation intact distally Dorsiflexion/Plantar flexion intact Incision: dressing C/D/I and no drainage  LABS  Results for orders placed during the hospital encounter of 03/31/12 (from the past 24 hour(s))  APTT     Status: Normal   Collection Time   03/31/12  8:31 AM      Component Value Range   aPTT 31  24 - 37 seconds  PROTIME-INR     Status: Normal   Collection Time   03/31/12  8:31 AM      Component Value Range   Prothrombin Time 14.7  11.6 - 15.2 seconds   INR 1.17  0.00 - 1.49  PROTIME-INR     Status: Abnormal   Collection Time   04/01/12  5:30 AM      Component Value Range   Prothrombin Time 16.6 (*) 11.6 - 15.2 seconds   INR 1.38  0.00 - 1.49  CBC     Status: Abnormal   Collection Time   04/01/12  5:30 AM      Component Value Range   WBC 8.3  4.0 - 10.5 K/uL   RBC 3.15 (*) 4.22 - 5.81 MIL/uL   Hemoglobin 9.5 (*) 13.0 - 17.0 g/dL   HCT 40.9 (*) 81.1 - 91.4 %   MCV 90.2  78.0 - 100.0 fL   MCH 30.2  26.0 - 34.0 pg   MCHC 33.5  30.0 - 36.0 g/dL   RDW 78.2  95.6 - 21.3 %   Platelets 218  150 - 400 K/uL  BASIC METABOLIC PANEL     Status: Abnormal   Collection Time   04/01/12  5:30 AM      Component Value Range   Sodium 134 (*) 135 - 145 mEq/L   Potassium 4.4  3.5 - 5.1 mEq/L   Chloride 100  96 - 112 mEq/L   CO2 27  19 - 32 mEq/L   Glucose, Bld 97  70 - 99 mg/dL   BUN 32 (*) 6 - 23 mg/dL   Creatinine, Ser 0.86 (*) 0.50 - 1.35 mg/dL   Calcium 8.7  8.4 - 57.8 mg/dL   GFR calc non Af Amer 38 (*) >90 mL/min   GFR calc Af Amer 45 (*) >90 mL/min    Dg Pelvis Portable  03/31/2012  *RADIOLOGY REPORT*  Clinical Data: Postop arthroplasty of the hip  PORTABLE PELVIS  Comparison: None.  Findings: Recent total hip replacement on the right with satisfactory position and alignment.  No fracture or complication. Chronic left hip replacement appears satisfactory  IMPRESSION: Satisfactory recent hip replacement on the right.   Original Report Authenticated By: Janeece Riggers, M.D.    Dg Hip Portable 1 View Right  03/31/2012  *RADIOLOGY  REPORT*  Clinical Data: Hip replacement  PORTABLE RIGHT HIP - 1 VIEW  Comparison: Pelvis from 03/31/2012  Findings: Lateral view the right hip reveals satisfactory alignment of right hip prosthesis.  No fracture or immediate complication  IMPRESSION: Satisfactory right hip replacement in the lateral projection.   Original Report Authenticated By: Janeece Riggers, M.D.     Assessment/Plan: 1 Day Post-Op   Principal Problem:  *Osteoarthritis of right hip   Advance diet Up with therapy Continue to monitor ABLA Plan dressing change tomorrow. Coumadin/lovenox bridge. Plan home fri, possible snf if needed based on therapy feedback.   Haskel Khan 04/01/2012, 7:40 AM   Teryl Lucy, MD Cell [phone] Pager [phone]

## 2012-04-01 NOTE — Progress Notes (Signed)
Physical Therapy Treatment Patient Details Name: Kyle [name] MRN: [id number] DOB: [birth year] Today's Date: 04/01/2012 Time: 4098-1191 PT Time Calculation (min): 38 min  PT Assessment / Plan / Recommendation Comments on Treatment Session  Pt mobility limited by pain.  Pt required min assist to stand from elevated recliner and assist to raise his R LE into the bed.  Pt may need to consider short term rehab if mobility does not improve.  At this time pt is at risk for fall and i am unsure that his wife would be able to provide the level of assistance the pt currently needs.  Will continue to monitor pt progress.  No change in D/c plan at this time.     Follow Up Recommendations  Home health PT;Supervision - Intermittent     Does the patient have the potential to tolerate intense rehabilitation     Barriers to Discharge        Equipment Recommendations  Rolling walker with 5" wheels    Recommendations for Other Services    Frequency 7X/week   Plan Discharge plan remains appropriate;Frequency remains appropriate    Precautions / Restrictions Precautions Precautions: Posterior Hip Precaution Booklet Issued: Yes (comment) Precaution Comments: Pt unable to recall any posterior hip precautions  Re-educated pt on posterior hip precautions.  Restrictions Weight Bearing Restrictions: Yes RLE Weight Bearing: Weight bearing as tolerated   Pertinent Vitals/Pain 8/10 pain in R hip.  RN notified of pt pain.  Repositioned pt in supine with no significant relief in pain per pt.     Mobility  Bed Mobility Bed Mobility: Sit to Supine Supine to Sit: Not tested (comment) Sitting - Scoot to Edge of Bed: Not tested (comment) Sit to Supine: 4: Min assist;HOB flat Details for Bed Mobility Assistance: Cues for technique and assist for R LE.   Transfers Transfers: Sit to Stand;Stand to Sit Sit to Stand: 3: Mod assist;From chair/3-in-1;From elevated surface;With upper extremity assist;4: Min assist (two  trials.  ) Stand to Sit: 4: Min assist;To chair/3-in-1;To bed;To elevated surface;With upper extremity assist Details for Transfer Assistance: Cues for technique and to maintain hip precautions.  Assist to steady pt.   Ambulation/Gait Ambulation/Gait Assistance: 4: Min guard Assistive device: Rolling walker Ambulation/Gait Assistance Details: Cueing for gait sequencing, decrease dependence on UEs, decreased distance from RW,  Intermittent assist to steady pt secondary to sudden onset of pain in hip.   Gait Pattern: Step-to pattern;Decreased hip/knee flexion - right;Decreased hip/knee flexion - left;Decreased weight shift to right;Decreased step length - left;Decreased step length - right Gait velocity: WFL General Gait Details: Flexed trunk posture, 3 standing rest breaks secondary to UE fatigue.  Pt determined not to stop until back in room.  Stairs: No Wheelchair Mobility Wheelchair Mobility: No    Exercises Total Joint Exercises Ankle Circles/Pumps: 10 reps Marching in Standing: AAROM;Right;5 reps;Standing   PT Diagnosis:    PT Problem List:   PT Treatment Interventions:     PT Goals Acute Rehab PT Goals PT Goal Formulation: With patient Time For Goal Achievement: 04/07/12 Potential to Achieve Goals: Good Pt will go Supine/Side to Sit: with modified independence;with HOB 0 degrees PT Goal: Supine/Side to Sit - Progress: Progressing toward goal Pt will go Sit to Supine/Side: with modified independence;with HOB 0 degrees PT Goal: Sit to Supine/Side - Progress: Progressing toward goal Pt will Transfer Bed to Chair/Chair to Bed: with modified independence PT Transfer Goal: Bed to Chair/Chair to Bed - Progress: Progressing toward goal Pt will Ambulate: 51 -  150 feet;with modified independence;with rolling walker PT Goal: Ambulate - Progress: Progressing toward goal Pt will Go Up / Down Stairs: 3-5 stairs;with supervision;with rail(s) Pt will Perform Home Exercise Program: with  supervision, verbal cues required/provided PT Goal: Perform Home Exercise Program - Progress: Progressing toward goal Additional Goals Additional Goal #1: Pt will verbalize/demonstrate 3/3 posterior hip precautions.  PT Goal: Additional Goal #1 - Progress: Progressing toward goal  Visit Information  Last PT Received On: 04/01/12    Subjective Data  Subjective: I am hurting worse today. Patient Stated Goal: return to home    Cognition  Overall Cognitive Status: Appears within functional limits for tasks assessed/performed Arousal/Alertness: Awake/alert Orientation Level: Appears intact for tasks assessed Behavior During Session: [facility] for tasks performed    Balance  Balance Balance Assessed: Yes Static Standing Balance Static Standing - Balance Support: Bilateral upper extremity supported Static Standing - Level of Assistance: 5: Stand by assistance Static Standing - Comment/# of Minutes: Stand by assist secondary to loss of R hip/knee control secondary to sudden onset of pain in hip.       End of Session PT - End of Session Equipment Utilized During Treatment: Gait belt Activity Tolerance: Patient limited by fatigue;Patient limited by pain Patient left: in bed;with call bell/phone within reach;with family/visitor present Nurse Communication: Mobility status;Patient requests pain meds   GP     Ilyse Tremain 04/01/2012, 12:04 PM Iretha Kirley L. Javonni Macke DPT [phone]

## 2012-04-02 LAB — CBC
HCT: 27.4 % — ABNORMAL LOW (ref 39.0–52.0)
MCH: 29.8 pg (ref 26.0–34.0)
MCHC: 33.6 g/dL (ref 30.0–36.0)
MCV: 88.7 fL (ref 78.0–100.0)
RDW: 14.4 % (ref 11.5–15.5)

## 2012-04-02 LAB — BASIC METABOLIC PANEL
BUN: 31 mg/dL — ABNORMAL HIGH (ref 6–23)
Calcium: 8.5 mg/dL (ref 8.4–10.5)
Chloride: 99 mEq/L (ref 96–112)
Creatinine, Ser: 1.17 mg/dL (ref 0.50–1.35)
GFR calc Af Amer: 65 mL/min — ABNORMAL LOW (ref >90)
GFR calc non Af Amer: 56 mL/min — ABNORMAL LOW (ref >90)

## 2012-04-02 MED ORDER — WARFARIN SODIUM 7.5 MG PO TABS
7.5000 mg | ORAL_TABLET | Freq: Once | ORAL | Status: AC
  Administered 2012-04-02: 7.5 mg via ORAL
  Filled 2012-04-02: qty 1

## 2012-04-02 NOTE — Progress Notes (Signed)
Saw pt today for OT eval.  See full write up in PN sections.  This pt is having a great amount of difficulty not only recalling hip precautions, but applying them.  Precautions reviewed at length.  Pt  Does not understand the need for them and states he will break them at home b/c this is how he has always done it.  His wife will not be able to assist with this as she has dementia.  Pt did  Have moments in room today that he thought he was at home and was easily confused.  Despite a lot of review and practice, feel this pt would be better served by going to SNF before home.  Pt states since his  Wife has dementia that this is not possible.  May need to get SW involved.  Do not feel this pt is safe to d/c home. Tory Emerald, [place] [phone]

## 2012-04-02 NOTE — Progress Notes (Signed)
ANTICOAGULATION CONSULT NOTE - Follow up Consult  Pharmacy Consult for Coumadin Indication: afib  Allergies  Allergen Reactions  . Advair Diskus (Fluticasone-Salmeterol)   . Dilaudid (Hydromorphone Hcl)   . Morphine And Related     Patient Measurements: Weight: 164 lb 14.5 oz (74.8 kg)  Vital Signs: Temp: 98.8 F (37.1 C) (12/05 0600) BP: 100/51 mmHg (12/05 0600) Pulse Rate: 102  (12/05 0600)  Labs:  Basename 04/02/12 0545 04/01/12 0530 03/31/12 0831  HGB 9.2* 9.5* --  HCT 27.4* 28.4* --  PLT 214 218 --  APTT -- -- 31  LABPROT 17.4* 16.6* 14.7  INR 1.47 1.38 1.17  HEPARINUNFRC -- -- --  CREATININE 1.17 1.59* --  CKTOTAL -- -- --  CKMB -- -- --  TROPONINI -- -- --    The CrCl is unknown because both a height and weight (above a minimum accepted value) are required for this calculation.   Medical History: Past Medical History  Diagnosis Date  . PAF (paroxysmal atrial fibrillation)     Managed with rate control and coumadin  . COPD (chronic obstructive pulmonary disease)   . HTN (hypertension)   . Hyperlipemia   . OA (osteoarthritis)   . Chronic anticoagulation   . Incomplete rotator cuff tear or rupture of right shoulder, not specified as traumatic 2012  . Chronic anticoagulation   . Nocturia   . Osteoarthritis of right hip 03/31/2012  . Shortness of breath     Medications:  Prescriptions prior to admission  Medication Sig Dispense Refill  . Alfuzosin HCl (UROXATRAL PO) Take 10 mg by mouth 2 (two) times daily.       Marland Kitchen atenolol (TENORMIN) 50 MG tablet Take 0.5 tablets (25 mg total) by mouth daily.      Marland Kitchen atorvastatin (LIPITOR) 10 MG tablet Take 10 mg by mouth daily.        . digoxin (LANOXIN) 0.125 MG tablet Take 1 tablet (125 mcg total) by mouth daily.  90 tablet  3  . triamterene-hydrochlorothiazide (MAXZIDE-25) 37.5-25 MG per tablet Take 1 tablet by mouth daily.       Marland Kitchen warfarin (COUMADIN) 5 MG tablet Take 5-7.5 mg by mouth daily at 6 PM. Take 1 tablet  in Sun, tues, Fri. Take 1.5 tablet on Wed, thrus, Sat, Mon        Assessment: DJD R hip s/p THA 78 y/o male patient s/p THA on chronic coumadin for h/o afib. INR subtherapeutic, but trending up. No bleeding reported.   Goal of Therapy:  INR 2-3 Monitor platelets by anticoagulation protocol: Yes   Plan:  Repeat Coumadin 7.5mg  today, continue lovenox until INR>2 and f/u daily protime.  Verlene Mayer, PharmD, BCPS Pager [phone] 04/02/2012,1:27 PM

## 2012-04-02 NOTE — Clinical Social Work Placement (Signed)
CSW faxed out all Clinicals to SNFs. CSW will f/u with bed offers in the am.  Sabino Niemann, MSW, [employer] [phone]

## 2012-04-02 NOTE — Clinical Social Work Psychosocial (Signed)
Clinical Social Worker received referral for the potential need for post acute placement at a SNF. CSW reviewed chart and met with patient at bedside. CSW introduced self, explained role, and provided emotional support. CSW discussed skilled nursing home placement, reviewed the process of placement and answered all the patient's and patient's family's questions. After reviewing the SNF list, Patient reported that he is agreeable to CSW seeking SNF placement. CSW encouraged patient to ask questions as needed of CSW and medical staff. CSW will begin the placement process and will continue to follow and assist with all d/c planning.  Patient and patient's family  were very appreciative of support and information provided by CSW. CSW will continue to follow and will assist with all d/c planning needs Kyle [name], MSW [phone]

## 2012-04-02 NOTE — Progress Notes (Signed)
Physical Therapy Treatment Patient Details Name: Kyle [name] MRN: [id number] DOB: 08-11-1926 Today's Date: 04/02/2012 Time: 9528-4132 PT Time Calculation (min): 48 min  PT Assessment / Plan / Recommendation Comments on Treatment Session  Pt s/p Rt THA with continued difficulties recalling and adhering to hip precautions. Pt demonstrating unsafe use of RW and is at risk for falls. Lengthy discussion with patient re: likely need for short-term SNF for rehab with wife, son, and daughter-in-law present (family very supportive of this plan). Answered numerous questions (with assist of Amy, SW who arrived and joined discussion). Pt ultimately agreeable only to bed search and is still hoping to improve enough to d/c home to care for his wife (with dementia). Son assured him that she would have 24 hour care until pt can come home.    Follow Up Recommendations  SNF;Supervision/Assistance - 24 hour     Does the patient have the potential to tolerate intense rehabilitation     Barriers to Discharge        Equipment Recommendations  3 in 1 bedside comode    Recommendations for Other Services    Frequency 7X/week   Plan Discharge plan needs to be updated;Frequency remains appropriate    Precautions / Restrictions Precautions Precautions: Posterior Hip Precaution Booklet Issued: Yes (comment) Precaution Comments: husband able to recall 1/3 hip precautions (no crossing legs) at beginning of session; reviewed twice during session with unable to recall any at end of session. Restrictions RLE Weight Bearing: Weight bearing as tolerated   Pertinent Vitals/Pain 8/10 Rt hip after ambulation; repositioned    Mobility  Transfers Transfers: Sit to Stand;Stand to Sit Sit to Stand: 3: Mod assist;With upper extremity assist;With armrests;From chair/3-in-1 Stand to Sit: 1: +2 Total assist;With upper extremity assist;With armrests;To chair/3-in-1 Stand to Sit: Patient Percentage: 50% Details for Transfer  Assistance: vc for correct hand placement with use of RW; assist to maintain hip precautions both with sit to stand and stand to sit Ambulation/Gait Ambulation/Gait Assistance: 4: Min assist;Other (comment) (+1 for IV and O2 (pt reports he wears at home at times)) Ambulation Distance (Feet): 100 Feet Assistive device: Rolling walker Ambulation/Gait Assistance Details: constant cues for sequencing and to incr step length on Rt (pt at times only advancing RLE 2 inches); assist to move RW proper distance (tends to push too far ahead); cues for upright posture Gait Pattern: Step-through pattern;Decreased step length - right;Decreased step length - left;Trunk flexed Gait velocity: significantly decr; stopped ~6 times to rest over 100 ft    Exercises Total Joint Exercises Ankle Circles/Pumps: AROM;Both;10 reps;Seated   PT Diagnosis:    PT Problem List:   PT Treatment Interventions:     PT Goals Acute Rehab PT Goals Pt will Transfer Bed to Chair/Chair to Bed: with modified independence PT Transfer Goal: Bed to Chair/Chair to Bed - Progress: Progressing toward goal Pt will Ambulate: 51 - 150 feet;with modified independence;with rolling walker PT Goal: Ambulate - Progress: Progressing toward goal Pt will Perform Home Exercise Program: with supervision, verbal cues required/provided PT Goal: Perform Home Exercise Program - Progress: Progressing toward goal Additional Goals Additional Goal #1: Pt will verbalize/demonstrate 3/3 posterior hip precautions.  PT Goal: Additional Goal #1 - Progress: Not progressing  Visit Information  Last PT Received On: 04/02/12 Assistance Needed: +2    Subjective Data  Subjective: States he is not sure why things are much harder to do today. Son reports he had "a rough night" without much sleep and is clearly "buzzing" from pain  medication. Patient Stated Goal: return to home    Cognition  Overall Cognitive Status: Impaired Area of Impairment:  Safety/judgement;Memory;Awareness of errors Arousal/Alertness: Lethargic (drifting off to sleep during family discussion; easily arous) Behavior During Session: [facility] for tasks performed Memory: Decreased recall of precautions Safety/Judgement: Decreased awareness of safety precautions Awareness of Errors: Assistance required to identify errors made;Assistance required to correct errors made Awareness of Errors - Other Comments: Unable to identify that he was internally rotating RLE during stand to sit and unable to correct when pointed out to him. Problem Solving: Pt unable to problem solve how to apply hip precautions.    Balance     End of Session PT - End of Session Equipment Utilized During Treatment: Gait belt;Oxygen Activity Tolerance: Patient limited by fatigue Patient left: in chair;with call bell/phone within reach;with family/visitor present (pt did not want to get into bed)   GP     Hillard Goodwine 04/02/2012, 4:53 PM Pager [phone]

## 2012-04-02 NOTE — Evaluation (Signed)
Occupational Therapy Evaluation Patient Details Name: Kyle [name] MRN: [id number] DOB: 1926-08-29 Today's Date: 04/02/2012 Time: 4098-1191 OT Time Calculation (min): 35 min  OT Assessment / Plan / Recommendation Clinical Impression  Pt admitted for R THR and has deficits listed below.  Pt would benefit from cont OT to increase I with basic adls and adl mobility.  This therapist has great concerns about this pt returning home with wife that has significant dementia.  Pt unable to recall, understand or apply any hip precautions.  Pt unsafe on his feet at this time.  Will follow.    OT Assessment  Patient needs continued OT Services    Follow Up Recommendations  SNF    Barriers to Discharge Decreased caregiver support wife is home but has dementia.  Equipment Recommendations  3 in 1 bedside comode;Other (comment) (3:1 only if pt does not go to snf.)    Recommendations for Other Services    Frequency  Min 2X/week    Precautions / Restrictions Precautions Precautions: Posterior Hip Precaution Booklet Issued: Yes (comment) Precaution Comments: Pt and spouse unable to verbalized any post hip precautions. Re-educated both on all 3 hip precautions. Restrictions Weight Bearing Restrictions: Yes RLE Weight Bearing: Weight bearing as tolerated   Pertinent Vitals/Pain Pt with 8/10 pain.  Nursing aware.    ADL  Eating/Feeding: Simulated;Independent Where Assessed - Eating/Feeding: Chair Grooming: Performed;Wash/dry face;Wash/dry hands;Set up Where Assessed - Grooming: Supported sitting Upper Body Bathing: Simulated;Set up Where Assessed - Upper Body Bathing: Supported sitting Lower Body Bathing: Simulated;Maximal assistance Where Assessed - Lower Body Bathing: Supported sit to stand Upper Body Dressing: Simulated;Set up Where Assessed - Upper Body Dressing: Supported sitting Lower Body Dressing: Performed;Maximal assistance Where Assessed - Lower Body Dressing: Supported sit to  stand Toilet Transfer: Performed;Maximal Dentist Method: Surveyor, minerals: Materials engineer and Hygiene: Simulated;Maximal assistance Where Assessed - Engineer, mining and Hygiene: Standing Equipment Used: Rolling walker;Sock aid;Reacher;Long-handled shoe horn Transfers/Ambulation Related to ADLs: Pt very unsafe today. Pt needed great amount of assist coming sit to stand and maintaining hip precaustions.  Pt unable to understand the purpose of the hip precautions and therefore was unable to apply them at all with functional activities.  Feel pt is a great risk for dislocating hip as he has no understanding of the precautions.  Pts wife has dementia and also is unable to assist with this. ADL Comments: Pt needs a great amount of assist with all adls.  Pt cannot apply precautions.     OT Diagnosis: Generalized weakness;Cognitive deficits;Acute pain  OT Problem List: Decreased strength;Decreased activity tolerance;Impaired balance (sitting and/or standing);Decreased cognition;Decreased safety awareness;Decreased knowledge of use of DME or AE;Decreased knowledge of precautions;Pain OT Treatment Interventions: Self-care/ADL training;DME and/or AE instruction;Therapeutic activities   OT Goals Acute Rehab OT Goals OT Goal Formulation: With patient/family Time For Goal Achievement: 04/16/12 Potential to Achieve Goals: Fair ADL Goals Pt Will Perform Grooming: with min assist;Sitting at sink ADL Goal: Grooming - Progress: Goal set today Pt Will Perform Lower Body Bathing: with min assist;Sit to stand from chair;with cueing (comment type and amount) (cues for hip precautions.) ADL Goal: Lower Body Bathing - Progress: Goal set today Pt Will Perform Lower Body Dressing: with min assist;Sit to stand from chair;with adaptive equipment;with cueing (comment type and amount) (cues for hip precautions) ADL Goal: Lower Body  Dressing - Progress: Goal set today Pt Will Perform Tub/Shower Transfer: with min assist;Shower seat without back;Maintaining hip precautions  ADL Goal: Tub/Shower Transfer - Progress: Goal set today Additional ADL Goal #1: Pt will complete all toileting with min assist on high commode maintaining all hip precautions with min cues. ADL Goal: Additional Goal #1 - Progress: Goal set today Miscellaneous OT Goals Miscellaneous OT Goal #1: Pt will apply hip precautions to all LE adls and use equipment when necessary with  min cues. OT Goal: Miscellaneous Goal #1 - Progress: Goal set today  Visit Information  Last OT Received On: 04/02/12 Assistance Needed: +1    Subjective Data  Subjective: "I just don't feel as good today." Patient Stated Goal: to go home .   Prior Functioning     Home Living Lives With: Spouse Available Help at Discharge: Family;Available PRN/intermittently Type of Home: House Home Access: Stairs to enter [employer] of Steps: 3 Entrance Stairs-Rails: Right;Left;Can reach both Home Layout: One level Bathroom Shower/Tub: Walk-in shower;Door [employer] Toilet: Handicapped height Bathroom Accessibility: Yes How Accessible: Accessible via walker Home Adaptive Equipment: Straight cane;Shower chair with back Prior Function Level of Independence: Independent with assistive device(s) Able to Take Stairs?: Yes Driving: Yes Vocation: Retired Musician: No difficulties Dominant Hand: Right         Vision/Perception     Cognition  Overall Cognitive Status: Impaired Area of Impairment: Problem solving;Executive functioning;Awareness of errors Arousal/Alertness: Awake/alert Orientation Level: Disoriented to;Place;Other (comment) (pt thought he was at home.) Behavior During Session: [facility] for tasks performed Awareness of Errors: Assistance required to identify errors made;Assistance required to correct errors made;Other (comment) (all in  regards to hip precautions.) Problem Solving: Pt unable to problem solve how to apply hip precautions. Cognition - Other Comments: Pt supposedly has not had problems in past with cognition.  Pt unable to recall hip precautions despite them being reviewed many times.  Pt also not able to recall things we talked aBout earlier in session.    Extremity/Trunk Assessment Right Upper Extremity Assessment RUE ROM/Strength/Tone: Due to pain;Unable to fully assess (pt fell in R shoulder in past.) RUE Sensation: WFL - Light Touch RUE Coordination: WFL - gross/fine motor Left Upper Extremity Assessment LUE ROM/Strength/Tone: Within functional levels LUE Sensation: WFL - Light Touch LUE Coordination: WFL - gross/fine motor Trunk Assessment Trunk Assessment: Normal     Mobility Bed Mobility Supine to Sit: 4: Min assist;HOB flat Sitting - Scoot to Edge of Bed: 4: Min assist Details for Bed Mobility Assistance: pt required max cues for technique to get to edge of bed and adhere to hip precautions. educated pt on use of sheet to advance right leg off edge of bed. required assistance with right leg management along with sheet and to elevated trunk into seated positon. Spouse present and edcuated on safe ways to assist pt with sitting up (wife grabbing pt's arms and attempting to pull on him)              Transfers Transfers: Sit to Stand;Stand to Sit Sit to Stand: 2: Max assist;With upper extremity assist;From chair/3-in-1 Stand to Sit: 3: Mod assist;To chair/3-in-1;With upper extremity assist;With armrests Details for Transfer Assistance: Pt needed cues for every bit of transfer.  He was wanting to  pull up on walker, break hip precautions, did not reach back when sitting.  Very unsafe.     Shoulder Instructions     Exercise Total Joint Exercises Hip ABduction/ADduction: AROM;Strengthening;Right;10 reps;Standing Marching in Standing: AROM;Strengthening;Right;10 reps;Standing   Balance  Balance Balance Assessed: No Static Standing Balance Static Standing - Balance Support: Bilateral upper extremity  supported Static Standing - Level of Assistance: 3: Mod assist Static Standing - Comment/# of Minutes: 2   End of Session OT - End of Session Activity Tolerance: Patient limited by pain Patient left: in chair;with call bell/phone within reach;with family/visitor present Nurse Communication: Mobility status;Other (comment) (d/c concerns)  GO     Hope Budds 04/02/2012, 12:02 PM [phone]

## 2012-04-02 NOTE — Progress Notes (Signed)
Physical Therapy Treatment Patient Details Name: Kyle [name] MRN: [id number] DOB: [birth year] Today's Date: 04/02/2012 Time: 4098-1191 PT Time Calculation (min): 38 min  PT Assessment / Plan / Recommendation Comments on Treatment Session  Pt with decreased activity tolerance and functional mobility today. Not safe to discharge home with spouse assist at current mobilty level. May need to update discharge status if no progress with pm session.    Follow Up Recommendations  Home health PT;Supervision - Intermittent           Equipment Recommendations  Rolling walker with 5" wheels       Frequency 7X/week   Plan Discharge plan remains appropriate;Frequency remains appropriate    Precautions / Restrictions Precautions Precautions: Posterior Hip Precaution Comments: Pt and spouse unable to verbalized any post hip precautions. Re-educated both on all 3 hip precautions. Restrictions Weight Bearing Restrictions: Yes RLE Weight Bearing: Weight bearing as tolerated       Mobility  Bed Mobility Supine to Sit: 4: Min assist;HOB flat Sitting - Scoot to Edge of Bed: 4: Min assist Details for Bed Mobility Assistance: pt required max cues for technique to get to edge of bed and adhere to hip precautions. educated pt on use of sheet to advance right leg off edge of bed. required assistance with right leg management along with sheet and to elevated trunk into seated positon. Spouse present and edcuated on safe ways to assist pt with sitting up (wife grabbing pt's arms and attempting to pull on him)              Transfers Sit to Stand: 4: Min assist;From elevated surface;From bed;With upper extremity assist Stand to Sit: 3: Mod assist;To chair/3-in-1;With upper extremity assist;With armrests Details for Transfer Assistance: cues for hand placement and right leg placement with both transfers. assist needed to sit safely and controled into recliner and to ensure appropriate right leg placement while  sitting down. Ambulation/Gait Ambulation/Gait Assistance: 4: Min assist Ambulation Distance (Feet): 6 Feet Assistive device: Rolling walker Ambulation/Gait Assistance Details: mod cues for posture, bil leg advancement and to use arms to offwt right leg with stance due to pain. after 6 feet pt stated he "couldn't do any more and needed to sit down before he falls". pt needed min assist through out for balance. Gait Pattern: Step-to pattern;Decreased stance time - right;Decreased step length - right;Decreased step length - left;Trunk flexed;Shuffle    Exercises Total Joint Exercises Hip ABduction/ADduction: AROM;Strengthening;Right;10 reps;Standing Marching in Standing: AROM;Strengthening;Right;10 reps;Standing     PT Goals Acute Rehab PT Goals PT Goal: Supine/Side to Sit - Progress: Progressing toward goal PT Goal: Ambulate - Progress: Not progressing PT Goal: Perform Home Exercise Program - Progress: Progressing toward goal Additional Goals PT Goal: Additional Goal #1 - Progress: Not progressing  Visit Information  Last PT Received On: 04/02/12 Assistance Needed: +1    Subjective Data  Subjective: Reports having increased right hip pain, 6-7/10 currently. Had pain meds this am.   Cognition  Overall Cognitive Status: Appears within functional limits for tasks assessed/performed Arousal/Alertness: Awake/alert Orientation Level: Appears intact for tasks assessed Behavior During Session: [facility] for tasks performed       End of Session PT - End of Session Equipment Utilized During Treatment: Gait belt Activity Tolerance: Patient limited by pain Patient left: in chair;with call bell/phone within reach;with family/visitor present Nurse Communication: Mobility status;Patient requests pain meds;Other (comment) (decr in functional mobiltiy today)   GP     Sallyanne Kuster 04/02/2012, 9:49 AM  Willow Ora, PTA Office- [phone]

## 2012-04-02 NOTE — Progress Notes (Signed)
Patient ID: Kyle [name], male   DOB: 31-Jan-1927, 78 y.o.   MRN: [id number]     Subjective:  Patient reports pain as mild to moderate.  He states that he would like to go home but is not ready today.  Objective:   VITALS:   Filed Vitals:   04/01/12 0541 04/01/12 1432 04/01/12 2102 04/02/12 0600  BP: 104/60 99/48 140/57 100/51  Pulse: 64 60 90 102  Temp: 98.7 F (37.1 C) 99.4 F (37.4 C) 99.7 F (37.6 C) 98.8 F (37.1 C)  TempSrc: Oral     Resp: 16 17 20 18   Weight:      SpO2: 96% 96% 92% 92%    ABD soft Sensation intact distally Dorsiflexion/Plantar flexion intact Incision: dressing C/D/I and no drainage  LABS  Results for orders placed during the hospital encounter of 03/31/12 (from the past 24 hour(s))  PROTIME-INR     Status: Abnormal   Collection Time   04/02/12  5:45 AM      Component Value Range   Prothrombin Time 17.4 (*) 11.6 - 15.2 seconds   INR 1.47  0.00 - 1.49  CBC     Status: Abnormal   Collection Time   04/02/12  5:45 AM      Component Value Range   WBC 12.0 (*) 4.0 - 10.5 K/uL   RBC 3.09 (*) 4.22 - 5.81 MIL/uL   Hemoglobin 9.2 (*) 13.0 - 17.0 g/dL   HCT 78.2 (*) 95.6 - 21.3 %   MCV 88.7  78.0 - 100.0 fL   MCH 29.8  26.0 - 34.0 pg   MCHC 33.6  30.0 - 36.0 g/dL   RDW 08.6  57.8 - 46.9 %   Platelets 214  150 - 400 K/uL  BASIC METABOLIC PANEL     Status: Abnormal   Collection Time   04/02/12  5:45 AM      Component Value Range   Sodium 133 (*) 135 - 145 mEq/L   Potassium 4.0  3.5 - 5.1 mEq/L   Chloride 99  96 - 112 mEq/L   CO2 24  19 - 32 mEq/L   Glucose, Bld 136 (*) 70 - 99 mg/dL   BUN 31 (*) 6 - 23 mg/dL   Creatinine, Ser 6.29  0.50 - 1.35 mg/dL   Calcium 8.5  8.4 - 52.8 mg/dL   GFR calc non Af Amer 56 (*) >90 mL/min   GFR calc Af Amer 65 (*) >90 mL/min    Dg Pelvis Portable  03/31/2012  *RADIOLOGY REPORT*  Clinical Data: Postop arthroplasty of the hip  PORTABLE PELVIS  Comparison: None.  Findings: Recent total hip replacement on the right  with satisfactory position and alignment.  No fracture or complication. Chronic left hip replacement appears satisfactory  IMPRESSION: Satisfactory recent hip replacement on the right.   Original Report Authenticated By: Janeece Riggers, M.D.    Dg Hip Portable 1 View Right  03/31/2012  *RADIOLOGY REPORT*  Clinical Data: Hip replacement  PORTABLE RIGHT HIP - 1 VIEW  Comparison: Pelvis from 03/31/2012  Findings: Lateral view the right hip reveals satisfactory alignment of right hip prosthesis.  No fracture or immediate complication  IMPRESSION: Satisfactory right hip replacement in the lateral projection.   Original Report Authenticated By: Janeece Riggers, M.D.     Assessment/Plan: 2 Days Post-Op   Principal Problem:  *Osteoarthritis of right hip   Advance diet Up with therapy Plan for discharge tomorrow or Sat. If passes PT/OT   DOUGLAS  Janace Litten 04/02/2012, 9:08 AM   Teryl Lucy, MD Cell [phone] Pager [phone]

## 2012-04-02 NOTE — Progress Notes (Signed)
Patient refusing to take Lovenox. States "He will only take coumadin because that is his normal blood thinner."

## 2012-04-03 LAB — CBC
MCH: 30.1 pg (ref 26.0–34.0)
MCV: 88.4 fL (ref 78.0–100.0)
Platelets: 214 10*3/uL (ref 150–400)
RBC: 3.02 MIL/uL — ABNORMAL LOW (ref 4.22–5.81)

## 2012-04-03 MED ORDER — DIGOXIN 125 MCG PO TABS: 125.0000 ug | ORAL_TABLET | Freq: Every day | ORAL | Status: DC

## 2012-04-03 MED ORDER — WARFARIN SODIUM 5 MG PO TABS
5.0000 mg | ORAL_TABLET | Freq: Once | ORAL | Status: DC
  Filled 2012-04-03: qty 1

## 2012-04-03 NOTE — Progress Notes (Signed)
Occupational Therapy Treatment Patient Details Name: Kyle [name] MRN: [id number] DOB: 09/08/1926 Today's Date: 04/03/2012 Time: 2536-6440 OT Time Calculation (min): 41 min  OT Assessment / Plan / Recommendation Comments on Treatment Session Pt needing total assist +2 for sit to stand and simulated toileting tasks and functional transfers.  Feel he will need SNF for further rehab and that it is not safe for pt to go home based on his wife not being able to assist him and the need for 24 hour physical assist.      Follow Up Recommendations  SNF       Equipment Recommendations  3 in 1 bedside comode       Frequency Min 2X/week   Plan Discharge plan remains appropriate    Precautions / Restrictions Precautions Precautions: Posterior Hip;Fall Precaution Comments: Patient only able to recall no crossing however requires cues to maintain precautions with mobility Restrictions Weight Bearing Restrictions: No RLE Weight Bearing: Weight bearing as tolerated   Pertinent Vitals/Pain Pain 3/4 on the faces scale in the right hip, pt repositioned in chair    ADL  Lower Body Dressing: Simulated;+2 Total assistance;Other (comment) (with use of AE for LB selfcare) Lower Body Dressing: Patient Percentage: 50% Where Assessed - Lower Body Dressing: Supported sit to stand Toilet Transfer: Chief of Staff: Patient Percentage: 50% Statistician Method: Stand pivot Toileting - Architect and Hygiene: Simulated;+2 Total assistance Toileting - Architect and Hygiene: Patient Percentage: 50% Where Assessed - Toileting Clothing Manipulation and Hygiene: Sit to stand from 3-in-1 or toilet Transfers/Ambulation Related to ADLs: Pt unable to take more than 1-2 steps this session.  Increased LOB posteriorly in standing.  Needed total +2 (pt 50%) to stand from bedside chair. ADL Comments: Pt needing max instructional cueing for utilization of AE for LB  selfcare tasks.  Only able to state 1/3 THR precautions.  Pt's son and daughter-in-law present for session.  They were able to see the amount of assist that pt currently requires and are in agreement that pt needs SNF level rehab.  Pt has finally agreed to go to SNF for further rehab.    OT Goals ADL Goals ADL Goal: Lower Body Dressing - Progress: Progressing toward goals ADL Goal: Additional Goal #1 - Progress: Progressing toward goals Miscellaneous OT Goals OT Goal: Miscellaneous Goal #1 - Progress: Progressing toward goals  Visit Information  Last OT Received On: 04/03/12 Assistance Needed: +1    Subjective Data  Subjective: I don't want to go to someone elses home to stay. Patient Stated Goal: Pt wants to go home      Cognition  Overall Cognitive Status: Impaired Area of Impairment: Memory;Safety/judgement;Problem solving Arousal/Alertness: Awake/alert Orientation Level: Time;Disoriented to Behavior During Session: [facility] for tasks performed Memory: Decreased recall of precautions Safety/Judgement: Decreased safety judgement for tasks assessed;Decreased awareness of safety precautions Awareness of Errors: Assistance required to identify errors made;Assistance required to correct errors made Cognition - Other Comments: Pt stating he fell last night in the bathroom across the hall.      Mobility  Shoulder Instructions Bed Mobility Bed Mobility: Not assessed Supine to Sit: 3: Mod assist;With rails Sitting - Scoot to Edge of Bed: 3: Mod assist;With rail Details for Bed Mobility Assistance: Patient requiring Mod A this morning to assist with shoulders and trucal control up into sitting postion. Cues throughout for sequency and precautions Transfers Transfers: Sit to Stand Sit to Stand: 1: +2 Total assist;With armrests;With upper extremity assist;From chair/3-in-1 Sit to  Stand: Patient Percentage: 50% Stand to Sit: 1: +2 Total assist;With armrests;To chair/3-in-1 Stand to Sit:  Patient Percentage: 50% Details for Transfer Assistance: A for all aspects of stand. Cues for positoning          Balance Balance Balance Assessed: Yes Static Standing Balance Static Standing - Balance Support: Right upper extremity supported;Left upper extremity supported Static Standing - Level of Assistance: 2: Max assist Static Standing - Comment/# of Minutes: Pt with frequent LOB posteriorly in standing.   End of Session OT - End of Session Equipment Utilized During Treatment: Gait belt Activity Tolerance: Patient limited by fatigue Patient left: in chair;with call bell/phone within reach;with family/visitor present     [facility] OTR/L Pager number [phone] 04/03/2012, 2:54 PM

## 2012-04-03 NOTE — Plan of Care (Signed)
Problem: Discharge Progression Outcomes Goal: Anticoagulant follow-up in place Outcome: Completed/Met Date Met:  04/03/12 With ortho MD Goal: Activity appropriate for discharge plan Outcome: Completed/Met Date Met:  04/03/12 TO SNF FOR FURTHER THERAPY

## 2012-04-03 NOTE — Progress Notes (Signed)
Physical Therapy Treatment Patient Details Name: Kyle [name] MRN: [id number] DOB: 1926-05-05 Today's Date: 04/03/2012 Time: 4098-1191 PT Time Calculation (min): 39 min  PT Assessment / Plan / Recommendation Comments on Treatment Session  Patient s/p THA and continues to be unsafe with mobility and unable to recall preacuations. Patient stating that he is refusing to go to rehab however I am unsure how competent patient is to make this decision. The PA stated that he would be discharging to SNF as he is unsire if patient has assistance at home    Follow Up Recommendations  SNF;Supervision/Assistance - 24 hour     Does the patient have the potential to tolerate intense rehabilitation     Barriers to Discharge        Equipment Recommendations  3 in 1 bedside comode    Recommendations for Other Services    Frequency 7X/week   Plan Discharge plan remains appropriate;Frequency remains appropriate    Precautions / Restrictions Precautions Precautions: Posterior Hip Precaution Comments: Patient unable to recall precautions and needing cueing to maintain with mobility Restrictions RLE Weight Bearing: Weight bearing as tolerated   Pertinent Vitals/Pain     Mobility  Bed Mobility Supine to Sit: 3: Mod assist;With rails Sitting - Scoot to Edge of Bed: 3: Mod assist;With rail Details for Bed Mobility Assistance: Patient requiring Mod A this morning to assist with shoulders and trucal control up into sitting postion. Cues throughout for sequency and precautions Transfers Sit to Stand: 1: +2 Total assist;From chair/3-in-1;From bed Sit to Stand: Patient Percentage: 50% Stand to Sit: 4: Min assist;With upper extremity assist;To chair/3-in-1 Details for Transfer Assistance: A to initiate stand and to ensure balance. Patient needed cues for positioning and hand placement prior to sitting and standing.  Ambulation/Gait Ambulation/Gait Assistance: 4: Min assist Ambulation Distance (Feet): 120  Feet (+1 for chair and line) Assistive device: Rolling walker Ambulation/Gait Assistance Details: Cues for sequency and management of RW. A for stability and balance.  Gait Pattern: Step-through pattern;Decreased stride length;Narrow base of support;Trunk flexed;Decreased step length - right;Decreased step length - left Gait velocity: decreased    Exercises     PT Diagnosis:    PT Problem List:   PT Treatment Interventions:     PT Goals Acute Rehab PT Goals PT Goal: Supine/Side to Sit - Progress: Progressing toward goal PT Transfer Goal: Bed to Chair/Chair to Bed - Progress: Progressing toward goal PT Goal: Ambulate - Progress: Progressing toward goal Additional Goals PT Goal: Additional Goal #1 - Progress: Not progressing  Visit Information  Last PT Received On: 04/03/12 Assistance Needed: +2    Subjective Data      Cognition  Overall Cognitive Status: Impaired Area of Impairment: Safety/judgement;Awareness of errors;Memory Arousal/Alertness: Awake/alert Orientation Level: Disoriented to;Time;Place Behavior During Session: [facility] for tasks performed Memory: Decreased recall of precautions Safety/Judgement: Decreased awareness of safety precautions Awareness of Errors: Assistance required to identify errors made;Assistance required to correct errors made Cognition - Other Comments: Patient stating, "where did saturday and sunday go to"  Patient thinks that he had already stated here over the weekend. Patient also asking while PT was seeing him at night when it was just 10am    Balance     End of Session PT - End of Session Equipment Utilized During Treatment: Gait belt Activity Tolerance: Patient tolerated treatment well Patient left: in chair;with call bell/phone within reach Nurse Communication: Mobility status   GP     Fredrich Birks 04/03/2012, 11:28 AM 04/03/2012 [place],  Tonia Brooms PTA [phone] pager [phone] office

## 2012-04-03 NOTE — Progress Notes (Signed)
ANTICOAGULATION CONSULT NOTE - Follow up Consult  Pharmacy Consult for Coumadin Indication: afib  Allergies  Allergen Reactions  . Advair Diskus (Fluticasone-Salmeterol)   . Dilaudid (Hydromorphone Hcl)   . Morphine And Related     Patient Measurements: Weight: 164 lb 14.5 oz (74.8 kg)  Vital Signs: Temp: 99 F (37.2 C) (12/06 0433) Temp src: Oral (12/06 0433) BP: 114/59 mmHg (12/06 0433) Pulse Rate: 85  (12/06 0433)  Labs:  Basename 04/03/12 0630 04/02/12 0545 04/01/12 0530  HGB 9.1* 9.2* --  HCT 26.7* 27.4* 28.4*  PLT 214 214 218  APTT -- -- --  LABPROT 21.4* 17.4* 16.6*  INR 1.94* 1.47 1.38  HEPARINUNFRC -- -- --  CREATININE -- 1.17 1.59*  CKTOTAL -- -- --  CKMB -- -- --  TROPONINI -- -- --    The CrCl is unknown because both a height and weight (above a minimum accepted value) are required for this calculation.   Medical History: Past Medical History  Diagnosis Date  . PAF (paroxysmal atrial fibrillation)     Managed with rate control and coumadin  . COPD (chronic obstructive pulmonary disease)   . HTN (hypertension)   . Hyperlipemia   . OA (osteoarthritis)   . Chronic anticoagulation   . Incomplete rotator cuff tear or rupture of right shoulder, not specified as traumatic 2012  . Chronic anticoagulation   . Nocturia   . Osteoarthritis of right hip 03/31/2012  . Shortness of breath     Medications:  Prescriptions prior to admission  Medication Sig Dispense Refill  . Alfuzosin HCl (UROXATRAL PO) Take 10 mg by mouth 2 (two) times daily.       Marland Kitchen atenolol (TENORMIN) 50 MG tablet Take 0.5 tablets (25 mg total) by mouth daily.      Marland Kitchen atorvastatin (LIPITOR) 10 MG tablet Take 10 mg by mouth daily.        . digoxin (LANOXIN) 0.125 MG tablet Take 1 tablet (125 mcg total) by mouth daily.  90 tablet  3  . triamterene-hydrochlorothiazide (MAXZIDE-25) 37.5-25 MG per tablet Take 1 tablet by mouth daily.       Marland Kitchen warfarin (COUMADIN) 5 MG tablet Take 5-7.5 mg by  mouth daily at 6 PM. Take 1 tablet in Sun, tues, Fri. Take 1.5 tablet on Wed, thrus, Sat, Mon        Assessment: DJD R hip s/p THA 78 y/o male patient s/p THA on chronic coumadin for h/o afib. INR subtherapeutic, but trending up. No bleeding reported.  Patients home dose is 5mg  daily except 7.5mg  on Sun,Tues and Fridays.     Goal of Therapy:  INR 2-3 Monitor platelets by anticoagulation protocol: Yes   Plan:  Coumadin 5mg  po x 1 dose tonight. Daily PT/INR.  Wendie Simmer, PharmD, BCPS Clinical Pharmacist  Pager: [phone]

## 2012-04-03 NOTE — Discharge Summary (Signed)
Physician Discharge Summary  Patient ID: Kyle [name] MRN: [id number] DOB/AGE: [birth year] 78 y.o.  Admit date: 03/31/2012 Discharge date: 04/03/2012  Admission Diagnoses:  Osteoarthritis of right hip  Discharge Diagnoses:  Principal Problem:  *Osteoarthritis of right hip   Past Medical History  Diagnosis Date  . PAF (paroxysmal atrial fibrillation)     Managed with rate control and coumadin  . COPD (chronic obstructive pulmonary disease)   . HTN (hypertension)   . Hyperlipemia   . OA (osteoarthritis)   . Chronic anticoagulation   . Incomplete rotator cuff tear or rupture of right shoulder, not specified as traumatic 2012  . Chronic anticoagulation   . Nocturia   . Osteoarthritis of right hip 03/31/2012  . Shortness of breath     Surgeries: Procedure(s): TOTAL HIP ARTHROPLASTY on 03/31/2012   Consultants (if any):    Discharged Condition: Improved  Hospital Course: Markez Antos is an 78 y.o. male who was admitted 03/31/2012 with a diagnosis of Osteoarthritis of right hip and went to the operating room on 03/31/2012 and underwent the above named procedures.    He was given perioperative antibiotics:      Anti-infectives     Start     Dose/Rate Route Frequency Ordered Stop   03/31/12 1700   ceFAZolin (ANCEF) IVPB 2 g/50 mL premix        2 g 100 mL/hr over 30 Minutes Intravenous Every 6 hours 03/31/12 1555 03/31/12 2347   03/30/12 1444   ceFAZolin (ANCEF) IVPB 2 g/50 mL premix        2 g 100 mL/hr over 30 Minutes Intravenous 60 min pre-op 03/30/12 1444 03/31/12 1102        .  He was given sequential compression devices, early ambulation, and lovenox bridging to coumadin for DVT prophylaxis.  His long term digoxin also expired, but was renewed.  He did have a fall on POD 3, but was not having any significant change in symptoms per report.  He was ambulating with PT, but making slow progress and so will be transferred to SNF for ongoing rehab.  He benefited maximally from  the hospital stay and there were no complications.  He did have mild ABLA expected but did not require transfusion.  Recent vital signs:  Filed Vitals:   04/03/12 0433  BP: 114/59  Pulse: 85  Temp: 99 F (37.2 C)  Resp: 18    Recent laboratory studies:  Lab Results  Component Value Date   HGB 9.1* 04/03/2012   HGB 9.2* 04/02/2012   HGB 9.5* 04/01/2012   Lab Results  Component Value Date   WBC 11.8* 04/03/2012   PLT 214 04/03/2012   Lab Results  Component Value Date   INR 1.94* 04/03/2012   Lab Results  Component Value Date   NA 133* 04/02/2012   K 4.0 04/02/2012   CL 99 04/02/2012   CO2 24 04/02/2012   BUN 31* 04/02/2012   CREATININE 1.17 04/02/2012   GLUCOSE 136* 04/02/2012    Discharge Medications:     Medication List     As of 04/03/2012 10:12 AM    TAKE these medications         atenolol 50 MG tablet   Commonly known as: TENORMIN   Take 0.5 tablets (25 mg total) by mouth daily.      atorvastatin 10 MG tablet   Commonly known as: LIPITOR   Take 10 mg by mouth daily.      bisacodyl 5 MG EC  tablet   Commonly known as: DULCOLAX   Take 1 tablet (5 mg total) by mouth daily as needed for constipation.      digoxin 0.125 MG tablet   Commonly known as: LANOXIN   Take 1 tablet (125 mcg total) by mouth daily.      HYDROcodone-acetaminophen 10-325 MG per tablet   Commonly known as: NORCO   Take 1-2 tablets by mouth every 6 (six) hours as needed for pain.      sennosides-docusate sodium 8.6-50 MG tablet   Commonly known as: SENOKOT-S   Take 1 tablet by mouth daily.      triamterene-hydrochlorothiazide 37.5-25 MG per tablet   Commonly known as: MAXZIDE-25   Take 1 tablet by mouth daily.      UROXATRAL PO   Take 10 mg by mouth 2 (two) times daily.      warfarin 5 MG tablet   Commonly known as: COUMADIN   Take 5-7.5 mg by mouth daily at 6 PM. Take 1 tablet in Sun, tues, Fri. Take 1.5 tablet on Wed, thrus, Sat, Mon         Diagnostic Studies: Dg Pelvis  Portable  03/31/2012  *RADIOLOGY REPORT*  Clinical Data: Postop arthroplasty of the hip  PORTABLE PELVIS  Comparison: None.  Findings: Recent total hip replacement on the right with satisfactory position and alignment.  No fracture or complication. Chronic left hip replacement appears satisfactory  IMPRESSION: Satisfactory recent hip replacement on the right.   Original Report Authenticated By: Janeece Riggers, M.D.    Dg Hip Portable 1 View Right  03/31/2012  *RADIOLOGY REPORT*  Clinical Data: Hip replacement  PORTABLE RIGHT HIP - 1 VIEW  Comparison: Pelvis from 03/31/2012  Findings: Lateral view the right hip reveals satisfactory alignment of right hip prosthesis.  No fracture or immediate complication  IMPRESSION: Satisfactory right hip replacement in the lateral projection.   Original Report Authenticated By: Janeece Riggers, M.D.     Disposition: 01-Home or Self Care  Discharge Orders    Future Orders Please Complete By Expires   Diet general      Weight bearing as tolerated      Call MD / Call 911      Comments:   If you experience chest pain or shortness of breath, CALL 911 and be transported to the hospital emergency room.  If you develope a fever above 101 F, pus (white drainage) or increased drainage or redness at the wound, or calf pain, call your surgeon's office.   Discharge instructions      Comments:   Change dressing in 3 days and reapply fresh dressing, unless you have a splint (half cast).  If you have a splint/cast, just leave in place until your follow-up appointment.    Keep wounds dry for 3 weeks.  Leave steri-strips in place on skin.  Do not apply lotion or anything to the wound.   Constipation Prevention      Comments:   Drink plenty of fluids.  Prune juice may be helpful.  You may use a stool softener, such as Colace (over the counter) 100 mg twice a day.  Use MiraLax (over the counter) for constipation as needed.   Follow the hip precautions as taught in Physical Therapy       Change dressing      Comments:   You may change your dressing in 3 days, then change the dressing daily with sterile 4 x 4 inch gauze dressing and paper tape.  You may clean the incision with alcohol prior to redressing   TED hose      Comments:   Use stockings (TED hose) for 2 weeks on both leg(s).  You may remove them at night for sleeping.      Follow-up Information    Follow up with Erian Rosengren P, MD. In 2 weeks.   Contact information:   [address] [place] [address] [phone]           Signed: Eulas Post 04/03/2012, 10:12 AM

## 2012-04-03 NOTE — Progress Notes (Signed)
BP LOW 98/55 HELD AM DOSE OF ATENOLOL AND TRIAMTERENE/HCTZ

## 2012-04-03 NOTE — Progress Notes (Signed)
Pt d/c'd to [facility]. Pt belongings sent with pt's family. Son present at discharge and took belongings with him as well as pt's wife. D/c instructions sent with packet to SNF, and copy discussed with and given to son. Pt d/c'd via EMS transport.   Pt requiring 2 person assist to ambulate with walker to keep balance and prevent falling backwards, tendency to lean back at times.

## 2012-04-03 NOTE — Progress Notes (Signed)
Physical Therapy Treatment Patient Details Name: Kyle [name] MRN: [id number] DOB: 07-24-1926 Today's Date: 04/03/2012 Time: 4098-1191 PT Time Calculation (min): 43 min  PT Assessment / Plan / Recommendation Comments on Treatment Session  Patient weaker this session from fatique. Inncreasd time to speak with family reguarding discharge plans and safety concerns with patient discharging home    Follow Up Recommendations  SNF     Does the patient have the potential to tolerate intense rehabilitation     Barriers to Discharge        Equipment Recommendations  3 in 1 bedside comode    Recommendations for Other Services    Frequency 7X/week   Plan Discharge plan remains appropriate;Frequency remains appropriate    Precautions / Restrictions Precautions Precautions: Posterior Hip Precaution Comments: Patient only able to recall no crossing however requires cues to maintain precautions with mobility Restrictions RLE Weight Bearing: Weight bearing as tolerated   Pertinent Vitals/Pain     Mobility  Bed Mobility Bed Mobility: Not assessed Supine to Sit: 3: Mod assist;With rails Sitting - Scoot to Edge of Bed: 3: Mod assist;With rail Details for Bed Mobility Assistance: Patient requiring Mod A this morning to assist with shoulders and trucal control up into sitting postion. Cues throughout for sequency and precautions Transfers Sit to Stand: 1: +2 Total assist;From chair/3-in-1;With armrests Sit to Stand: Patient Percentage: 50% Stand to Sit: 3: Mod assist;To chair/3-in-1;With armrests Details for Transfer Assistance: A for all aspects of stand. Cues for positoning Ambulation/Gait Ambulation/Gait Assistance: Not tested (comment) Ambulation Distance (Feet): 120 Feet (+1 for chair and line) Assistive device: Rolling walker Ambulation/Gait Assistance Details: Cues for sequency and management of RW. A for stability and balance.  Gait Pattern: Step-through pattern;Decreased stride  length;Narrow base of support;Trunk flexed;Decreased step length - right;Decreased step length - left Gait velocity: decreased    Exercises     PT Diagnosis:    PT Problem List:   PT Treatment Interventions:     PT Goals Acute Rehab PT Goals PT Goal: Supine/Side to Sit - Progress: Progressing toward goal PT Transfer Goal: Bed to Chair/Chair to Bed - Progress: Progressing toward goal PT Goal: Ambulate - Progress: Progressing toward goal Additional Goals PT Goal: Additional Goal #1 - Progress: Not progressing  Visit Information  Last PT Received On: 04/03/12 Assistance Needed: +2    Subjective Data      Cognition  Overall Cognitive Status: Impaired Area of Impairment: Awareness of deficits;Safety/judgement;Memory Arousal/Alertness: Awake/alert Orientation Level: Time;Disoriented to Behavior During Session: [facility] for tasks performed Memory: Decreased recall of precautions Safety/Judgement: Decreased awareness of safety precautions;Decreased safety judgement for tasks assessed Awareness of Errors: Assistance required to identify errors made;Assistance required to correct errors made Cognition - Other Comments: Patient stating, "where did saturday and sunday go to"  Patient thinks that he had already stated here over the weekend. Patient also asking while PT was seeing him at night when it was just 10am    Balance     End of Session PT - End of Session Equipment Utilized During Treatment: Gait belt Activity Tolerance: Patient limited by fatigue;Patient limited by pain Patient left: in chair;with call bell/phone within reach;with family/visitor present Nurse Communication: Mobility status   GP     Fredrich Birks 04/03/2012, 2:38 PM  04/03/2012 Fredrich Birks PTA [phone] pager [phone] office

## 2012-04-03 NOTE — Progress Notes (Signed)
@  1:10am while outside room 5N18 charting heard loud sound in room 5N18, went to check pt noted sitting on the floor in the BR wife standing behind him. Wife apparently assisted pt to BR without asking for staff`s assistance. Pt stated " I was looking on the mirror and fell backward." Pt denies c/o hurting on the right operative hip. Pt denies hitting his head. Vital signs stable.Wife stated " He did not fall hard, I was holding on to his belt." Noted with 3 skin tears along left elbow and also 1 small abrasion to right elbow. Area cleansed with NS and covered with mepilex. Paged on call Dr. Alveda Reasons and made aware of the pt incident and no new orders made.

## 2012-05-01 ENCOUNTER — Telehealth: Payer: Self-pay | Admitting: Nurse Practitioner

## 2012-05-01 NOTE — Telephone Encounter (Signed)
New Problem:    Called in to state that they would be handling the patients coumadin and are scheduled to check it on 05/06/12.  Please call back if you have any questions.

## 2012-05-04 ENCOUNTER — Ambulatory Visit: Payer: Self-pay | Admitting: Cardiology

## 2012-05-04 DIAGNOSIS — I4891 Unspecified atrial fibrillation: Secondary | ICD-10-CM

## 2012-05-11 ENCOUNTER — Ambulatory Visit: Payer: Self-pay | Admitting: Cardiovascular Disease

## 2012-05-11 DIAGNOSIS — I4891 Unspecified atrial fibrillation: Secondary | ICD-10-CM

## 2012-05-11 NOTE — Patient Instructions (Signed)
Instructed to eat some leafy green vegetables today due to elevated INR

## 2012-05-18 ENCOUNTER — Ambulatory Visit: Payer: Self-pay | Admitting: Cardiology

## 2012-05-18 DIAGNOSIS — I4891 Unspecified atrial fibrillation: Secondary | ICD-10-CM

## 2012-05-18 LAB — POCT INR: INR: 2.6

## 2012-05-25 ENCOUNTER — Ambulatory Visit: Payer: Self-pay | Admitting: Internal Medicine

## 2012-05-25 DIAGNOSIS — I4891 Unspecified atrial fibrillation: Secondary | ICD-10-CM

## 2012-06-08 ENCOUNTER — Ambulatory Visit (INDEPENDENT_AMBULATORY_CARE_PROVIDER_SITE_OTHER): Payer: Medicare Other | Admitting: *Deleted

## 2012-06-08 DIAGNOSIS — I4891 Unspecified atrial fibrillation: Secondary | ICD-10-CM

## 2012-07-07 ENCOUNTER — Telehealth: Payer: Self-pay | Admitting: Nurse Practitioner

## 2012-07-07 ENCOUNTER — Ambulatory Visit (INDEPENDENT_AMBULATORY_CARE_PROVIDER_SITE_OTHER): Payer: Medicare Other | Admitting: *Deleted

## 2012-07-07 DIAGNOSIS — I4891 Unspecified atrial fibrillation: Secondary | ICD-10-CM

## 2012-07-07 LAB — POCT INR: INR: 2.2

## 2012-07-07 NOTE — Telephone Encounter (Signed)
Walk in pt Form " [facility]" Dropped Off by Pt Took  To L.Gerhardt  07/07/12/Km

## 2012-07-08 ENCOUNTER — Other Ambulatory Visit: Payer: Self-pay | Admitting: *Deleted

## 2012-07-08 ENCOUNTER — Telehealth: Payer: Self-pay | Admitting: *Deleted

## 2012-07-08 MED ORDER — DIGOXIN 125 MCG PO TABS: 125.0000 ug | ORAL_TABLET | Freq: Every day | ORAL | Status: DC

## 2012-07-08 NOTE — Telephone Encounter (Signed)
S/w pt today about filling his script for digoxin  [facility], I will send that in today and made an appt. For pt per Lawson Fiscal, hadn't been seen in awhile.

## 2012-08-04 ENCOUNTER — Encounter: Payer: Self-pay | Admitting: Gastroenterology

## 2012-08-05 ENCOUNTER — Telehealth: Payer: Self-pay | Admitting: Nurse Practitioner

## 2012-08-05 NOTE — Telephone Encounter (Signed)
Spoke at length with patient regarding him stopping his coumadin 08/03/2012, he describes a dark blue black bruise  left arm from wrist to elbow, he states it is resolving since he noticed this Monday morning, we discussed bruising vs risks of stroke by stopping his coumadin, he has no other symptoms of bleeding, he does not want to come to coumadin clinic today for INR check, he states he has appt with Lawson Fiscal on Monday and will have his INR checked then, even with encouragement he will not come here this week, he does state after talking with me he will start his coumadin back per his prior instructed dose, last coumadin visit 07/07/2012. Will route to [employer].

## 2012-08-05 NOTE — Telephone Encounter (Signed)
New Prob   Pt believes he has been breaking out due to a reaction from his coumadin so he stopped taking it. Wanted to let Lawson Fiscal know. Would like to speak to nurse.

## 2012-08-05 NOTE — Telephone Encounter (Signed)
Will forward to the coumadin clinic to address.

## 2012-08-06 ENCOUNTER — Encounter (INDEPENDENT_AMBULATORY_CARE_PROVIDER_SITE_OTHER): Payer: Medicare Other | Admitting: *Deleted

## 2012-08-06 ENCOUNTER — Encounter: Payer: Self-pay | Admitting: Family Medicine

## 2012-08-06 DIAGNOSIS — M79609 Pain in unspecified limb: Secondary | ICD-10-CM

## 2012-08-06 NOTE — Telephone Encounter (Signed)
Noted. Will review at his OV next week.

## 2012-08-10 ENCOUNTER — Other Ambulatory Visit: Payer: Self-pay | Admitting: Internal Medicine

## 2012-08-10 ENCOUNTER — Ambulatory Visit
Admission: RE | Admit: 2012-08-10 | Discharge: 2012-08-10 | Disposition: A | Discharge status: Home / Self Care | Payer: Medicare Other | Source: Ambulatory Visit | Attending: Internal Medicine | Admitting: Internal Medicine

## 2012-08-10 ENCOUNTER — Ambulatory Visit (INDEPENDENT_AMBULATORY_CARE_PROVIDER_SITE_OTHER): Payer: Medicare Other | Admitting: Pharmacist

## 2012-08-10 ENCOUNTER — Encounter: Payer: Self-pay | Admitting: Nurse Practitioner

## 2012-08-10 ENCOUNTER — Ambulatory Visit (INDEPENDENT_AMBULATORY_CARE_PROVIDER_SITE_OTHER): Payer: Medicare Other | Admitting: Nurse Practitioner

## 2012-08-10 VITALS — BP 90/58 | HR 68 | Ht 73.0 in | Wt 164.0 lb

## 2012-08-10 DIAGNOSIS — I951 Orthostatic hypotension: Secondary | ICD-10-CM

## 2012-08-10 DIAGNOSIS — M79604 Pain in right leg: Secondary | ICD-10-CM

## 2012-08-10 DIAGNOSIS — I4891 Unspecified atrial fibrillation: Secondary | ICD-10-CM

## 2012-08-10 DIAGNOSIS — R0989 Other specified symptoms and signs involving the circulatory and respiratory systems: Secondary | ICD-10-CM

## 2012-08-10 LAB — CBC WITH DIFFERENTIAL/PLATELET
Basophils Absolute: 0 10*3/uL (ref 0.0–0.1)
Basophils Relative: 0.4 % (ref 0.0–3.0)
Eosinophils Absolute: 0.1 10*3/uL (ref 0.0–0.7)
Eosinophils Relative: 1 % (ref 0.0–5.0)
HCT: 35.3 % — ABNORMAL LOW (ref 39.0–52.0)
Hemoglobin: 11.6 g/dL — ABNORMAL LOW (ref 13.0–17.0)
Lymphocytes Relative: 32.7 % (ref 12.0–46.0)
Lymphs Abs: 2.3 10*3/uL (ref 0.7–4.0)
MCHC: 32.9 g/dL (ref 30.0–36.0)
MCV: 89.3 fl (ref 78.0–100.0)
Monocytes Absolute: 0.8 10*3/uL (ref 0.1–1.0)
Monocytes Relative: 11.7 % (ref 3.0–12.0)
Neutro Abs: 3.9 10*3/uL (ref 1.4–7.7)
Neutrophils Relative %: 54.2 % (ref 43.0–77.0)
Platelets: 247 10*3/uL (ref 150.0–400.0)
RBC: 3.95 Mil/uL — ABNORMAL LOW (ref 4.22–5.81)
RDW: 15.9 % — ABNORMAL HIGH (ref 11.5–14.6)
WBC: 7.2 10*3/uL (ref 4.5–10.5)

## 2012-08-10 LAB — BASIC METABOLIC PANEL
BUN: 23 mg/dL (ref 6–23)
CO2: 31 mEq/L (ref 19–32)
Calcium: 9.1 mg/dL (ref 8.4–10.5)
Chloride: 104 mEq/L (ref 96–112)
Creatinine, Ser: 1.2 mg/dL (ref 0.4–1.5)
GFR: 59.59 mL/min — ABNORMAL LOW (ref >60.00)
Glucose, Bld: 81 mg/dL (ref 70–99)
Potassium: 4.1 mEq/L (ref 3.5–5.1)
Sodium: 140 mEq/L (ref 135–145)

## 2012-08-10 NOTE — Progress Notes (Signed)
Kyle [name] Date of Birth: 04/09/1927 Medical Record #[id number]  History of Present Illness: Kyle [name] is seen back today for a follow up visit. He is seen for Dr. Jens Som. He has no known CAD. He has COPD, PAF, on coumadin, HTN, HLD and OA. Also with bradycardia - asymptomatic. Last stress study in May of 2012 and was felt to be normal with small mild defects consistent with inferoseptal and apical thinning but no ischemia. Study not gated. Last echo in 2011 showed an EF of 50 to 55% with mild LVH, mild MR and RAE.   I saw him last November for a pre op clearance. He says he did fine with his THR.   He is here today with his wife. It is his regular visit. No chest pain or shortness of breath. Says he feels a little woozy today. BP is a little low. Not checking at home. Woke up a week ago with extensive bruising down his left arm. He held his coumadin for a day or so. Then developed swelling in his right lower leg - had a negative doppler. For an Xray today of the right leg. Has palpable tenderness over just above the ankle on the right inner leg.    Current Outpatient Prescriptions on File Prior to Visit  Medication Sig Dispense Refill  . Alfuzosin HCl (UROXATRAL PO) Take 10 mg by mouth 2 (two) times daily.       Marland Kitchen atenolol (TENORMIN) 50 MG tablet Take 0.5 tablets (25 mg total) by mouth daily.      Marland Kitchen atorvastatin (LIPITOR) 10 MG tablet Take 10 mg by mouth daily.        . digoxin (LANOXIN) 0.125 MG tablet Take 1 tablet (125 mcg total) by mouth daily.  90 tablet  3  . triamterene-hydrochlorothiazide (MAXZIDE-25) 37.5-25 MG per tablet Take 1 tablet by mouth daily.       Marland Kitchen warfarin (COUMADIN) 5 MG tablet Take 5-7.5 mg by mouth daily at 6 PM. Take 1 tablet in Sun, tues, Fri. Take 1.5 tablet on Wed, thrus, Sat, Mon      . digoxin (LANOXIN) 0.125 MG tablet Take 1 tablet (125 mcg total) by mouth daily.  90 tablet  3   No current facility-administered medications on file prior to visit.    Allergies    Allergen Reactions  . Advair Diskus (Fluticasone-Salmeterol)   . Dilaudid (Hydromorphone Hcl)   . Morphine And Related     Past Medical History  Diagnosis Date  . PAF (paroxysmal atrial fibrillation)     Managed with rate control and coumadin  . COPD (chronic obstructive pulmonary disease)   . HTN (hypertension)   . Hyperlipemia   . OA (osteoarthritis)   . Chronic anticoagulation   . Incomplete rotator cuff tear or rupture of right shoulder, not specified as traumatic 2012  . Chronic anticoagulation   . Nocturia   . Osteoarthritis of right hip 03/31/2012  . Shortness of breath     Past Surgical History  Procedure Laterality Date  . Total hip arthroplasty  1998  . Shoulder arthroscopy  1994    RIGHT  . Cholecystectomy    . Appendectomy    . Cataract extraction w/ intraocular lens  implant, bilateral    . Right knee arthroscopy    . Total hip arthroplasty  03/31/2012    RIGHT HIP  . Total hip arthroplasty  03/31/2012    Procedure: TOTAL HIP ARTHROPLASTY;  Surgeon: Eulas Post, MD;  Location: [facility]  OR;  Service: Orthopedics;  Laterality: Right;    History  Smoking status  . Current Some Day Smoker -- 44 years  . Types: Pipe  Smokeless tobacco  . Never Used    Comment: Smoked cigarettes early age socially    History  Alcohol Use No    Family History  Problem Relation Age of Onset  . Cancer Father   . Cancer Mother   . Other Brother     SUICIDE    Review of Systems: The review of systems is per the HPI.  Reports frequent nocturia which is a chronic issue.  All other systems were reviewed and are negative.  Physical Exam: BP 90/58  Pulse 68  Ht 6\' 1"  (1.854 m)  Wt 164 lb (74.39 kg)  BMI 21.64 kg/m2 Repeat BP by me is 100/60. Patient is very pleasant and in no acute distress. He remains thin. Weight is unchanged. Skin is warm and dry. Color is normal.  HEENT is unremarkable. Normocephalic/atraumatic. PERRL. Sclera are nonicteric. Neck is supple. No  masses. No JVD. Lungs are clear. Cardiac exam shows an irregular rhythm. He is in atrial fib today. Rate is controlled. Abdomen is soft. Extremities are with trace edema on the right and none on the left. Does have palpable tenderness in the lower right leg. Some difficult ambulation noted. No gross neurologic deficits noted.  LABORATORY DATA: Pending Lab Results  Component Value Date   WBC 11.8* 04/03/2012   HGB 9.1* 04/03/2012   HCT 26.7* 04/03/2012   PLT 214 04/03/2012   GLUCOSE 136* 04/02/2012   ALT 13 09/17/2010   AST 18 09/17/2010   NA 133* 04/02/2012   K 4.0 04/02/2012   CL 99 04/02/2012   CREATININE 1.17 04/02/2012   BUN 31* 04/02/2012   CO2 24 04/02/2012   TSH 1.52 09/17/2010   INR 2.2 07/07/2012    Lab Results  Component Value Date   INR 2.2 07/07/2012   INR 2.2 06/08/2012   INR 2.8 05/25/2012    Assessment / Plan: 1. Right leg pain - for Xray later this morning.   2. PAF - in atrial fib today - not aware. Rate is ok. He is on his coumadin.  3. HTN - blood pressure a little lower today - may be from the atrial fib - I have asked him to monitor his readings at home. If consistently less than 110 - he is to call and we will cut back his medicines. I will check some labs today.   4. Chronic coumadin - some bruising - for recheck INR today. Will check CBC today as well.  Patient is agreeable to this plan and will call if any problems develop in the interim.   Rosalio Macadamia, RN, ANP-C [facility] [address] [place], [place]  [address]

## 2012-08-10 NOTE — Patient Instructions (Addendum)
I think heart wise you are doing ok  Stay on your same medicines  I am checking labs today  I would like for you to check your blood pressure at home - call if it is consistently less than 110 systolic  We will see you back in 6 months  Call the [facility] office at [phone] if you have any questions, problems or concerns.

## 2012-09-07 ENCOUNTER — Ambulatory Visit (INDEPENDENT_AMBULATORY_CARE_PROVIDER_SITE_OTHER): Payer: Medicare Other | Admitting: Pharmacist

## 2012-09-07 ENCOUNTER — Other Ambulatory Visit: Payer: Self-pay | Admitting: Pharmacist

## 2012-09-07 DIAGNOSIS — I4891 Unspecified atrial fibrillation: Secondary | ICD-10-CM

## 2012-09-07 MED ORDER — DIGOXIN 125 MCG PO TABS: 125.0000 ug | ORAL_TABLET | Freq: Every day | ORAL | Status: DC

## 2012-10-05 ENCOUNTER — Ambulatory Visit (INDEPENDENT_AMBULATORY_CARE_PROVIDER_SITE_OTHER): Payer: Medicare Other | Admitting: *Deleted

## 2012-10-05 DIAGNOSIS — I4891 Unspecified atrial fibrillation: Secondary | ICD-10-CM

## 2012-10-05 LAB — POCT INR: INR: 2.3

## 2012-11-09 ENCOUNTER — Ambulatory Visit (INDEPENDENT_AMBULATORY_CARE_PROVIDER_SITE_OTHER): Payer: Medicare Other

## 2012-11-09 DIAGNOSIS — I4891 Unspecified atrial fibrillation: Secondary | ICD-10-CM

## 2012-12-21 ENCOUNTER — Ambulatory Visit (INDEPENDENT_AMBULATORY_CARE_PROVIDER_SITE_OTHER): Payer: Medicare Other | Admitting: *Deleted

## 2012-12-21 DIAGNOSIS — I4891 Unspecified atrial fibrillation: Secondary | ICD-10-CM

## 2013-02-09 ENCOUNTER — Ambulatory Visit (INDEPENDENT_AMBULATORY_CARE_PROVIDER_SITE_OTHER): Payer: Medicare Other | Admitting: *Deleted

## 2013-02-09 ENCOUNTER — Ambulatory Visit (INDEPENDENT_AMBULATORY_CARE_PROVIDER_SITE_OTHER): Payer: Medicare Other | Admitting: Nurse Practitioner

## 2013-02-09 ENCOUNTER — Encounter: Payer: Self-pay | Admitting: Nurse Practitioner

## 2013-02-09 ENCOUNTER — Ambulatory Visit (INDEPENDENT_AMBULATORY_CARE_PROVIDER_SITE_OTHER)
Admission: RE | Admit: 2013-02-09 | Discharge: 2013-02-09 | Disposition: A | Discharge status: Home / Self Care | Payer: Medicare Other | Source: Ambulatory Visit | Attending: Nurse Practitioner | Admitting: Nurse Practitioner

## 2013-02-09 VITALS — BP 120/60 | HR 64 | Ht 73.0 in | Wt 163.0 lb

## 2013-02-09 DIAGNOSIS — I4891 Unspecified atrial fibrillation: Secondary | ICD-10-CM

## 2013-02-09 DIAGNOSIS — I6350 Cerebral infarction due to unspecified occlusion or stenosis of unspecified cerebral artery: Secondary | ICD-10-CM

## 2013-02-09 DIAGNOSIS — I639 Cerebral infarction, unspecified: Secondary | ICD-10-CM

## 2013-02-09 LAB — POCT INR: INR: 2.5

## 2013-02-09 NOTE — Patient Instructions (Addendum)
Stay on your current medicines  We need to check a scan on your head today  I will talk with Dr. Waynard Edwards  Start using your walker  Call the [facility] office at [phone] if you have any questions, problems or concerns.

## 2013-02-09 NOTE — Progress Notes (Signed)
Kyle [name] Date of Birth: 1926/05/19 Medical Record #[id number]  History of Present Illness: Kyle [name] is seen back today for his 6 month check. Seen for Kyle [name]. Former patient of Kyle [name]. Has no known CAD. Has COPD, PAF, on coumadin, HTN, HLD and OA. Has also had asymptomatic bradycardia. Last stress test in May of 2012 and was felt to be normal with small mild defects consistent with inferoseptal and apical thinning but no ischemia. Study not gated. Last echo from 2011 showed an EF of 50 to 55% with mild LVH, mild MR and RAE.  Last seen back in April. BP was a little low. Was having some issues with his right leg.   Comes back today. Here with his wife. Says he is not doing too well. Moving pretty slow. Says his hips and legs don't want to go. No chest pain. Not short of breath. No falls. Remains on his coumadin. Some mild bruising. Has his physical with Kyle [name] coming up later this month. Says he has not been there in over a year.    Current Outpatient Prescriptions  Medication Sig Dispense Refill  . Alfuzosin HCl (UROXATRAL PO) Take 10 mg by mouth 2 (two) times daily.       Marland Kitchen atenolol (TENORMIN) 50 MG tablet Take 0.5 tablets (25 mg total) by mouth daily.      Marland Kitchen atorvastatin (LIPITOR) 10 MG tablet Take 10 mg by mouth daily.        . digoxin (LANOXIN) 0.125 MG tablet Take 1 tablet (125 mcg total) by mouth daily.  90 tablet  3  . triamterene-hydrochlorothiazide (MAXZIDE-25) 37.5-25 MG per tablet Take 1 tablet by mouth daily.       Marland Kitchen warfarin (COUMADIN) 5 MG tablet Take 5-7.5 mg by mouth daily at 6 PM. Take 1 tablet in Sun, tues, Fri. Take 1.5 tablet on Wed, thrus, Sat, Mon      . digoxin (LANOXIN) 0.125 MG tablet Take 1 tablet (125 mcg total) by mouth daily.  90 tablet  3   No current facility-administered medications for this visit.    Allergies  Allergen Reactions  . Advair Diskus [Fluticasone-Salmeterol]   . Dilaudid [Hydromorphone Hcl]   . Morphine And Related      Past Medical History  Diagnosis Date  . PAF (paroxysmal atrial fibrillation)     Managed with rate control and coumadin  . COPD (chronic obstructive pulmonary disease)   . HTN (hypertension)   . Hyperlipemia   . OA (osteoarthritis)   . Chronic anticoagulation   . Incomplete rotator cuff tear or rupture of right shoulder, not specified as traumatic 2012  . Chronic anticoagulation   . Nocturia   . Osteoarthritis of right hip 03/31/2012  . Shortness of breath     Past Surgical History  Procedure Laterality Date  . Total hip arthroplasty  1998  . Shoulder arthroscopy  1994    RIGHT  . Cholecystectomy    . Appendectomy    . Cataract extraction w/ intraocular lens  implant, bilateral    . Right knee arthroscopy    . Total hip arthroplasty  03/31/2012    RIGHT HIP  . Total hip arthroplasty  03/31/2012    Procedure: TOTAL HIP ARTHROPLASTY;  Surgeon: Eulas Post, MD;  Location: [facility] OR;  Service: Orthopedics;  Laterality: Right;    History  Smoking status  . Current Some Day Smoker -- 44 years  . Types: Pipe  Smokeless tobacco  . Never Used  Comment: Smoked cigarettes early age socially    History  Alcohol Use No    Family History  Problem Relation Age of Onset  . Cancer Father   . Cancer Mother   . Other Brother     SUICIDE    Review of Systems: The review of systems is per the HPI.  All other systems were reviewed and are negative.  Physical Exam: BP 120/60  Pulse 64  Ht 6\' 1"  (1.854 m)  Wt 163 lb (73.936 kg)  BMI 21.51 kg/m2 Patient is very pleasant and in no acute distress. Moving pretty slow today. Skin is warm and dry. Color is normal.  HEENT is unremarkable but he has a definite left mouth droop. Normocephalic/atraumatic. PERRL. Sclera are nonicteric. Neck is supple. No masses. No JVD. Lungs are clear. Cardiac exam shows a regular rhythm today. Rate of 64. Abdomen is soft. Extremities are without edema. Gait and ROM are intact but he shuffles and  drags his feet. No gross neurologic deficits noted - his strength appears symmetrical.  LABORATORY DATA: Lab Results  Component Value Date   WBC 7.2 08/10/2012   HGB 11.6* 08/10/2012   HCT 35.3* 08/10/2012   PLT 247.0 08/10/2012   GLUCOSE 81 08/10/2012   ALT 13 09/17/2010   AST 18 09/17/2010   NA 140 08/10/2012   K 4.1 08/10/2012   CL 104 08/10/2012   CREATININE 1.2 08/10/2012   BUN 23 08/10/2012   CO2 31 08/10/2012   TSH 1.52 09/17/2010   INR 2.0 12/21/2012   Lab Results  Component Value Date   INR 2.0 12/21/2012   INR 2.3 11/09/2012   INR 2.3 10/05/2012     Assessment / Plan: 1. PAF - managed with rate control and coumadin - he is in sinus today on exam.   2. HTN - BP ok today  3. Chronic anticoagulation - has had good INR's  4. Advancing age   79. Mouth droop - clearly not walking as well either - will check CT of the head. Need to talk with Kyle [name].   Patient is agreeable to this plan and will call if any problems develop in the interim.   Rosalio Macadamia, RN, ANP-C [facility] [address] [place], [place]  [address]

## 2013-02-10 ENCOUNTER — Telehealth: Payer: Self-pay | Admitting: Nurse Practitioner

## 2013-02-10 NOTE — Telephone Encounter (Signed)
New Problem  Pt states the Dr. Lanetta Inch a difference in the left side of his face and he has additional information.(no further details) . Please call back to discuss.

## 2013-02-10 NOTE — Telephone Encounter (Signed)
S/w pt wanted to let Lawson Fiscal know when pt is out in the sun notice's a halo on the left cheek and left eye that looks like a contact lens pt stated last march the day before easter pt had a head fall first in driveway and before pt knew it ems was there stated maybe this is what Lawson Fiscal saw, I will route to [employer]

## 2013-02-10 NOTE — Telephone Encounter (Signed)
S/w pt explained Lawson Fiscal didn't think the incident was related to what happened last year pt stated will be going to Dr. Laurey Morale office tomorrow for a pre-physical will discuss MRI to find out when that test will be taking place

## 2013-02-10 NOTE — Telephone Encounter (Signed)
Tell him thanks for the info but I don't think this is related. Will see what the MRI shows that Dr. Waynard Edwards is to order.

## 2013-03-23 ENCOUNTER — Ambulatory Visit (INDEPENDENT_AMBULATORY_CARE_PROVIDER_SITE_OTHER): Payer: Medicare Other | Admitting: Pharmacist

## 2013-03-23 DIAGNOSIS — I4891 Unspecified atrial fibrillation: Secondary | ICD-10-CM

## 2013-03-23 LAB — POCT INR: INR: 2.6

## 2013-05-04 ENCOUNTER — Ambulatory Visit (INDEPENDENT_AMBULATORY_CARE_PROVIDER_SITE_OTHER): Payer: Medicare Other | Admitting: *Deleted

## 2013-05-04 DIAGNOSIS — I4891 Unspecified atrial fibrillation: Secondary | ICD-10-CM

## 2013-05-04 LAB — POCT INR: INR: 2.4

## 2013-06-21 ENCOUNTER — Ambulatory Visit (INDEPENDENT_AMBULATORY_CARE_PROVIDER_SITE_OTHER): Payer: Medicare Other | Admitting: Pharmacist

## 2013-06-21 DIAGNOSIS — I4891 Unspecified atrial fibrillation: Secondary | ICD-10-CM

## 2013-06-21 LAB — POCT INR: INR: 2.6

## 2013-08-02 ENCOUNTER — Ambulatory Visit (INDEPENDENT_AMBULATORY_CARE_PROVIDER_SITE_OTHER): Payer: Medicare Other | Admitting: Pharmacist

## 2013-08-02 DIAGNOSIS — I4891 Unspecified atrial fibrillation: Secondary | ICD-10-CM

## 2013-08-02 LAB — POCT INR: INR: 2.6

## 2013-09-13 ENCOUNTER — Ambulatory Visit (INDEPENDENT_AMBULATORY_CARE_PROVIDER_SITE_OTHER): Payer: Medicare Other | Admitting: Pharmacist

## 2013-09-13 DIAGNOSIS — I4891 Unspecified atrial fibrillation: Secondary | ICD-10-CM

## 2013-09-13 LAB — POCT INR: INR: 2.6

## 2013-10-25 ENCOUNTER — Ambulatory Visit (INDEPENDENT_AMBULATORY_CARE_PROVIDER_SITE_OTHER): Payer: Medicare Other | Admitting: *Deleted

## 2013-10-25 DIAGNOSIS — I4891 Unspecified atrial fibrillation: Secondary | ICD-10-CM

## 2013-10-25 LAB — POCT INR: INR: 3.1

## 2013-12-06 ENCOUNTER — Ambulatory Visit (INDEPENDENT_AMBULATORY_CARE_PROVIDER_SITE_OTHER): Payer: Medicare Other | Admitting: *Deleted

## 2013-12-06 DIAGNOSIS — I4891 Unspecified atrial fibrillation: Secondary | ICD-10-CM

## 2013-12-06 LAB — POCT INR: INR: 4

## 2013-12-21 ENCOUNTER — Ambulatory Visit (INDEPENDENT_AMBULATORY_CARE_PROVIDER_SITE_OTHER): Payer: Medicare Other

## 2013-12-21 DIAGNOSIS — I4891 Unspecified atrial fibrillation: Secondary | ICD-10-CM

## 2013-12-21 LAB — POCT INR: INR: 2.3

## 2014-01-18 ENCOUNTER — Ambulatory Visit (INDEPENDENT_AMBULATORY_CARE_PROVIDER_SITE_OTHER): Payer: Medicare Other

## 2014-01-18 DIAGNOSIS — I4891 Unspecified atrial fibrillation: Secondary | ICD-10-CM

## 2014-01-18 LAB — POCT INR: INR: 2.8

## 2014-02-22 ENCOUNTER — Ambulatory Visit (INDEPENDENT_AMBULATORY_CARE_PROVIDER_SITE_OTHER): Payer: Medicare Other | Admitting: Pharmacist Clinician (PhC)/ Clinical Pharmacy Specialist

## 2014-02-22 DIAGNOSIS — I4891 Unspecified atrial fibrillation: Secondary | ICD-10-CM

## 2014-02-22 LAB — POCT INR: INR: 2.5

## 2014-03-17 ENCOUNTER — Encounter: Payer: Self-pay | Admitting: Nurse Practitioner

## 2014-04-05 ENCOUNTER — Ambulatory Visit (INDEPENDENT_AMBULATORY_CARE_PROVIDER_SITE_OTHER): Payer: Medicare Other

## 2014-04-05 DIAGNOSIS — I4891 Unspecified atrial fibrillation: Secondary | ICD-10-CM

## 2014-04-05 LAB — POCT INR: INR: 2.8

## 2014-05-17 ENCOUNTER — Ambulatory Visit (INDEPENDENT_AMBULATORY_CARE_PROVIDER_SITE_OTHER): Payer: Medicare Other | Admitting: Pharmacist

## 2014-05-17 DIAGNOSIS — I4891 Unspecified atrial fibrillation: Secondary | ICD-10-CM

## 2014-05-17 LAB — POCT INR: INR: 4.5

## 2014-05-18 ENCOUNTER — Inpatient Hospital Stay (HOSPITAL_COMMUNITY)
Admission: EM | Admit: 2014-05-18 | Discharge: 2014-05-21 | DRG: 536 | Disposition: A | Discharge status: Skilled Nursing Facility | Payer: Medicare Other | Attending: Internal Medicine | Admitting: Internal Medicine

## 2014-05-18 ENCOUNTER — Encounter (HOSPITAL_COMMUNITY): Payer: Self-pay | Admitting: Emergency Medicine

## 2014-05-18 ENCOUNTER — Emergency Department (HOSPITAL_COMMUNITY): Payer: Medicare Other

## 2014-05-18 DIAGNOSIS — R296 Repeated falls: Secondary | ICD-10-CM | POA: Diagnosis present

## 2014-05-18 DIAGNOSIS — Z96643 Presence of artificial hip joint, bilateral: Secondary | ICD-10-CM | POA: Diagnosis present

## 2014-05-18 DIAGNOSIS — I10 Essential (primary) hypertension: Secondary | ICD-10-CM

## 2014-05-18 DIAGNOSIS — Z885 Allergy status to narcotic agent status: Secondary | ICD-10-CM | POA: Diagnosis not present

## 2014-05-18 DIAGNOSIS — N400 Enlarged prostate without lower urinary tract symptoms: Secondary | ICD-10-CM | POA: Diagnosis present

## 2014-05-18 DIAGNOSIS — M25559 Pain in unspecified hip: Secondary | ICD-10-CM

## 2014-05-18 DIAGNOSIS — W19XXXA Unspecified fall, initial encounter: Secondary | ICD-10-CM | POA: Diagnosis present

## 2014-05-18 DIAGNOSIS — S72002A Fracture of unspecified part of neck of left femur, initial encounter for closed fracture: Principal | ICD-10-CM | POA: Diagnosis present

## 2014-05-18 DIAGNOSIS — M1611 Unilateral primary osteoarthritis, right hip: Secondary | ICD-10-CM | POA: Diagnosis present

## 2014-05-18 DIAGNOSIS — E785 Hyperlipidemia, unspecified: Secondary | ICD-10-CM | POA: Diagnosis present

## 2014-05-18 DIAGNOSIS — M25532 Pain in left wrist: Secondary | ICD-10-CM

## 2014-05-18 DIAGNOSIS — Z720 Tobacco use: Secondary | ICD-10-CM

## 2014-05-18 DIAGNOSIS — Z79899 Other long term (current) drug therapy: Secondary | ICD-10-CM | POA: Diagnosis not present

## 2014-05-18 DIAGNOSIS — R52 Pain, unspecified: Secondary | ICD-10-CM

## 2014-05-18 DIAGNOSIS — Z7901 Long term (current) use of anticoagulants: Secondary | ICD-10-CM

## 2014-05-18 DIAGNOSIS — S7292XA Unspecified fracture of left femur, initial encounter for closed fracture: Secondary | ICD-10-CM

## 2014-05-18 DIAGNOSIS — I4891 Unspecified atrial fibrillation: Secondary | ICD-10-CM | POA: Diagnosis present

## 2014-05-18 DIAGNOSIS — J449 Chronic obstructive pulmonary disease, unspecified: Secondary | ICD-10-CM | POA: Diagnosis present

## 2014-05-18 DIAGNOSIS — I480 Paroxysmal atrial fibrillation: Secondary | ICD-10-CM | POA: Diagnosis present

## 2014-05-18 DIAGNOSIS — Z9049 Acquired absence of other specified parts of digestive tract: Secondary | ICD-10-CM | POA: Diagnosis present

## 2014-05-18 DIAGNOSIS — Z888 Allergy status to other drugs, medicaments and biological substances status: Secondary | ICD-10-CM

## 2014-05-18 LAB — CBC WITH DIFFERENTIAL/PLATELET
Basophils Absolute: 0 10*3/uL (ref 0.0–0.1)
Basophils Relative: 0 % (ref 0–1)
EOS ABS: 0.1 10*3/uL (ref 0.0–0.7)
EOS PCT: 1 % (ref 0–5)
HCT: 35.8 % — ABNORMAL LOW (ref 39.0–52.0)
Hemoglobin: 11.9 g/dL — ABNORMAL LOW (ref 13.0–17.0)
Lymphocytes Relative: 19 % (ref 12–46)
Lymphs Abs: 2.4 10*3/uL (ref 0.7–4.0)
MCH: 30.1 pg (ref 26.0–34.0)
MCHC: 33.2 g/dL (ref 30.0–36.0)
MCV: 90.4 fL (ref 78.0–100.0)
MONOS PCT: 6 % (ref 3–12)
Monocytes Absolute: 0.8 10*3/uL (ref 0.1–1.0)
Neutro Abs: 9.4 10*3/uL — ABNORMAL HIGH (ref 1.7–7.7)
Neutrophils Relative %: 74 % (ref 43–77)
PLATELETS: 325 10*3/uL (ref 150–400)
RBC: 3.96 MIL/uL — ABNORMAL LOW (ref 4.22–5.81)
RDW: 15 % (ref 11.5–15.5)
WBC: 12.6 10*3/uL — ABNORMAL HIGH (ref 4.0–10.5)

## 2014-05-18 LAB — BASIC METABOLIC PANEL
ANION GAP: 11 (ref 5–15)
BUN: 31 mg/dL — ABNORMAL HIGH (ref 6–23)
CO2: 25 mmol/L (ref 19–32)
CREATININE: 1.15 mg/dL (ref 0.50–1.35)
Calcium: 9.2 mg/dL (ref 8.4–10.5)
Chloride: 103 mEq/L (ref 96–112)
GFR, EST AFRICAN AMERICAN: 65 mL/min — AB (ref >90)
GFR, EST NON AFRICAN AMERICAN: 56 mL/min — AB (ref >90)
Glucose, Bld: 102 mg/dL — ABNORMAL HIGH (ref 70–99)
Potassium: 4.2 mmol/L (ref 3.5–5.1)
Sodium: 139 mmol/L (ref 135–145)

## 2014-05-18 LAB — TYPE AND SCREEN
ABO/RH(D): A POS
Antibody Screen: NEGATIVE

## 2014-05-18 LAB — PROTIME-INR
INR: 3.46 — AB (ref 0.00–1.49)
Prothrombin Time: 35.1 seconds — ABNORMAL HIGH (ref 11.6–15.2)

## 2014-05-18 MED ORDER — FENTANYL CITRATE 0.05 MG/ML IJ SOLN
25.0000 ug | INTRAMUSCULAR | Status: DC | PRN
  Administered 2014-05-19: 25 ug via INTRAVENOUS
  Administered 2014-05-19: 50 ug via INTRAVENOUS
  Filled 2014-05-18 (×2): qty 2

## 2014-05-18 MED ORDER — HYDROCODONE-ACETAMINOPHEN 5-325 MG PO TABS
1.0000 | ORAL_TABLET | Freq: Four times a day (QID) | ORAL | Status: DC | PRN
  Administered 2014-05-18: 1 via ORAL
  Administered 2014-05-19 – 2014-05-20 (×6): 2 via ORAL
  Filled 2014-05-18 (×7): qty 2
  Filled 2014-05-18: qty 1

## 2014-05-18 MED ORDER — ALFUZOSIN HCL ER 10 MG PO TB24
10.0000 mg | ORAL_TABLET | Freq: Two times a day (BID) | ORAL | Status: DC
  Administered 2014-05-19 – 2014-05-21 (×6): 10 mg via ORAL
  Filled 2014-05-18 (×7): qty 1

## 2014-05-18 MED ORDER — ATORVASTATIN CALCIUM 10 MG PO TABS
10.0000 mg | ORAL_TABLET | Freq: Every day | ORAL | Status: DC
  Administered 2014-05-19 – 2014-05-21 (×3): 10 mg via ORAL
  Filled 2014-05-18 (×5): qty 1

## 2014-05-18 MED ORDER — ATENOLOL 25 MG PO TABS
25.0000 mg | ORAL_TABLET | Freq: Every day | ORAL | Status: DC
  Administered 2014-05-19 – 2014-05-21 (×3): 25 mg via ORAL
  Filled 2014-05-18 (×5): qty 1

## 2014-05-18 MED ORDER — DIGOXIN 125 MCG PO TABS
125.0000 ug | ORAL_TABLET | Freq: Every day | ORAL | Status: DC
  Administered 2014-05-19 – 2014-05-21 (×3): 125 ug via ORAL
  Filled 2014-05-18 (×3): qty 1

## 2014-05-18 MED ORDER — MORPHINE SULFATE 2 MG/ML IJ SOLN: 0.5000 mg | INTRAMUSCULAR | Status: DC | PRN

## 2014-05-18 MED ORDER — METHOCARBAMOL 1000 MG/10ML IJ SOLN
500.0000 mg | Freq: Four times a day (QID) | INTRAMUSCULAR | Status: DC | PRN
  Administered 2014-05-19: 500 mg via INTRAVENOUS
  Filled 2014-05-18 (×2): qty 5

## 2014-05-18 NOTE — H&P (Addendum)
[facility] History and Physical  Kyle [name] ZOX:[id number] DOB: 10/17/1926 DOA: 05/18/2014  Referring physician: EDP PCP: Ezequiel KayserPERINI,MARK A, MD   Chief Complaint: Fall, hip pain   HPI: Kyle [name] is a 81 y.o. male with h/o B hip replacements in the past, L hip prosthesis in 1998, presents to ED after suffering a mechanical fall in his yard earlier today.  He has had L hip pain and inability to ambulate on it since that time.  Work up in ED demonstrates non-displaced L hip fractures on CT scan.  Review of Systems: Systems reviewed.  As above, otherwise negative  Past Medical History  Diagnosis Date  . PAF (paroxysmal atrial fibrillation)     Managed with rate control and coumadin  . COPD (chronic obstructive pulmonary disease)   . HTN (hypertension)   . Hyperlipemia   . OA (osteoarthritis)   . Chronic anticoagulation   . Incomplete rotator cuff tear or rupture of right shoulder, not specified as traumatic 2012  . Chronic anticoagulation   . Nocturia   . Osteoarthritis of right hip 03/31/2012  . Shortness of breath    Past Surgical History  Procedure Laterality Date  . Total hip arthroplasty  1998  . Shoulder arthroscopy  1994    RIGHT  . Cholecystectomy    . Appendectomy    . Cataract extraction w/ intraocular lens  implant, bilateral    . Right knee arthroscopy    . Total hip arthroplasty  03/31/2012    RIGHT HIP  . Total hip arthroplasty  03/31/2012    Procedure: TOTAL HIP ARTHROPLASTY;  Surgeon: Eulas PostJoshua P Landau, MD;  Location: [facility] OR;  Service: Orthopedics;  Laterality: Right;   Social History:  reports that he has been smoking Pipe.  He has never used smokeless tobacco. He reports that he does not drink alcohol or use illicit drugs.  Allergies  Allergen Reactions  . Advair Diskus [Fluticasone-Salmeterol] Other (See Comments)  . Dilaudid [Hydromorphone Hcl] Other (See Comments)    Makes patient very loopy and disoriented  . Morphine And Related Other (See Comments)     Family History  Problem Relation Age of Onset  . Cancer Father   . Cancer Mother   . Other Brother     SUICIDE     Prior to Admission medications   Medication Sig Start Date End Date Taking? Authorizing Provider  Alfuzosin HCl (UROXATRAL PO) Take 10 mg by mouth 2 (two) times daily.    Yes Historical Provider, MD  atenolol (TENORMIN) 50 MG tablet Take 0.5 tablets (25 mg total) by mouth daily. 09/25/10  Yes Rosalio MacadamiaLori C Gerhardt, NP  atorvastatin (LIPITOR) 10 MG tablet Take 10 mg by mouth daily.     Yes Historical Provider, MD  digoxin (LANOXIN) 0.125 MG tablet Take 1 tablet (125 mcg total) by mouth daily. 09/27/10 05/18/14 Yes Roger ShelterStanley [name], MD  warfarin (COUMADIN) 5 MG tablet Take 5-7.5 mg by mouth daily at 6 PM. Takes 7.5mg  on tues, thurs and sat Takes 5mg  all other days   Yes Historical Provider, MD   Physical Exam: Filed Vitals:   05/18/14 2030  BP: 132/79  Pulse: 80  Resp: 18    BP 132/79 mmHg  Pulse 80  Resp 18  SpO2 98%  General Appearance:    Alert, oriented, no distress, appears stated age  Head:    Normocephalic, atraumatic  Eyes:    PERRL, EOMI, sclera non-icteric        Nose:   Nares without  drainage or epistaxis. Mucosa, turbinates normal  Throat:   Moist mucous membranes. Oropharynx without erythema or exudate.  Neck:   Supple. No carotid bruits.  No thyromegaly.  No lymphadenopathy.   Back:     No CVA tenderness, no spinal tenderness  Lungs:     Clear to auscultation bilaterally, without wheezes, rhonchi or rales  Chest wall:    No tenderness to palpitation  Heart:    Regular rate and rhythm without murmurs, gallops, rubs  Abdomen:     Soft, non-tender, nondistended, normal bowel sounds, no organomegaly  Genitalia:    deferred  Rectal:    deferred  Extremities:   No clubbing, cyanosis or edema.  Pulses:   2+ and symmetric all extremities  Skin:   Skin color, texture, turgor normal, no rashes or lesions  Lymph nodes:   Cervical, supraclavicular, and axillary  nodes normal  Neurologic:   CNII-XII intact. Normal strength, sensation and reflexes      throughout    Labs on Admission:  Basic Metabolic Panel:  Recent Labs Lab 05/18/14 1500  NA 139  K 4.2  CL 103  CO2 25  GLUCOSE 102*  BUN 31*  CREATININE 1.15  CALCIUM 9.2   Liver Function Tests: No results for input(s): AST, ALT, ALKPHOS, BILITOT, PROT, ALBUMIN in the last 168 hours. No results for input(s): LIPASE, AMYLASE in the last 168 hours. No results for input(s): AMMONIA in the last 168 hours. CBC:  Recent Labs Lab 05/18/14 1500  WBC 12.6*  NEUTROABS 9.4*  HGB 11.9*  HCT 35.8*  MCV 90.4  PLT 325   Cardiac Enzymes: No results for input(s): CKTOTAL, CKMB, CKMBINDEX, TROPONINI in the last 168 hours.  BNP (last 3 results) No results for input(s): PROBNP in the last 8760 hours. CBG: No results for input(s): GLUCAP in the last 168 hours.  Radiological Exams on Admission: Dg Chest 1 View  05/18/2014   CLINICAL DATA:  Fall, left hip pain  EXAM: CHEST - 1 VIEW  COMPARISON:  08/14/2011  FINDINGS: Cardiomediastinal silhouette is stable. No acute infiltrate or pulmonary edema. Again noted hyperinflation and chronic mild interstitial prominence. Stable calcified granuloma left midlung.  IMPRESSION: No active disease. Stable hyperinflation and chronic mild interstitial prominence. Stable calcified granuloma left midlung.   Electronically Signed   By: Natasha Mead M.D.   On: 05/18/2014 15:58   Ct Hip Left Wo Contrast  05/18/2014   CLINICAL DATA:  PT. FELL, C/O HIP PAIN, HX BILATERAL HIP REPLACEMENTS, SIGNIFICANT ARTIFACT AS A RESULT,  EXAM: CT OF THE LEFT HIP WITHOUT CONTRAST  TECHNIQUE: Multidetector CT imaging of the left hip was performed according to the standard protocol. Multiplanar CT image reconstructions were also generated.  COMPARISON:  03/31/2012  FINDINGS: Acute nondisplaced fractures are noted of the proximal left femur.  A fracture extends across the posterior margin at  the posterior lateral base of the greater trochanter, with the fracture continuing along the posterior shaft along the long axis of the femur. Another nondisplaced fracture line extends along the anterior cortex from just below the trochanteric region to the level of the inferior tip of the femoral component.  There is lucency surrounding the inferior aspect of the stem of the femoral component, which was present on the prior radiographs.  Acetabular components well seated and stable. There is no evidence of an acetabular fracture.  Soft tissue edema is seen lateral to the proximal femur.  IMPRESSION: 1. Nondisplaced fractures of the proximal left femur extending  from the posterior lateral base of the greater trochanter and across the anterior and posterior cortices to the midshaft near the level of the inferior tip of the stem of the femoral component.   Electronically Signed   By: Amie Portland M.D.   On: 05/18/2014 19:38   Dg Hip Unilat With Pelvis 2-3 Views Left  05/18/2014   CLINICAL DATA:  Left hip pain post fall  EXAM: DG HIP W/ PELVIS 2-3V*L*  COMPARISON:  03/31/2012  FINDINGS: Three views of the left hip submitted. Again noted right hip prosthesis in anatomic alignment. There is a left hip prosthesis in anatomic alignment. No evidence of prosthesis loosening. No acute fracture or subluxation.  IMPRESSION: No acute fracture or subluxation. Bilateral hip prosthesis. No evidence of loosening of left hip prosthesis.   Electronically Signed   By: Natasha Mead M.D.   On: 05/18/2014 16:02   Dg Femur Min 2 Views Left  05/18/2014   CLINICAL DATA:  Fall, left hip pain  EXAM: DG FEMUR 2+V*L*  COMPARISON:  Left hip same day.  FINDINGS: Two views of mid and distal femur submitted. No acute fracture or subluxation. Partially visualized intramedullary component of left hip prosthesis without evidence of loosening.  IMPRESSION: No acute fracture or subluxation.   Electronically Signed   By: Natasha Mead M.D.   On:  05/18/2014 16:00    EKG: Independently reviewed.  Assessment/Plan Principal Problem:   Closed left hip fracture Active Problems:   Atrial fibrillation   Chronic anticoagulation   HTN (hypertension)   1. Closed L hip fracture - non-displaced fractures about the prosthesis, Ortho to consult with patient tomorrow AM and decide on plan for surgery. 1. NPO after midnight 2. Holding coumadin but not aggressively reversing at this point until ortho decides on timing of surgical management. 3. Hip fracture pathway 4. EKG pending 2. HTN - continue home meds 3. A.Fib - continue rate control, hold coumadin which is supratheraputic anyhow. 4. DVT ppx - none ordered, has supratheraputic INR from coumadin already.    Code Status: Full Code  Family Communication: No family in room Disposition Plan: Admit to inpatient   Time spent: 70 min  GARDNER, JARED M. [facility] Pager [phone]  If 7AM-7PM, please contact the day team taking care of the patient [email or web address] Password [email or web address] 05/18/2014, 9:05 PM

## 2014-05-18 NOTE — ED Provider Notes (Signed)
Imaging as below. Discussed with Dr Luiz BlareGraves, ortho. Dr Dion SaucierLandau to see in morning.  Admitted to medicine.   Dg Chest 1 View  05/18/2014   CLINICAL DATA:  Fall, left hip pain  EXAM: CHEST - 1 VIEW  COMPARISON:  08/14/2011  FINDINGS: Cardiomediastinal silhouette is stable. No acute infiltrate or pulmonary edema. Again noted hyperinflation and chronic mild interstitial prominence. Stable calcified granuloma left midlung.  IMPRESSION: No active disease. Stable hyperinflation and chronic mild interstitial prominence. Stable calcified granuloma left midlung.   Electronically Signed   By: Natasha MeadLiviu  Pop M.D.   On: 05/18/2014 15:58   Ct Hip Left Wo Contrast  05/18/2014   CLINICAL DATA:  PT. FELL, C/O HIP PAIN, HX BILATERAL HIP REPLACEMENTS, SIGNIFICANT ARTIFACT AS A RESULT,  EXAM: CT OF THE LEFT HIP WITHOUT CONTRAST  TECHNIQUE: Multidetector CT imaging of the left hip was performed according to the standard protocol. Multiplanar CT image reconstructions were also generated.  COMPARISON:  03/31/2012  FINDINGS: Acute nondisplaced fractures are noted of the proximal left femur.  A fracture extends across the posterior margin at the posterior lateral base of the greater trochanter, with the fracture continuing along the posterior shaft along the long axis of the femur. Another nondisplaced fracture line extends along the anterior cortex from just below the trochanteric region to the level of the inferior tip of the femoral component.  There is lucency surrounding the inferior aspect of the stem of the femoral component, which was present on the prior radiographs.  Acetabular components well seated and stable. There is no evidence of an acetabular fracture.  Soft tissue edema is seen lateral to the proximal femur.  IMPRESSION: 1. Nondisplaced fractures of the proximal left femur extending from the posterior lateral base of the greater trochanter and across the anterior and posterior cortices to the midshaft near the level of the  inferior tip of the stem of the femoral component.   Electronically Signed   By: Amie Portlandavid  Ormond M.D.   On: 05/18/2014 19:38   Dg Hip Unilat With Pelvis 2-3 Views Left  05/18/2014   CLINICAL DATA:  Left hip pain post fall  EXAM: DG HIP W/ PELVIS 2-3V*L*  COMPARISON:  03/31/2012  FINDINGS: Three views of the left hip submitted. Again noted right hip prosthesis in anatomic alignment. There is a left hip prosthesis in anatomic alignment. No evidence of prosthesis loosening. No acute fracture or subluxation.  IMPRESSION: No acute fracture or subluxation. Bilateral hip prosthesis. No evidence of loosening of left hip prosthesis.   Electronically Signed   By: Natasha MeadLiviu  Pop M.D.   On: 05/18/2014 16:02   Dg Femur Min 2 Views Left  05/18/2014   CLINICAL DATA:  Fall, left hip pain  EXAM: DG FEMUR 2+V*L*  COMPARISON:  Left hip same day.  FINDINGS: Two views of mid and distal femur submitted. No acute fracture or subluxation. Partially visualized intramedullary component of left hip prosthesis without evidence of loosening.  IMPRESSION: No acute fracture or subluxation.   Electronically Signed   By: Natasha MeadLiviu  Pop M.D.   On: 05/18/2014 16:00    Raeford RazorStephen Vasil Juhasz, MD 05/19/14 [phone]

## 2014-05-18 NOTE — ED Notes (Signed)
Pt still denying any pain medication at this time.

## 2014-05-18 NOTE — ED Notes (Addendum)
Pt from home via [facility] with c/o left hip pain s/p "leg giving out" and falling in his yard today.  Pt is able to sit up but unable to bear weight on left leg, initially 10/10 pain, given 100 mcg fentanyl.  Hx of hip replacement 18 years ago, EMS unable to tell if there is deformity present, pelvis appears stable on exam.  Left leg has shortening but no rotation.  Pt in NAD, A&O, on coumadin.

## 2014-05-18 NOTE — ED Notes (Signed)
MD Kohut at bedside. 

## 2014-05-18 NOTE — ED Notes (Signed)
Pt refusing to ambulate. [facility].

## 2014-05-18 NOTE — ED Provider Notes (Signed)
CSN: [id number]     Arrival date & time 05/18/14  1349 History   First MD Initiated Contact with Patient 05/18/14 1416     Chief Complaint  Patient presents with  . Hip Pain  . Fall      HPI Patient presents to the emergency department after falling in his yard earlier today.  He's been unable to ambulate on it since then.  He has bilateral hip prosthesis.  His left hip prosthesis was placed in 1998.  He is brought to the emergency department by EMS.  He was given 100 g of fentanyl prior to arrival.  He continues to have pain at this time.  His pain is focused and left hip.  Denies head injury.  No neck pain.  He is on Coumadin for history of paroxysmal atrial fibrillation.   Past Medical History  Diagnosis Date  . PAF (paroxysmal atrial fibrillation)     Managed with rate control and coumadin  . COPD (chronic obstructive pulmonary disease)   . HTN (hypertension)   . Hyperlipemia   . OA (osteoarthritis)   . Chronic anticoagulation   . Incomplete rotator cuff tear or rupture of right shoulder, not specified as traumatic 2012  . Chronic anticoagulation   . Nocturia   . Osteoarthritis of right hip 03/31/2012  . Shortness of breath    Past Surgical History  Procedure Laterality Date  . Total hip arthroplasty  1998  . Shoulder arthroscopy  1994    RIGHT  . Cholecystectomy    . Appendectomy    . Cataract extraction w/ intraocular lens  implant, bilateral    . Right knee arthroscopy    . Total hip arthroplasty  03/31/2012    RIGHT HIP  . Total hip arthroplasty  03/31/2012    Procedure: TOTAL HIP ARTHROPLASTY;  Surgeon: Eulas Post, MD;  Location: [facility] OR;  Service: Orthopedics;  Laterality: Right;   Family History  Problem Relation Age of Onset  . Cancer Father   . Cancer Mother   . Other Brother     SUICIDE   History  Substance Use Topics  . Smoking status: Current Some Day Smoker -- 44 years    Types: Pipe  . Smokeless tobacco: Never Used     Comment: Smoked  cigarettes early age socially  . Alcohol Use: No    Review of Systems  All other systems reviewed and are negative.     Allergies  Advair diskus; Dilaudid; and Morphine and related  Home Medications   Prior to Admission medications   Medication Sig Start Date End Date Taking? Authorizing Provider  Alfuzosin HCl (UROXATRAL PO) Take 10 mg by mouth 2 (two) times daily.    Yes Historical Provider, MD  atenolol (TENORMIN) 50 MG tablet Take 0.5 tablets (25 mg total) by mouth daily. 09/25/10  Yes Rosalio Macadamia, NP  atorvastatin (LIPITOR) 10 MG tablet Take 10 mg by mouth daily.     Yes Historical Provider, MD  digoxin (LANOXIN) 0.125 MG tablet Take 1 tablet (125 mcg total) by mouth daily. 09/27/10 03/29/12  Roger Shelter, MD  triamterene-hydrochlorothiazide (MAXZIDE-25) 37.5-25 MG per tablet Take 1 tablet by mouth daily.     Historical Provider, MD  warfarin (COUMADIN) 5 MG tablet Take 5-7.5 mg by mouth daily at 6 PM. Take 1 tablet in Sun, tues, Fri. Take 1.5 tablet on Wed, thrus, Sat, Mon    Historical Provider, MD   BP 109/76 mmHg  Pulse 95  Resp 23  SpO2 97% Physical Exam  Constitutional: He is oriented to person, place, and time. He appears well-developed and well-nourished.  HENT:  Head: Normocephalic and atraumatic.  Eyes: EOM are normal.  Neck: Normal range of motion.  C-spine nontender.  C-spine cleared by Nexus criteria.  Cardiovascular: Normal rate, regular rhythm, normal heart sounds and intact distal pulses.   Pulmonary/Chest: Effort normal and breath sounds normal. No respiratory distress.  Abdominal: Soft. He exhibits no distension. There is no tenderness.  Musculoskeletal: Normal range of motion.  Mild pain with range of motion of left hip.  No obvious deformity.  Normal pulses in left foot.  Full range of motion of right hip.  Neurological: He is alert and oriented to person, place, and time.  Skin: Skin is warm and dry.  Psychiatric: He has a normal mood and  affect. Judgment normal.  Nursing note and vitals reviewed.   ED Course  Procedures (including critical care time) Labs Review Labs Reviewed  BASIC METABOLIC PANEL - Abnormal; Notable for the following:    Glucose, Bld 102 (*)    BUN 31 (*)    GFR calc non Af Amer 56 (*)    GFR calc Af Amer 65 (*)    All other components within normal limits  CBC WITH DIFFERENTIAL - Abnormal; Notable for the following:    WBC 12.6 (*)    RBC 3.96 (*)    Hemoglobin 11.9 (*)    HCT 35.8 (*)    Neutro Abs 9.4 (*)    All other components within normal limits  PROTIME-INR - Abnormal; Notable for the following:    Prothrombin Time 35.1 (*)    INR 3.46 (*)    All other components within normal limits  TYPE AND SCREEN    Imaging Review Dg Chest 1 View  05/18/2014   CLINICAL DATA:  Fall, left hip pain  EXAM: CHEST - 1 VIEW  COMPARISON:  08/14/2011  FINDINGS: Cardiomediastinal silhouette is stable. No acute infiltrate or pulmonary edema. Again noted hyperinflation and chronic mild interstitial prominence. Stable calcified granuloma left midlung.  IMPRESSION: No active disease. Stable hyperinflation and chronic mild interstitial prominence. Stable calcified granuloma left midlung.   Electronically Signed   By: Natasha Mead M.D.   On: 05/18/2014 15:58   Dg Hip Unilat With Pelvis 2-3 Views Left  05/18/2014   CLINICAL DATA:  Left hip pain post fall  EXAM: DG HIP W/ PELVIS 2-3V*L*  COMPARISON:  03/31/2012  FINDINGS: Three views of the left hip submitted. Again noted right hip prosthesis in anatomic alignment. There is a left hip prosthesis in anatomic alignment. No evidence of prosthesis loosening. No acute fracture or subluxation.  IMPRESSION: No acute fracture or subluxation. Bilateral hip prosthesis. No evidence of loosening of left hip prosthesis.   Electronically Signed   By: Natasha Mead M.D.   On: 05/18/2014 16:02   Dg Femur Min 2 Views Left  05/18/2014   CLINICAL DATA:  Fall, left hip pain  EXAM: DG FEMUR  2+V*L*  COMPARISON:  Left hip same day.  FINDINGS: Two views of mid and distal femur submitted. No acute fracture or subluxation. Partially visualized intramedullary component of left hip prosthesis without evidence of loosening.  IMPRESSION: No acute fracture or subluxation.   Electronically Signed   By: Natasha Mead M.D.   On: 05/18/2014 16:00  I personally reviewed the imaging tests through PACS system I reviewed available ER/hospitalization records through the EMR    EKG Interpretation None  MDM   Final diagnoses:  Fall  Pain    Despite plain films are without fracture the patient continues to have pain with ambulation this time.  He is unable to ambulate.  Patient will undergo MRI of the left hip to evaluate for occult fracture. Care to Dr Juleen ChinaKohut    Lyanne CoKevin M Hayzlee Mcsorley, MD 05/18/14 [phone]

## 2014-05-18 NOTE — ED Notes (Signed)
MD Campos at bedside.  

## 2014-05-18 NOTE — ED Notes (Signed)
Attempted report x1. 

## 2014-05-18 NOTE — ED Notes (Signed)
Dr. Kohut at bedside 

## 2014-05-18 NOTE — ED Notes (Signed)
MD Gardner at bedside. 

## 2014-05-19 ENCOUNTER — Inpatient Hospital Stay (HOSPITAL_COMMUNITY): Payer: Medicare Other

## 2014-05-19 LAB — PROTIME-INR
INR: 2.98 — AB (ref 0.00–1.49)
PROTHROMBIN TIME: 31.2 s — AB (ref 11.6–15.2)

## 2014-05-19 LAB — SURGICAL PCR SCREEN
MRSA, PCR: NEGATIVE
STAPHYLOCOCCUS AUREUS: NEGATIVE

## 2014-05-19 MED ORDER — MUPIROCIN 2 % EX OINT
1.0000 "application " | TOPICAL_OINTMENT | Freq: Two times a day (BID) | CUTANEOUS | Status: DC
  Administered 2014-05-19 – 2014-05-21 (×4): 1 via NASAL
  Filled 2014-05-19 (×3): qty 22

## 2014-05-19 MED ORDER — CHLORHEXIDINE GLUCONATE CLOTH 2 % EX PADS: 6.0000 | MEDICATED_PAD | Freq: Every day | CUTANEOUS | Status: DC

## 2014-05-19 MED ORDER — WARFARIN - PHARMACIST DOSING INPATIENT
Freq: Every day | Status: DC
  Administered 2014-05-20: 18:00:00

## 2014-05-19 MED ORDER — ENSURE COMPLETE PO LIQD
237.0000 mL | Freq: Two times a day (BID) | ORAL | Status: DC
  Administered 2014-05-19 – 2014-05-21 (×5): 237 mL via ORAL

## 2014-05-19 MED ORDER — CHLORHEXIDINE GLUCONATE CLOTH 2 % EX PADS
6.0000 | MEDICATED_PAD | Freq: Every day | CUTANEOUS | Status: DC
  Administered 2014-05-19 – 2014-05-20 (×2): 6 via TOPICAL

## 2014-05-19 MED ORDER — WARFARIN SODIUM 2.5 MG PO TABS
2.5000 mg | ORAL_TABLET | Freq: Once | ORAL | Status: AC
  Administered 2014-05-19: 2.5 mg via ORAL
  Filled 2014-05-19: qty 1

## 2014-05-19 MED ORDER — MUPIROCIN 2 % EX OINT: 1.0000 "application " | TOPICAL_OINTMENT | Freq: Two times a day (BID) | CUTANEOUS | Status: DC

## 2014-05-19 NOTE — Consult Note (Signed)
ORTHOPAEDIC CONSULTATION  REQUESTING PHYSICIAN: Leroy SeaPrashant K Singh, MD  Chief Complaint: Left hip pain  HPI: Kyle [name] is a 81 y.o. male who complains of  left hip pain after mechanical fall yesterday. He is at multiple recurrent falls in the recent past. He was on his deck trying to move a generator in preparation for the bad weather and slipped. He has had a left hip replacement done in 1998. Pain is rated as moderate, worse with movement, better with rest. He is very concerned because his wife has advanced dementia and he does not want her to be home alone. He denies any other injuries, although the left wrist has started hurting him as well.  Past Medical History  Diagnosis Date  . PAF (paroxysmal atrial fibrillation)     Managed with rate control and coumadin  . COPD (chronic obstructive pulmonary disease)   . HTN (hypertension)   . Hyperlipemia   . OA (osteoarthritis)   . Chronic anticoagulation   . Incomplete rotator cuff tear or rupture of right shoulder, not specified as traumatic 2012  . Chronic anticoagulation   . Nocturia   . Osteoarthritis of right hip 03/31/2012  . Shortness of breath    Past Surgical History  Procedure Laterality Date  . Total hip arthroplasty  1998  . Shoulder arthroscopy  1994    RIGHT  . Cholecystectomy    . Appendectomy    . Cataract extraction w/ intraocular lens  implant, bilateral    . Right knee arthroscopy    . Total hip arthroplasty  03/31/2012    RIGHT HIP  . Total hip arthroplasty  03/31/2012    Procedure: TOTAL HIP ARTHROPLASTY;  Surgeon: Eulas PostJoshua P Aurelio Mccamy, MD;  Location: [facility] OR;  Service: Orthopedics;  Laterality: Right;   History   Social History  . Marital Status: Married    Spouse Name: N/A    Number of Children: N/A  . Years of Education: N/A   Occupational History  . retired    Social History Main Topics  . Smoking status: Current Some Day Smoker -- 44 years    Types: Pipe  . Smokeless tobacco: Never Used     Comment:  Smoked cigarettes early age socially  . Alcohol Use: No  . Drug Use: No  . Sexual Activity: No   Other Topics Concern  . None   Social History Narrative   Family History  Problem Relation Age of Onset  . Cancer Father   . Cancer Mother   . Other Brother     SUICIDE   Allergies  Allergen Reactions  . Advair Diskus [Fluticasone-Salmeterol] Other (See Comments)  . Dilaudid [Hydromorphone Hcl] Other (See Comments)    Makes patient very loopy and disoriented  . Morphine And Related Other (See Comments)   Prior to Admission medications   Medication Sig Start Date End Date Taking? Authorizing Provider  Alfuzosin HCl (UROXATRAL PO) Take 10 mg by mouth 2 (two) times daily.    Yes Historical Provider, MD  atenolol (TENORMIN) 50 MG tablet Take 0.5 tablets (25 mg total) by mouth daily. 09/25/10  Yes Rosalio MacadamiaLori C Gerhardt, NP  atorvastatin (LIPITOR) 10 MG tablet Take 10 mg by mouth daily.     Yes Historical Provider, MD  digoxin (LANOXIN) 0.125 MG tablet Take 1 tablet (125 mcg total) by mouth daily. 09/27/10 05/18/14 Yes Roger ShelterStanley [name], MD  warfarin (COUMADIN) 5 MG tablet Take 5-7.5 mg by mouth daily at 6 PM. Takes 7.5mg  on tues, thurs and  sat Takes  all other days   Yes Historical Provider, MD   Dg Chest 1 View  05/18/2014   CLINICAL DATA:  Fall, left hip pain  EXAM: CHEST - 1 VIEW  COMPARISON:  08/14/2011  FINDINGS: Cardiomediastinal silhouette is stable. No acute infiltrate or pulmonary edema. Again noted hyperinflation and chronic mild interstitial prominence. Stable calcified granuloma left midlung.  IMPRESSION: No active disease. Stable hyperinflation and chronic mild interstitial prominence. Stable calcified granuloma left midlung.   Electronically Signed   By: Natasha Mead M.D.   On: 05/18/2014 15:58   Ct Hip Left Wo Contrast  05/18/2014   CLINICAL DATA:  PT. FELL, C/O HIP PAIN, HX BILATERAL HIP REPLACEMENTS, SIGNIFICANT ARTIFACT AS A RESULT,  EXAM: CT OF THE LEFT HIP WITHOUT CONTRAST   TECHNIQUE: Multidetector CT imaging of the left hip was performed according to the standard protocol. Multiplanar CT image reconstructions were also generated.  COMPARISON:  03/31/2012  FINDINGS: Acute nondisplaced fractures are noted of the proximal left femur.  A fracture extends across the posterior margin at the posterior lateral base of the greater trochanter, with the fracture continuing along the posterior shaft along the long axis of the femur. Another nondisplaced fracture line extends along the anterior cortex from just below the trochanteric region to the level of the inferior tip of the femoral component.  There is lucency surrounding the inferior aspect of the stem of the femoral component, which was present on the prior radiographs.  Acetabular components well seated and stable. There is no evidence of an acetabular fracture.  Soft tissue edema is seen lateral to the proximal femur.  IMPRESSION: 1. Nondisplaced fractures of the proximal left femur extending from the posterior lateral base of the greater trochanter and across the anterior and posterior cortices to the midshaft near the level of the inferior tip of the stem of the femoral component.   Electronically Signed   By: Amie Portland M.D.   On: 05/18/2014 19:38   Dg Hip Unilat With Pelvis 2-3 Views Left  05/18/2014   CLINICAL DATA:  Left hip pain post fall  EXAM: DG HIP W/ PELVIS 2-3V*L*  COMPARISON:  03/31/2012  FINDINGS: Three views of the left hip submitted. Again noted right hip prosthesis in anatomic alignment. There is a left hip prosthesis in anatomic alignment. No evidence of prosthesis loosening. No acute fracture or subluxation.  IMPRESSION: No acute fracture or subluxation. Bilateral hip prosthesis. No evidence of loosening of left hip prosthesis.   Electronically Signed   By: Natasha Mead M.D.   On: 05/18/2014 16:02   Dg Femur Min 2 Views Left  05/18/2014   CLINICAL DATA:  Fall, left hip pain  EXAM: DG FEMUR 2+V*L*  COMPARISON:   Left hip same day.  FINDINGS: Two views of mid and distal femur submitted. No acute fracture or subluxation. Partially visualized intramedullary component of left hip prosthesis without evidence of loosening.  IMPRESSION: No acute fracture or subluxation.   Electronically Signed   By: Natasha Mead M.D.   On: 05/18/2014 16:00    Positive ROS: All other systems have been reviewed and were otherwise negative with the exception of those mentioned in the HPI and as above.  Physical Exam: General: Alert, no acute distress Cardiovascular: No pedal edema Respiratory: No cyanosis, no use of accessory musculature GI: No organomegaly, abdomen is soft and non-tender Skin: No lesions in the area of chief complaint, his previous surgical wound has healed by report. Neurologic: Sensation intact  distally Psychiatric: Patient is competent for consent with normal mood and affect Lymphatic: No axillary or cervical lymphadenopathy  MUSCULOSKELETAL: Left leg has intact EHL and FHL. Mildly positive log roll. I did not test his ability to do a straight leg raise. I did not test range of motion of the hip. His left wrist has mild redness with some soft tissue swelling, minimal pain to palpation, dorsiflexion to 20 and palmar flexion to 30.  Assessment: Nondisplaced left periprosthetic femur fracture, left wrist contusion, question more significant injury, status post fall with multiple coexisting risk factors as indicated above.  Plan: This is an acute severe injury, is prominent take at least 6-8 weeks to recover from. Based on the minimal displacement, the stem appears to be stable, and I would not recommend surgery at the current time. I will order physical therapy, touch toe weightbearing left lower extremity, and also get an x-ray of the left wrist to make sure that no bony injury. Clinically I think he is okay, but we will just get an x-ray to be sure. Assuming x-rays negative and he can be weightbearing as  tolerated on the left upper extremity.  The discharge planning may be somewhat challenging because of his wife's severe dependency on him, and he is very much can want to go home, and will need significant arrangements in order to provide for a safe home environment given his new injury.  I will plan to follow along with you, and see him in my office in the next 1-2 weeks.    Eulas Post, MD Cell [phone]   05/19/2014 6:17 AM

## 2014-05-19 NOTE — Clinical Social Work Placement (Cosign Needed)
Clinical Social Work Department CLINICAL SOCIAL WORK PLACEMENT NOTE 05/19/2014  Patient:  Aletta EdouardMOSER,Olon  Account Number:  [id number] Admit date:  05/18/2014  Clinical Social Worker:  Hortencia PilarKIERRA Damere Brandenburg, CLINICAL SOCIAL WORKER  Date/time:  05/19/2014 03:16 PM  Clinical Social Work is seeking post-discharge placement for this patient at the following level of care:   SKILLED NURSING   (*CSW will update this form in Epic as items are completed)   05/19/2014  [facility]/family provided with [facility] of Clinical Social Work's list of facilities offering this level of care within the geographic area requested by the patient (or if unable, by the patient's family).  05/19/2014  Patient/family informed of their freedom to choose among providers that offer the needed level of care, that participate in Medicare, Medicaid or managed care program needed by the patient, have an available bed and are willing to accept the patient.  05/19/2014  Patient/family informed of [facility]' ownership interest in [facility], as well as of the fact that they are under no obligation to receive care at this facility.  PASARR submitted to EDS on 05/19/2014 PASARR number received on 05/19/2014  FL2 transmitted to all facilities in geographic area requested by pt/family on  05/19/2014 FL2 transmitted to all facilities within larger geographic area on   Patient informed that his/her managed care company has contracts with or will negotiate with  certain facilities, including the following:     Patient/family informed of bed offers received:   Patient chooses bed at  Physician recommends and patient chooses bed at    Patient to be transferred to  on   Patient to be transferred to facility by  Patient and family notified of transfer on  Name of family member notified:    The following physician request were entered in Epic:   Additional Comments:   Alycia Cooperwood S. Kammy Klett, BSW-Intern

## 2014-05-19 NOTE — Evaluation (Signed)
Physical Therapy Evaluation Patient Details Name: Kyle [name] MRN: [id number] DOB: 1926-07-07 Today's Date: 05/19/2014   History of Present Illness  Adm 05/18/14 s/p fall with Lt femur peri-prosthetic fx. Ortho managing without surgery. Lt wrist xray inconclusive re: ? acute fx. PMHx- pt reports RLE 1" shorter (he has no built up shoe); bil THA; Rt rotator cuff tear; COPD; afib  Clinical Impression  Pt admitted with above diagnosis. Pt currently with functional limitations due to the deficits listed below (see PT Problem List). Pt able to acknowledge that he cannot manage at home in current condition and that it will take him some time to get strong enough. Pt will benefit from skilled PT to increase their independence and safety with mobility to allow discharge to the venue listed below.       Follow Up Recommendations SNF;Supervision/Assistance - 24 hour    Equipment Recommendations   (Lt platform RW)    Recommendations for Other Services       Precautions / Restrictions Precautions Precautions: Posterior Hip;Fall Restrictions Weight Bearing Restrictions: Yes LUE Weight Bearing: Weight bear through elbow only (MD has not yet reviewed inconclusive wrist xray) LLE Weight Bearing: Touchdown weight bearing      Mobility  Bed Mobility Overal bed mobility: Needs Assistance Bed Mobility: Supine to Sit     Supine to sit: Mod assist;HOB elevated     General bed mobility comments: HOB elevated and encouraged pt not to use Lt wrist (possible acute fx); assist to raise torso and support LLE  Transfers Overall transfer level: Needs assistance Equipment used: Left platform walker Transfers: Sit to/from Stand Sit to Stand: Mod assist;+2 physical assistance;+2 safety/equipment         General transfer comment: Lt forearm on PFRW with bed elevated 6" to simulate home environment; limiited by pain and only using Rt extremities to come to stand; assist to control descent    Ambulation/Gait Ambulation/Gait assistance: Min assist;+2 safety/equipment Ambulation Distance (Feet): 6 Feet Assistive device: Left platform walker Gait Pattern/deviations: Step-to pattern;Trunk flexed     General Gait Details: very small steps/hops with RLE initially with gradual incr in step length; steady assist, especially with sharp pains; pt able to maintain TDWB  Stairs            Wheelchair Mobility    Modified Rankin (Stroke Patients Only)       Balance Overall balance assessment: Needs assistance;History of Falls Sitting-balance support: Single extremity supported;Feet supported Sitting balance-Leahy Scale: Poor     Standing balance support: Bilateral upper extremity supported Standing balance-Leahy Scale: Poor                               Pertinent Vitals/Pain Pain Assessment: 0-10 Pain Score: 5  Pain Location: Lt hip; denies pain in wrist Pain Intervention(s): Limited activity within patient's tolerance;Monitored during session;Premedicated before session;Repositioned    Home Living Family/patient expects to be discharged to:: Private residence Living Arrangements: Spouse/significant other (cares for her (dementia))   Type of Home: House Home Access: Stairs to enter Entrance Stairs-Rails: Doctor, general practice of Steps: 3+1 Home Layout: Two level;Able to live on main level with bedroom/bathroom Home Equipment: Cane - single point;Grab bars - tub/shower;Bedside commode;Walker - 2 wheels (suction grab bar in shower; BSC over toilet ) Additional Comments: walkin shower    Prior Function Level of Independence: Independent with assistive device(s)         Comments: no device to walk;  grab bars, BSC over toilet     Hand Dominance        Extremity/Trunk Assessment   Upper Extremity Assessment: Defer to OT evaluation (able to tolerate WB thru elbow)           Lower Extremity Assessment: LLE  deficits/detail;Generalized weakness   LLE Deficits / Details: AROM Lt hip to 60 supine; PROM to 90; required assist to support LLE off EOB due to pain  Cervical / Trunk Assessment: Kyphotic  Communication   Communication: HOH  Cognition Arousal/Alertness: Awake/alert Behavior During Therapy: WFL for tasks assessed/performed Overall Cognitive Status: Within Functional Limits for tasks assessed                      General Comments General comments (skin integrity, edema, etc.): son arrived mid-session    Exercises General Exercises - Lower Extremity Ankle Circles/Pumps: AROM;Both;10 reps Heel Slides: AROM;Left;Other reps (comment)      Assessment/Plan    PT Assessment Patient needs continued PT services  PT Diagnosis Difficulty walking;Acute pain   PT Problem List Decreased strength;Decreased activity tolerance;Decreased balance;Decreased mobility;Decreased knowledge of use of DME;Decreased safety awareness;Decreased knowledge of precautions;Pain  PT Treatment Interventions DME instruction;Gait training;Functional mobility training;Therapeutic activities;Therapeutic exercise;Balance training;Patient/family education   PT Goals (Current goals can be found in the Care Plan section) Acute Rehab PT Goals Patient Stated Goal: get back to moving himself PT Goal Formulation: With patient Time For Goal Achievement: 06/02/14 Potential to Achieve Goals: Good    Frequency Min 3X/week   Barriers to discharge Inaccessible home environment;Decreased caregiver support pt typically cares for his wife with advanced dementia; 4 steps to enter home    Co-evaluation PT/OT/SLP Co-Evaluation/Treatment: Yes Reason for Co-Treatment: Complexity of the patient's impairments (multi-system involvement);For patient/therapist safety PT goals addressed during session: Mobility/safety with mobility;Proper use of DME;Balance         End of Session Equipment Utilized During Treatment: Gait  belt Activity Tolerance: Patient limited by pain Patient left: in chair;with call bell/phone within reach;with chair alarm set;with family/visitor present Nurse Communication: Mobility status;Weight bearing status (assuming NWB Lt wrist until MD reads xray)         Time: [id number] PT Time Calculation (min) (ACUTE ONLY): 29 min   Charges:   PT Evaluation $Initial PT Evaluation Tier I: 1 Procedure     PT G Codes:        Kyle [name] 05/19/2014, 12:33 PM  Pager [phone]

## 2014-05-19 NOTE — Progress Notes (Signed)
Patient Demographics  Kyle [name], is a 81 y.o. male, DOB - 12/08/1926, YQM:[id number]  Admit date - 05/18/2014   Admitting Physician Hillary BowJared M Gardner, DO  Outpatient Primary MD for the patient is Ezequiel KayserPERINI,MARK A, MD  LOS - 1   Chief Complaint  Patient presents with  . Hip Pain  . Fall        Subjective:   Kyle [name] today has, No headache, No chest pain, No abdominal pain - No Nausea, No new weakness tingling or numbness, No Cough - SOB.  Has some pain in the left wrist and left hip.  Assessment & Plan    1. Mechanical fall with left hip fracture and left wrist injury. Seen by orthopedics. For now conservative management, PT to evaluate, may require placement. Supportive care continued. Left wrist x-ray inconclusive will obtain MRI.   2. Paroxysmal atrial fibrillation. On Coumadin. Pharmacy monitoring. Currently INR supratherapeutic. Continue beta blocker and digoxin for rate control.   3. Essential hypertension. Blood pressure stable on beta blocker. Continue the same dose.    4.BPH - continue Alfuzosin.    5. Dyslipidemia. On statin.    Code Status: Full  Family Communication: None present  Disposition Plan: To be decided   Procedures   CT left hip. MRI left wrist   Consults  Ortho   Medications  Scheduled Meds: . alfuzosin  10 mg Oral BID  . atenolol  25 mg Oral Daily  . atorvastatin  10 mg Oral Daily  . Chlorhexidine Gluconate Cloth  6 each Topical Daily  . digoxin  125 mcg Oral Daily  . mupirocin ointment  1 application Nasal BID   Continuous Infusions:  PRN Meds:.HYDROcodone-acetaminophen, methocarbamol (ROBAXIN)  IV  DVT Prophylaxis  Coumadin  Lab Results  Component Value Date   INR 3.46* 05/18/2014   INR 4.5 05/17/2014   INR 2.8 04/05/2014      Lab Results  Component Value Date   PLT 325 05/18/2014    Antibiotics     Anti-infectives    None          Objective:   Filed Vitals:   05/18/14 2130 05/18/14 2237 05/19/14 0542 05/19/14 1014  BP: 128/74 130/63 116/70 102/63  Pulse: 84 97 88 85  Temp:  98.1 F (36.7 C) 98.1 F (36.7 C) 98.4 F (36.9 C)  TempSrc:  Oral Oral Oral  Resp:  16 16 20   Height:  6\' 1"  (1.854 m)    Weight:  71.215 kg (157 lb)    SpO2: 100% 95% 93% 98%    Wt Readings from Last 3 Encounters:  05/18/14 71.215 kg (157 lb)  02/09/13 73.936 kg (163 lb)  08/10/12 74.39 kg (164 lb)     Intake/Output Summary (Last 24 hours) at 05/19/14 1034 Last data filed at 05/19/14 0958  Gross per 24 hour  Intake    100 ml  Output   1000 ml  Net   -900 ml     Physical Exam  Awake Alert, Oriented X 3, No new F.N deficits, Normal affect [place].AT,PERRAL Supple Neck,No JVD, No cervical lymphadenopathy appriciated.  Symmetrical Chest wall movement, Good air movement bilaterally, CTAB RRR,No Gallops,Rubs or new Murmurs, No Parasternal Heave +ve B.Sounds, Abd Soft, No tenderness,  No organomegaly appriciated, No rebound - guarding or rigidity. No Cyanosis, Clubbing or edema, No new Rash or bruise  Tender left hip and left wrist.   Data Review   Micro Results Recent Results (from the past 240 hour(s))  Surgical pcr screen     Status: None   Collection Time: 05/19/14  1:50 AM  Result Value Ref Range Status   MRSA, PCR NEGATIVE NEGATIVE Final   Staphylococcus aureus NEGATIVE NEGATIVE Final    Comment:        The Xpert SA Assay (FDA approved for NASAL specimens in patients over 68 years of age), is one component of a comprehensive surveillance program.  Test performance has been validated by [facility] for patients greater than or equal to 103 year old. It is not intended to diagnose infection nor to guide or monitor treatment.     Radiology Reports Dg Chest 1 View  05/18/2014    CLINICAL DATA:  Fall, left hip pain  EXAM: CHEST - 1 VIEW  COMPARISON:  08/14/2011  FINDINGS: Cardiomediastinal silhouette is stable. No acute infiltrate or pulmonary edema. Again noted hyperinflation and chronic mild interstitial prominence. Stable calcified granuloma left midlung.  IMPRESSION: No active disease. Stable hyperinflation and chronic mild interstitial prominence. Stable calcified granuloma left midlung.   Electronically Signed   By: Natasha Mead M.D.   On: 05/18/2014 15:58   Dg Wrist 2 Views Left  05/19/2014   CLINICAL DATA:  Arthritis. Pain and swelling. Redness. Noted injury. Initial evaluation.  EXAM: LEFT WRIST - 2 VIEW  COMPARISON:  None.  FINDINGS: Small bone fragments are noted the base of the first metatarsal. Small bone fragments are noted posterior to the wrist. These could represent acute or old fracture fragments. Radiocarpal, intercarpal, and carpometacarpal mild degenerative changes are present. Diffuse soft tissue swelling is present. No radiopaque foreign body. If symptoms remain MRI may prove useful for further evaluation.  IMPRESSION: 1. Small bone fragments are noted the base of first metatarsal and along the posterior wrist. These may be old fracture fragments. 2. Degenerative changes radiocarpal, intercarpal, and carpometacarpal joints. Degenerative changes are mild. No erosive arthropathy. 3. Diffuse soft tissue swelling is present. No radiopaque foreign body. If symptoms remain MRI of the left wrist can be obtained for further evaluation.   Electronically Signed   By: Maisie Fus  Register   On: 05/19/2014 09:27   Ct Hip Left Wo Contrast  05/18/2014   CLINICAL DATA:  PT. FELL, C/O HIP PAIN, HX BILATERAL HIP REPLACEMENTS, SIGNIFICANT ARTIFACT AS A RESULT,  EXAM: CT OF THE LEFT HIP WITHOUT CONTRAST  TECHNIQUE: Multidetector CT imaging of the left hip was performed according to the standard protocol. Multiplanar CT image reconstructions were also generated.  COMPARISON:  03/31/2012   FINDINGS: Acute nondisplaced fractures are noted of the proximal left femur.  A fracture extends across the posterior margin at the posterior lateral base of the greater trochanter, with the fracture continuing along the posterior shaft along the long axis of the femur. Another nondisplaced fracture line extends along the anterior cortex from just below the trochanteric region to the level of the inferior tip of the femoral component.  There is lucency surrounding the inferior aspect of the stem of the femoral component, which was present on the prior radiographs.  Acetabular components well seated and stable. There is no evidence of an acetabular fracture.  Soft tissue edema is seen lateral to the proximal femur.  IMPRESSION: 1. Nondisplaced fractures of the proximal left  femur extending from the posterior lateral base of the greater trochanter and across the anterior and posterior cortices to the midshaft near the level of the inferior tip of the stem of the femoral component.   Electronically Signed   By: Amie Portland M.D.   On: 05/18/2014 19:38   Dg Hip Unilat With Pelvis 2-3 Views Left  05/18/2014   CLINICAL DATA:  Left hip pain post fall  EXAM: DG HIP W/ PELVIS 2-3V*L*  COMPARISON:  03/31/2012  FINDINGS: Three views of the left hip submitted. Again noted right hip prosthesis in anatomic alignment. There is a left hip prosthesis in anatomic alignment. No evidence of prosthesis loosening. No acute fracture or subluxation.  IMPRESSION: No acute fracture or subluxation. Bilateral hip prosthesis. No evidence of loosening of left hip prosthesis.   Electronically Signed   By: Natasha Mead M.D.   On: 05/18/2014 16:02   Dg Femur Min 2 Views Left  05/18/2014   CLINICAL DATA:  Fall, left hip pain  EXAM: DG FEMUR 2+V*L*  COMPARISON:  Left hip same day.  FINDINGS: Two views of mid and distal femur submitted. No acute fracture or subluxation. Partially visualized intramedullary component of left hip prosthesis without  evidence of loosening.  IMPRESSION: No acute fracture or subluxation.   Electronically Signed   By: Natasha Mead M.D.   On: 05/18/2014 16:00     CBC  Recent Labs Lab 05/18/14 1500  WBC 12.6*  HGB 11.9*  HCT 35.8*  PLT 325  MCV 90.4  MCH 30.1  MCHC 33.2  RDW 15.0  LYMPHSABS 2.4  MONOABS 0.8  EOSABS 0.1  BASOSABS 0.0    Chemistries   Recent Labs Lab 05/18/14 1500  NA 139  K 4.2  CL 103  CO2 25  GLUCOSE 102*  BUN 31*  CREATININE 1.15  CALCIUM 9.2   ------------------------------------------------------------------------------------------------------------------ estimated creatinine clearance is 47.3 mL/min (by C-G formula based on Cr of 1.15). ------------------------------------------------------------------------------------------------------------------ No results for input(s): HGBA1C in the last 72 hours. ------------------------------------------------------------------------------------------------------------------ No results for input(s): CHOL, HDL, LDLCALC, TRIG, CHOLHDL, LDLDIRECT in the last 72 hours. ------------------------------------------------------------------------------------------------------------------ No results for input(s): TSH, T4TOTAL, T3FREE, THYROIDAB in the last 72 hours.  Invalid input(s): FREET3 ------------------------------------------------------------------------------------------------------------------ No results for input(s): VITAMINB12, FOLATE, FERRITIN, TIBC, IRON, RETICCTPCT in the last 72 hours.  Coagulation profile  Recent Labs Lab 05/17/14 0939 05/18/14 1500  INR 4.5 3.46*    No results for input(s): DDIMER in the last 72 hours.  Cardiac Enzymes No results for input(s): CKMB, TROPONINI, MYOGLOBIN in the last 168 hours.  Invalid input(s): CK ------------------------------------------------------------------------------------------------------------------ Invalid input(s): POCBNP     Time Spent in minutes   35   SINGH,PRASHANT K M.D on 05/19/2014 at 10:34 AM  Between 7am to 7pm - Pager - [phone]  After 7pm go to [email or web address] - password [employer]  [facility] Office  [phone]

## 2014-05-19 NOTE — Clinical Social Work Psychosocial (Cosign Needed)
Clinical Social Work Department BRIEF PSYCHOSOCIAL ASSESSMENT 05/19/2014  Patient:  Kyle [name], Kyle [name]     Account Number:  [id number]     Admit date:  05/18/2014  Clinical Social Worker:  Durward Fortes, CLINICAL SOCIAL WORKER  Date/Time:  05/19/2014 03:01 PM  Referred by:  Physician  Date Referred:  05/19/2014 Referred for  SNF Placement   Other Referral:   none.   Interview type:  Patient Other interview type:   none.    PSYCHOSOCIAL DATA Living Status:  WIFE Admitted from facility:   Level of care:   Primary support name:  Johnwesley Lederman Primary support relationship to patient:  SPOUSE Degree of support available:   Adequate Support.    CURRENT CONCERNS Current Concerns  Post-Acute Placement   Other Concerns:   none.    SOCIAL WORK ASSESSMENT / PLAN CSW and BSW-Intern consulted regarding possible SNF placement for pt once medically stable for discharge. BSW-intern met with pt and pt's wife Joycelyn Schmid) at bedside. BSW-intern informed pt and pt's wife of PT recommendation for pt to receive further therapy at a SNF. Pt agreed to further therapy at [facility], if [facility] was no longer an option for pt.    Pt expressed no other concerns at this time. CSW and BSW-Intern to continue to assist with discharge planning needs.   Assessment/plan status:  Psychosocial Support/Ongoing Assessment of Needs Other assessment/ plan:   none.   Information/referral to community resources:   Pt to be discharged with Home Health or to [facility] once medically stable for discharge.    PATIENT'S/FAMILY'S RESPONSE TO PLAN OF CARE: Pt and pt's wife understanding and agreeable to CSW plan of care. Pt expressed no further questions or concerns at this time.       Virgie Dad Romond Pipkins, BSW-Intern

## 2014-05-19 NOTE — Evaluation (Signed)
Occupational Therapy Evaluation Patient Details Name: Kyle EdouardMyrl [name] MRN: [id number] DOB: 06/07/1926 Today's Date: 05/19/2014    History of Present Illness Adm 05/18/14 s/p fall with Lt femur peri-prosthetic fx. Ortho managing without surgery. Lt wrist xray inconclusive re: ? acute fx. PMHx- pt reports RLE 1" shorter (he has no built up shoe); bil THA; Rt rotator cuff tear; COPD; afib   Clinical Impression   Pt admitted with above. He demonstrates the below listed deficits and will benefit from continued OT to maximize safety and independence with BADLs.  Pt present to OT with generalized weakness, increased pain, impaired balance.  He currently, requires +2 assist for functional mobility and max - total A for LB ADLs.  He does not have 24 hour assist available at discharge and will require SNF level rehab at discharge.       Follow Up Recommendations  SNF;Supervision/Assistance - 24 hour    Equipment Recommendations  None recommended by OT    Recommendations for Other Services       Precautions / Restrictions Precautions Precautions: Posterior Hip;Fall Restrictions Weight Bearing Restrictions: Yes LUE Weight Bearing: Weight bear through elbow only LLE Weight Bearing: Touchdown weight bearing      Mobility Bed Mobility Overal bed mobility: Needs Assistance Bed Mobility: Supine to Sit     Supine to sit: Mod assist;HOB elevated     General bed mobility comments: HOB elevated and encouraged pt not to use Lt wrist (possible acute fx); assist to raise torso and support LLE  Transfers Overall transfer level: Needs assistance Equipment used: Left platform walker Transfers: Sit to/from Stand;Stand Pivot Transfers Sit to Stand: Mod assist;+2 physical assistance;+2 safety/equipment Stand pivot transfers: Min assist;+2 physical assistance       General transfer comment: Lt forearm on PFRW with bed elevated 6" to simulate home environment; limiited by pain and only using Rt  extremities to come to stand; assist to control descent     Balance Overall balance assessment: Needs assistance Sitting-balance support: Single extremity supported Sitting balance-Leahy Scale: Poor     Standing balance support: Bilateral upper extremity supported Standing balance-Leahy Scale: Poor                              ADL Overall ADL's : Needs assistance/impaired Eating/Feeding: Modified independent;Sitting;Bed level   Grooming: Wash/dry hands;Wash/dry face;Oral care;Brushing hair;Set up;Sitting   Upper Body Bathing: Set up;Sitting   Lower Body Bathing: Maximal assistance;Sit to/from stand   Upper Body Dressing : Set up;Sitting   Lower Body Dressing: Total assistance;Sit to/from stand   Toilet Transfer: Moderate assistance;+2 for physical assistance;Ambulation;BSC;RW   Toileting- Clothing Manipulation and Hygiene: Total assistance;Sit to/from stand       Functional mobility during ADLs: Moderate assistance;+2 for physical assistance General ADL Comments: Pt limited with ability to perform LB ADLs due to pain      Vision                     Perception     Praxis      Pertinent Vitals/Pain Pain Assessment: 0-10 Pain Score: 5  Pain Location: Lt hip;  Denies Pain in Lt. wrist Pain Descriptors / Indicators: Aching Pain Intervention(s): Limited activity within patient's tolerance;Monitored during session;Premedicated before session;Repositioned     Hand Dominance     Extremity/Trunk Assessment Upper Extremity Assessment Upper Extremity Assessment: Generalized weakness;RUE deficits/detail;LUE deficits/detail RUE Deficits / Details: Pt with weakness Rt shoulder - reports h/o  RCT from previous fall  LUE Deficits / Details: Pt with edema at wrist and significant bruising thenar eminance and volar wrist    Lower Extremity Assessment Lower Extremity Assessment: LLE deficits/detail;Generalized weakness LLE Deficits / Details: AROM Lt hip to  60 supine; PROM to 90; required assist to support LLE off EOB due to pain LLE: Unable to fully assess due to pain   Cervical / Trunk Assessment Cervical / Trunk Assessment: Kyphotic   Communication Communication Communication: HOH   Cognition Arousal/Alertness: Awake/alert Behavior During Therapy: WFL for tasks assessed/performed Overall Cognitive Status: Within Functional Limits for tasks assessed                     General Comments       Exercises Exercises: General Lower Extremity     Shoulder Instructions      Home Living Family/patient expects to be discharged to:: Private residence Living Arrangements: Spouse/significant other (cares for her (dementia))   Type of Home: House Home Access: Stairs to enter [employer] of Steps: 3+1 Entrance Stairs-Rails: Right;Left Home Layout: Two level;Able to live on main level with bedroom/bathroom     Bathroom Shower/Tub: Producer, television/film/video: Standard     Home Equipment: Cane - single point;Grab bars - tub/shower;Bedside commode;Walker - 2 wheels (suction grab bar in shower; BSC over toilet )   Additional Comments: walkin shower      Prior Functioning/Environment Level of Independence: Independent with assistive device(s)        Comments: no device to walk; grab bars, BSC over toilet    OT Diagnosis: Generalized weakness;Acute pain   OT Problem List: Decreased strength;Decreased activity tolerance;Impaired balance (sitting and/or standing);Decreased safety awareness;Decreased knowledge of use of DME or AE;Decreased knowledge of precautions;Pain   OT Treatment/Interventions: Self-care/ADL training;DME and/or AE instruction;Therapeutic activities;Patient/family education;Balance training    OT Goals(Current goals can be found in the care plan section) Acute Rehab OT Goals Patient Stated Goal: to get better  OT Goal Formulation: With patient Time For Goal Achievement:  06/02/14 Potential to Achieve Goals: Good ADL Goals Pt Will Perform Grooming: with min assist;standing Pt Will Perform Lower Body Bathing: with min assist;with adaptive equipment;sit to/from stand Pt Will Perform Lower Body Dressing: with min assist;with adaptive equipment;sit to/from stand Pt Will Transfer to Toilet: ambulating;regular height toilet;bedside commode;grab bars;with min assist Pt Will Perform Toileting - Clothing Manipulation and hygiene: with min assist;sit to/from stand  OT Frequency: Min 2X/week   Barriers to D/C: Decreased caregiver support          Co-evaluation PT/OT/SLP Co-Evaluation/Treatment: Yes Reason for Co-Treatment: Complexity of the patient's impairments (multi-system involvement) PT goals addressed during session: Mobility/safety with mobility;Proper use of DME;Balance OT goals addressed during session: ADL's and self-care      End of Session Equipment Utilized During Treatment: Rolling walker (with platform ) Nurse Communication: Mobility status  Activity Tolerance:   Patient left: in chair;with call bell/phone within reach;with chair alarm set;with family/visitor present   Time: 1610-9604 OT Time Calculation (min): 24 min Charges:  OT General Charges $OT Visit: 1 Procedure OT Evaluation $Initial OT Evaluation Tier I: 1 Procedure G-Codes:    Jeani Hawking M 2014/06/18, 1:43 PM

## 2014-05-19 NOTE — Progress Notes (Signed)
INITIAL NUTRITION ASSESSMENT  DOCUMENTATION CODES Per approved criteria  -Not Applicable   INTERVENTION: - Ensure Complete po BID, each supplement provides 350 kcal and 13 grams of protein - RD will continue to monitor for nutrition care plan.  NUTRITION DIAGNOSIS: Inadequate oral intake related to hip pain as evidenced by reported poor po.   Goal: Pt to meet >/= 90% of their estimated nutrition needs   Monitor:  Weight trend, po intake, acceptance of supplements, labs  Reason for Assessment: Consult per Hip Fracture Protocol  81 y.o. male  Admitting Dx: Closed left hip fracture  ASSESSMENT: 81 y.o. male who complains of left hip pain after mechanical fall yesterday. He is at multiple recurrent falls in the recent past. He was on his deck trying to move a generator in preparation for the bad weather and slipped.   - Pt without hip fracture. Conservative management at this time per MD note.  - Pt reports that he did not eat much of his breakfast because he did not get what her ordered. He ate graham crackers with peanut butter instead. Pt reports that his appetite has been good. Pt with minor wt loss of ~5 lbs over the past several months. - No signs of fat or muscle wasting.   Labs: BUN elevated Na and K WNL  Height: Ht Readings from Last 1 Encounters:  05/18/14 6\' 1"  (1.854 m)    Weight: Wt Readings from Last 1 Encounters:  05/18/14 157 lb (71.215 kg)    Ideal Body Weight: 79.9 kg  % Ideal Body Weight: 89%  Wt Readings from Last 10 Encounters:  05/18/14 157 lb (71.215 kg)  02/09/13 163 lb (73.936 kg)  08/10/12 164 lb (74.39 kg)  03/31/12 164 lb 14.5 oz (74.8 kg)  02/28/12 164 lb 6.4 oz (74.571 kg)  08/14/11 170 lb 3.2 oz (77.202 kg)  08/07/11 170 lb (77.111 kg)  09/27/10 179 lb (81.194 kg)  09/25/10 184 lb 2 oz (83.519 kg)  09/17/10 178 lb 9.6 oz (81.012 kg)    BMI:  Body mass index is 20.72 kg/(m^2).  Estimated Nutritional Needs: Kcal:  1800-2000 Protein: 90-100 g Fluid: 1.8-2.0 L/day  Skin: Intact  Diet Order: Diet regular  EDUCATION NEEDS: -Education needs addressed   Intake/Output Summary (Last 24 hours) at 05/19/14 1256 Last data filed at 05/19/14 0958  Gross per 24 hour  Intake    100 ml  Output   1000 ml  Net   -900 ml    Last BM: prior to admission   Labs:   Recent Labs Lab 05/18/14 1500  NA 139  K 4.2  CL 103  CO2 25  BUN 31*  CREATININE 1.15  CALCIUM 9.2  GLUCOSE 102*    CBG (last 3)  No results for input(s): GLUCAP in the last 72 hours.  Scheduled Meds: . alfuzosin  10 mg Oral BID  . atenolol  25 mg Oral Daily  . atorvastatin  10 mg Oral Daily  . Chlorhexidine Gluconate Cloth  6 each Topical Daily  . digoxin  125 mcg Oral Daily  . mupirocin ointment  1 application Nasal BID    Continuous Infusions:   Past Medical History  Diagnosis Date  . PAF (paroxysmal atrial fibrillation)     Managed with rate control and coumadin  . COPD (chronic obstructive pulmonary disease)   . HTN (hypertension)   . Hyperlipemia   . OA (osteoarthritis)   . Chronic anticoagulation   . Incomplete rotator cuff tear or  rupture of right shoulder, not specified as traumatic 2012  . Chronic anticoagulation   . Nocturia   . Osteoarthritis of right hip 03/31/2012  . Shortness of breath     Past Surgical History  Procedure Laterality Date  . Total hip arthroplasty  1998  . Shoulder arthroscopy  1994    RIGHT  . Cholecystectomy    . Appendectomy    . Cataract extraction w/ intraocular lens  implant, bilateral    . Right knee arthroscopy    . Total hip arthroplasty  03/31/2012    RIGHT HIP  . Total hip arthroplasty  03/31/2012    Procedure: TOTAL HIP ARTHROPLASTY;  Surgeon: Eulas Post, MD;  Location: [facility] OR;  Service: Orthopedics;  Laterality: Right;    Emmaline Kluver MS, RD, LDN

## 2014-05-19 NOTE — Progress Notes (Signed)
Pt had urine frequency during shift voiding around 100ml each time. Pt PVR for 181ml. Reported off to incoming RN to continue to monitor. Arabella MerlesP. Amo Fiore Detjen RN.

## 2014-05-19 NOTE — Progress Notes (Addendum)
ANTICOAGULATION CONSULT NOTE - Initial Consult  Pharmacy Consult for warfarin Indication: atrial fibrillation  Allergies  Allergen Reactions  . Advair Diskus [Fluticasone-Salmeterol] Other (See Comments)  . Dilaudid [Hydromorphone Hcl] Other (See Comments)    Makes patient very loopy and disoriented  . Morphine And Related Other (See Comments)    Patient Measurements: Height: 6\' 1"  (185.4 cm) Weight: 157 lb (71.215 kg) IBW/kg (Calculated) : 79.9 Heparin Dosing Weight:   Vital Signs: Temp: 98.4 F (36.9 C) (01/21 1039) Temp Source: Oral (01/21 1039) BP: 98/55 mmHg (01/21 1039) Pulse Rate: 86 (01/21 1039)  Labs:  Recent Labs  05/17/14 0939 05/18/14 1500 05/19/14 1322  HGB  --  11.9*  --   HCT  --  35.8*  --   PLT  --  325  --   LABPROT  --  35.1* 31.2*  INR 4.5 3.46* 2.98*  CREATININE  --  1.15  --     Estimated Creatinine Clearance: 47.3 mL/min (by C-G formula based on Cr of 1.15).   Medical History: Past Medical History  Diagnosis Date  . PAF (paroxysmal atrial fibrillation)     Managed with rate control and coumadin  . COPD (chronic obstructive pulmonary disease)   . HTN (hypertension)   . Hyperlipemia   . OA (osteoarthritis)   . Chronic anticoagulation   . Incomplete rotator cuff tear or rupture of right shoulder, not specified as traumatic 2012  . Chronic anticoagulation   . Nocturia   . Osteoarthritis of right hip 03/31/2012  . Shortness of breath     Medications:  See EMR  Assessment: 81 yo male on warfarin PTA for AFib. INR elevated on admission 3.4, down to 2.98. Hgb 11.9, plts wnl.  PTA dose: 7.5 mg po TuThSat, 5 mg all other days   Goal of Therapy:  INR 2-3 Monitor platelets by anticoagulation protocol: Yes   Plan:  -Warfarin 2.5 mg po x1 -Daily INR   Agapito GamesAlison Jamar Weatherall, PharmD, BCPS Clinical Pharmacist Pager: [phone] 05/19/2014 2:38 PM

## 2014-05-20 LAB — PROTIME-INR
INR: 2.54 — AB (ref 0.00–1.49)
PROTHROMBIN TIME: 27.6 s — AB (ref 11.6–15.2)

## 2014-05-20 MED ORDER — WARFARIN SODIUM 5 MG PO TABS
5.0000 mg | ORAL_TABLET | Freq: Once | ORAL | Status: AC
  Administered 2014-05-20: 5 mg via ORAL
  Filled 2014-05-20: qty 1

## 2014-05-20 MED ORDER — ENSURE COMPLETE PO LIQD: 237.0000 mL | Freq: Two times a day (BID) | ORAL | Status: AC

## 2014-05-20 MED ORDER — HYDROCODONE-ACETAMINOPHEN 5-325 MG PO TABS: 1.0000 | ORAL_TABLET | Freq: Four times a day (QID) | ORAL | Status: DC | PRN

## 2014-05-20 NOTE — Discharge Summary (Addendum)
Kyle [name], is a 81 y.o. male  DOB 09/07/1926  MRN [id number].  Admission date:  05/18/2014  Admitting Physician  Hillary BowJared M Gardner, DO  Discharge Date:  05/21/2014   Primary MD  Ezequiel KayserPERINI,MARK A, MD  Recommendations for primary care physician for things to follow:   Monitor CBC, BMP and INR next visit. Must follow with orthopedics closely post discharge.   Admission Diagnosis  Hip pain [M25.559] Pain [R52] Fall [W19.XXXA] Femur fracture, left, closed, initial encounter [S72.92XA]   Discharge Diagnosis  Hip pain [M25.559] Pain [R52] Fall [W19.XXXA] Femur fracture, left, closed, initial encounter [S72.92XA]    Principal Problem:   Closed left hip fracture Active Problems:   Atrial fibrillation   Chronic anticoagulation   HTN (hypertension)      Past Medical History  Diagnosis Date  . PAF (paroxysmal atrial fibrillation)     Managed with rate control and coumadin  . COPD (chronic obstructive pulmonary disease)   . HTN (hypertension)   . Hyperlipemia   . OA (osteoarthritis)   . Chronic anticoagulation   . Incomplete rotator cuff tear or rupture of right shoulder, not specified as traumatic 2012  . Chronic anticoagulation   . Nocturia   . Osteoarthritis of right hip 03/31/2012  . Shortness of breath     Past Surgical History  Procedure Laterality Date  . Total hip arthroplasty  1998  . Shoulder arthroscopy  1994    RIGHT  . Cholecystectomy    . Appendectomy    . Cataract extraction w/ intraocular lens  implant, bilateral    . Right knee arthroscopy    . Total hip arthroplasty  03/31/2012    RIGHT HIP  . Total hip arthroplasty  03/31/2012    Procedure: TOTAL HIP ARTHROPLASTY;  Surgeon: Eulas PostJoshua P Landau, MD;  Location: [facility] OR;  Service: Orthopedics;  Laterality: Right;       History of present illness and   Hospital Course:     Kindly see H&P for history of present illness and admission details, please review complete Labs, Consult reports and Test reports for all details in brief  HPI  from the history and physical done on the day of admission  Kyle [name] is a 81 y.o. male with h/o B hip replacements in the past, L hip prosthesis in 1998, presents to ED after suffering a mechanical fall in his yard earlier today. He has had L hip pain and inability to ambulate on it since that time. Work up in ED demonstrates non-displaced L hip fractures on CT scan.   Hospital Course    1. Mechanical fall with left hip fracture and left wrist injury. Seen by orthopedics. For now conservative management, PT to evaluate, Will require placement. Touch toe weightbearing only on the left leg. Supportive care continued. MRI left wrist is stable. Refused SNF,HHPT upon DC.   2. Paroxysmal atrial fibrillation. On Coumadin. Continue monitoring closely. INR was high upon admission.Continue beta blocker and digoxin for rate control.   Recent Labs  Lab Results  Component Value Date   INR 2.54* 05/20/2014   INR 2.98* 05/19/2014   INR 3.46* 05/18/2014      3. Essential hypertension. Blood pressure stable on beta blocker. Continue the same dose.    4.BPH - continue Alfuzosin.    5. Dyslipidemia. On statin.     Discharge Condition: Stable   Follow UP  Follow-up Information    Follow up with PERINI,MARK A, MD. Schedule an appointment as soon as possible for a visit in 2 days.   Specialty:  Internal Medicine   Contact information:   [address] [place] [address] [phone]       Follow up with Eulas Post, MD. Schedule an appointment as soon as possible for a visit in 1 week.   Specialty:  Orthopedic Surgery   Contact information:   [address] [place] [address] [phone]         Discharge Instructions  and  Discharge  Medications          Discharge Instructions    Diet - low sodium heart healthy    Complete by:  As directed      Discharge instructions    Complete by:  As directed   Follow with Primary MD Rodrigo Ran A, MD in 2 days   Get CBC, CMP, INR, 2 view Chest X ray checked  by SNF MD in 2 days.    Activity: Touch toe weightbearing on left hip,   with Full fall precautions use walker/cane & assistance as needed   Disposition SNF   Diet: Heart Healthy    For Heart failure patients - Check your Weight same time everyday, if you gain over 2 pounds, or you develop in leg swelling, experience more shortness of breath or chest pain, call your Primary MD immediately. Follow Cardiac Low Salt Diet and 1.8 lit/day fluid restriction.   On your next visit with your primary care physician please Get Medicines reviewed and adjusted.   Please request your Prim.MD to go over all Hospital Tests and Procedure/Radiological results at the follow up, please get all Hospital records sent to your Prim MD by signing hospital release before you go home.   If you experience worsening of your admission symptoms, develop shortness of breath, life threatening emergency, suicidal or homicidal thoughts you must seek medical attention immediately by calling 911 or calling your MD immediately  if symptoms less severe.  You Must read complete instructions/literature along with all the possible adverse reactions/side effects for all the Medicines you take and that have been prescribed to you. Take any new Medicines after you have completely understood and accpet all the possible adverse reactions/side effects.   Do not drive, operating heavy machinery, perform activities at heights, swimming or participation in water activities or provide baby sitting services if your were admitted for syncope or siezures until you have seen by Primary MD or a Neurologist and advised to do so again.  Do not drive when taking Pain  medications.    Do not take more than prescribed Pain, Sleep and Anxiety Medications  Special Instructions: If you have smoked or chewed Tobacco  in the last 2 yrs please stop smoking, stop any regular Alcohol  and or any Recreational drug use.  Wear Seat belts while driving.   Please note  You were cared for by a hospitalist during your hospital stay. If you have any questions about your discharge medications or the care you received while  you were in the hospital after you are discharged, you can call the unit and asked to speak with the hospitalist on call if the hospitalist that took care of you is not available. Once you are discharged, your primary care physician will handle any further medical issues. Please note that NO REFILLS for any discharge medications will be authorized once you are discharged, as it is imperative that you return to your primary care physician (or establish a relationship with a primary care physician if you do not have one) for your aftercare needs so that they can reassess your need for medications and monitor your lab values.     Increase activity slowly    Complete by:  As directed             Medication List    TAKE these medications        atenolol 50 MG tablet  Commonly known as:  TENORMIN  Take 0.5 tablets (25 mg total) by mouth daily.     atorvastatin 10 MG tablet  Commonly known as:  LIPITOR  Take 10 mg by mouth daily.     digoxin 0.125 MG tablet  Commonly known as:  LANOXIN  Take 1 tablet (125 mcg total) by mouth daily.     feeding supplement (ENSURE COMPLETE) Liqd  Take 237 mLs by mouth 2 (two) times daily between meals.     HYDROcodone-acetaminophen 5-325 MG per tablet  Commonly known as:  NORCO/VICODIN  Take 1 tablet by mouth every 6 (six) hours as needed for moderate pain.     UROXATRAL PO  Take 10 mg by mouth 2 (two) times daily.     warfarin 5 MG tablet  Commonly known as:  COUMADIN  - Take 5-7.5 mg by mouth daily at 6  PM. Takes 7.5mg  on tues, thurs and sat  - Takes  all other days          Diet and Activity recommendation: See Discharge Instructions above   Consults obtained - Ortho Landau   Major procedures and Radiology Reports - PLEASE review detailed and final reports for all details, in brief -       Dg Chest 1 View  05/18/2014   CLINICAL DATA:  Fall, left hip pain  EXAM: CHEST - 1 VIEW  COMPARISON:  08/14/2011  FINDINGS: Cardiomediastinal silhouette is stable. No acute infiltrate or pulmonary edema. Again noted hyperinflation and chronic mild interstitial prominence. Stable calcified granuloma left midlung.  IMPRESSION: No active disease. Stable hyperinflation and chronic mild interstitial prominence. Stable calcified granuloma left midlung.   Electronically Signed   By: Natasha Mead M.D.   On: 05/18/2014 15:58   Dg Wrist 2 Views Left  05/19/2014   CLINICAL DATA:  Arthritis. Pain and swelling. Redness. Noted injury. Initial evaluation.  EXAM: LEFT WRIST - 2 VIEW  COMPARISON:  None.  FINDINGS: Small bone fragments are noted the base of the first metatarsal. Small bone fragments are noted posterior to the wrist. These could represent acute or old fracture fragments. Radiocarpal, intercarpal, and carpometacarpal mild degenerative changes are present. Diffuse soft tissue swelling is present. No radiopaque foreign body. If symptoms remain MRI may prove useful for further evaluation.  IMPRESSION: 1. Small bone fragments are noted the base of first metatarsal and along the posterior wrist. These may be old fracture fragments. 2. Degenerative changes radiocarpal, intercarpal, and carpometacarpal joints. Degenerative changes are mild. No erosive arthropathy. 3. Diffuse soft tissue swelling is present. No radiopaque foreign body. If  symptoms remain MRI of the left wrist can be obtained for further evaluation.   Electronically Signed   By: Maisie Fus  Register   On: 05/19/2014 09:27   Mr Wrist Left Wo  Contrast  05/20/2014   CLINICAL DATA:  Possible fracture at the base of the first metacarpal. Patient is status post fall 05/18/2014.  EXAM: MR OF THE LEFT WRIST WITHOUT CONTRAST  TECHNIQUE: Multiplanar, multisequence MR imaging of the left wrist was performed. No intravenous contrast was administered.  COMPARISON:  None.  FINDINGS: There is severe patient motion degrading image quality limiting evaluation.  Ligaments: Scapholunate and lunotriquetral ligaments are grossly intact.  Triangular fibrocartilage: Grossly intact.  Tendons: Flexor and extensor compartment tendons are grossly intact.  Carpal tunnel/median nerve: Grossly normal carpal tunnel. Grossly normal median nerve.  Guyon's canal: Normal.  Joint/cartilage: No significant joint effusion. Normal alignment. 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 Mild osteoarthritis of the first CMC joint.  Bones/carpal alignment: No marrow signal abnormality. Normal alignment. No acute fracture or dislocation.  Other: No fluid collection. No hematoma. Mild subcutaneous edema along the dorsum of the wrist and hands.  IMPRESSION: Extremely limited examination secondary to patient motion degrading image quality.  1. No acute osseous injury of the left wrist.   Electronically Signed   By: Elige Ko   On: 05/20/2014 08:05   Ct Hip Left Wo Contrast  05/18/2014   CLINICAL DATA:  PT. FELL, C/O HIP PAIN, HX BILATERAL HIP REPLACEMENTS, SIGNIFICANT ARTIFACT AS A RESULT,  EXAM: CT OF THE LEFT HIP WITHOUT CONTRAST  TECHNIQUE: Multidetector CT imaging of the left hip was performed according to the standard protocol. Multiplanar CT image reconstructions were also generated.  COMPARISON:  03/31/2012  FINDINGS: Acute nondisplaced fractures are noted of the proximal left femur.  A fracture extends across the posterior margin at the posterior lateral base of the greater trochanter, with the fracture continuing along the posterior shaft along the long axis of the femur.  Another nondisplaced fracture line extends along the anterior cortex from just below the trochanteric region to the level of the inferior tip of the femoral component.  There is lucency surrounding the inferior aspect of the stem of the femoral component, which was present on the prior radiographs.  Acetabular components well seated and stable. There is no evidence of an acetabular fracture.  Soft tissue edema is seen lateral to the proximal femur.  IMPRESSION: 1. Nondisplaced fractures of the proximal left femur extending from the posterior lateral base of the greater trochanter and across the anterior and posterior cortices to the midshaft near the level of the inferior tip of the stem of the femoral component.   Electronically Signed   By: Amie Portland M.D.   On: 05/18/2014 19:38   Dg Hip Unilat With Pelvis 2-3 Views Left  05/18/2014   CLINICAL DATA:  Left hip pain post fall  EXAM: DG HIP W/ PELVIS 2-3V*L*  COMPARISON:  03/31/2012  FINDINGS: Three views of the left hip submitted. Again noted right hip prosthesis in anatomic alignment. There is a left hip prosthesis in anatomic alignment. No evidence of prosthesis loosening. No acute fracture or subluxation.  IMPRESSION: No acute fracture or subluxation. Bilateral hip prosthesis. No evidence of loosening of left hip prosthesis.   Electronically Signed   By: Natasha Mead M.D.   On: 05/18/2014 16:02   Dg Femur Min 2 Views Left  05/18/2014   CLINICAL DATA:  Fall, left hip pain  EXAM: DG FEMUR  2+V*L*  COMPARISON:  Left hip same day.  FINDINGS: Two views of mid and distal femur submitted. No acute fracture or subluxation. Partially visualized intramedullary component of left hip prosthesis without evidence of loosening.  IMPRESSION: No acute fracture or subluxation.   Electronically Signed   By: Natasha Mead M.D.   On: 05/18/2014 16:00    Micro Results      Recent Results (from the past 240 hour(s))  Surgical pcr screen     Status: None   Collection Time:  05/19/14  1:50 AM  Result Value Ref Range Status   MRSA, PCR NEGATIVE NEGATIVE Final   Staphylococcus aureus NEGATIVE NEGATIVE Final    Comment:        The Xpert SA Assay (FDA approved for NASAL specimens in patients over 42 years of age), is one component of a comprehensive surveillance program.  Test performance has been validated by [facility] for patients greater than or equal to 35 year old. It is not intended to diagnose infection nor to guide or monitor treatment.        Today   Subjective:   Kyle [name] today has no headache,no chest abdominal pain,no new weakness tingling or numbness, feels much better.   Objective:   Blood pressure 122/72, pulse 81, temperature 98.3 F (36.8 C), temperature source Oral, resp. rate 16, height 6\' 1"  (1.854 m), weight 71.215 kg (157 lb), SpO2 97 %.   Intake/Output Summary (Last 24 hours) at 05/21/14 0758 Last data filed at 05/21/14 0622  Gross per 24 hour  Intake    940 ml  Output    775 ml  Net    165 ml    Exam Awake Alert, Oriented x 3, No new F.N deficits, Normal affect [place].AT,PERRAL Supple Neck,No JVD, No cervical lymphadenopathy appriciated.  Symmetrical Chest wall movement, Good air movement bilaterally, CTAB RRR,No Gallops,Rubs or new Murmurs, No Parasternal Heave +ve B.Sounds, Abd Soft, Non tender, No organomegaly appriciated, No rebound -guarding or rigidity. No Cyanosis, Clubbing or edema, No new Rash or bruise  Data Review   CBC w Diff:  Lab Results  Component Value Date   WBC 12.6* 05/18/2014   HGB 11.9* 05/18/2014   HCT 35.8* 05/18/2014   PLT 325 05/18/2014   LYMPHOPCT 19 05/18/2014   BANDSPCT 0 06/16/2009   MONOPCT 6 05/18/2014   EOSPCT 1 05/18/2014   BASOPCT 0 05/18/2014    CMP:  Lab Results  Component Value Date   NA 139 05/18/2014   K 4.2 05/18/2014   CL 103 05/18/2014   CO2 25 05/18/2014   BUN 31* 05/18/2014   CREATININE 1.15 05/18/2014   PROT 5.9* 09/17/2010   ALBUMIN 3.7  09/17/2010   BILITOT 0.6 09/17/2010   ALKPHOS 39 09/17/2010   AST 18 09/17/2010   ALT 13 09/17/2010  . Lab Results  Component Value Date   INR 2.09* 05/21/2014   INR 2.54* 05/20/2014   INR 2.98* 05/19/2014     Total Time in preparing paper work, data evaluation and todays exam - 35 minutes  Leroy Sea M.D on 05/21/2014 at 7:58 AM  [facility] Office  [phone]

## 2014-05-20 NOTE — Clinical Social Work Note (Signed)
CSW was contacted by [facility] to say that the Pharmacy will be closed on Saturday and Sunday and will need dc summary today to admit patients over the weekend.  MD paged and requested dc summary.  Vickii PennaGina Niomi Valent, LCSWA [phone]  Psychiatric & Orthopedics (5N 1-16) Clinical Social Worker

## 2014-05-20 NOTE — Progress Notes (Signed)
Patient Demographics  Kyle [name], is a 81 y.o. male, DOB - 1926-05-22, ZOX:[id number]  Admit date - 05/18/2014   Admitting Physician Hillary Bow, DO  Outpatient Primary MD for the patient is [name], MD  LOS - 2   Chief Complaint  Patient presents with  . Hip Pain  . Fall        Subjective:   [name] today has, No headache, No chest pain, No abdominal pain - No Nausea, No new weakness tingling or numbness, No Cough - SOB.  Has some pain in the left wrist and left hip.  Assessment & Plan    1. Mechanical fall with left hip fracture and left wrist injury. Seen by orthopedics. For now conservative management, PT to evaluate,  Will require placement. Supportive care continued.  MRI left wrist is stable.   2. Paroxysmal atrial fibrillation. On Coumadin. Pharmacy monitoring. Currently INR supratherapeutic. Continue beta blocker and digoxin for rate control.  Lab Results  Component Value Date   INR 2.54* 05/20/2014   INR 2.98* 05/19/2014   INR 3.46* 05/18/2014    3. Essential hypertension. Blood pressure stable on beta blocker. Continue the same dose.    4.BPH - continue Alfuzosin.    5. Dyslipidemia. On statin.    Code Status: Full  Family Communication: None present  Disposition Plan: SNF   Procedures   CT left hip. MRI left wrist   Consults  Ortho   Medications  Scheduled Meds: . alfuzosin  10 mg Oral BID  . atenolol  25 mg Oral Daily  . atorvastatin  10 mg Oral Daily  . Chlorhexidine Gluconate Cloth  6 each Topical Daily  . digoxin  125 mcg Oral Daily  . feeding supplement (ENSURE COMPLETE)  237 mL Oral BID BM  . mupirocin ointment  1 application Nasal BID  . Warfarin - Pharmacist Dosing Inpatient   Does not apply q1800   Continuous Infusions:    PRN Meds:.HYDROcodone-acetaminophen, methocarbamol (ROBAXIN)  IV  DVT Prophylaxis  Coumadin  Lab Results  Component Value Date   INR 2.54* 05/20/2014   INR 2.98* 05/19/2014   INR 3.46* 05/18/2014     Lab Results  Component Value Date   PLT 325 05/18/2014    Antibiotics     Anti-infectives    None          Objective:   Filed Vitals:   05/19/14 1600 05/19/14 2136 05/20/14 0521 05/20/14 0732  BP:  104/63 126/84   Pulse:  80 82   Temp:  98 F (36.7 C) 98.1 F (36.7 C)   TempSrc:  Oral Oral   Resp: Height:      Weight:      SpO2: 98% 99% 99%     Wt Readings from Last 3 Encounters:  05/18/14 71.215 kg (157 lb)  02/09/13 73.936 kg (163 lb)  08/10/12 74.39 kg (164 lb)     Intake/Output Summary (Last 24 hours) at 05/20/14 1008 Last data filed at 05/20/14 0938  Gross per 24 hour  Intake    240 ml  Output    870 ml  Net   -630 ml     Physical Exam  Awake Alert, Oriented X  3, No new F.N deficits, Normal affect [place].AT,PERRAL Supple Neck,No JVD, No cervical lymphadenopathy appriciated.  Symmetrical Chest wall movement, Good air movement bilaterally, CTAB RRR,No Gallops,Rubs or new Murmurs, No Parasternal Heave +ve B.Sounds, Abd Soft, No tenderness, No organomegaly appriciated, No rebound - guarding or rigidity. No Cyanosis, Clubbing or edema, No new Rash or bruise  Tender left hip and left wrist.   Data Review   Micro Results Recent Results (from the past 240 hour(s))  Surgical pcr screen     Status: None   Collection Time: 05/19/14  1:50 AM  Result Value Ref Range Status   MRSA, PCR NEGATIVE NEGATIVE Final   Staphylococcus aureus NEGATIVE NEGATIVE Final    Comment:        The Xpert SA Assay (FDA approved for NASAL specimens in patients over 79 years of age), is one component of a comprehensive surveillance program.  Test performance has been validated by [facility] for patients greater than or equal to 79 year old. It is not  intended to diagnose infection nor to guide or monitor treatment.     Radiology Reports Dg Chest 1 View  05/18/2014   CLINICAL DATA:  Fall, left hip pain  EXAM: CHEST - 1 VIEW  COMPARISON:  08/14/2011  FINDINGS: Cardiomediastinal silhouette is stable. No acute infiltrate or pulmonary edema. Again noted hyperinflation and chronic mild interstitial prominence. Stable calcified granuloma left midlung.  IMPRESSION: No active disease. Stable hyperinflation and chronic mild interstitial prominence. Stable calcified granuloma left midlung.   Electronically Signed   By: Natasha MeadLiviu  Pop M.D.   On: 05/18/2014 15:58   Dg Wrist 2 Views Left  05/19/2014   CLINICAL DATA:  Arthritis. Pain and swelling. Redness. Noted injury. Initial evaluation.  EXAM: LEFT WRIST - 2 VIEW  COMPARISON:  None.  FINDINGS: Small bone fragments are noted the base of the first metatarsal. Small bone fragments are noted posterior to the wrist. These could represent acute or old fracture fragments. Radiocarpal, intercarpal, and carpometacarpal mild degenerative changes are present. Diffuse soft tissue swelling is present. No radiopaque foreign body. If symptoms remain MRI may prove useful for further evaluation.  IMPRESSION: 1. Small bone fragments are noted the base of first metatarsal and along the posterior wrist. These may be old fracture fragments. 2. Degenerative changes radiocarpal, intercarpal, and carpometacarpal joints. Degenerative changes are mild. No erosive arthropathy. 3. Diffuse soft tissue swelling is present. No radiopaque foreign body. If symptoms remain MRI of the left wrist can be obtained for further evaluation.   Electronically Signed   By: Maisie Fushomas  Register   On: 05/19/2014 09:27   Mr Wrist Left Wo Contrast  05/20/2014   CLINICAL DATA:  Possible fracture at the base of the first metacarpal. Patient is status post fall 05/18/2014.  EXAM: MR OF THE LEFT WRIST WITHOUT CONTRAST  TECHNIQUE: Multiplanar, multisequence MR imaging  of the left wrist was performed. No intravenous contrast was administered.  COMPARISON:  None.  FINDINGS: There is severe patient motion degrading image quality limiting evaluation.  Ligaments: Scapholunate and lunotriquetral ligaments are grossly intact.  Triangular fibrocartilage: Grossly intact.  Tendons: Flexor and extensor compartment tendons are grossly intact.  Carpal tunnel/median nerve: Grossly normal carpal tunnel. Grossly normal median nerve.  Guyon's canal: Normal.  Joint/cartilage: No significant joint effusion. Normal alignment. 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 Mild osteoarthritis of the first CMC joint.  Bones/carpal alignment: No marrow signal abnormality. Normal alignment. No acute fracture or dislocation.  Other: No fluid collection.  No hematoma. Mild subcutaneous edema along the dorsum of the wrist and hands.  IMPRESSION: Extremely limited examination secondary to patient motion degrading image quality.  1. No acute osseous injury of the left wrist.   Electronically Signed   By: Elige Ko   On: 05/20/2014 08:05   Ct Hip Left Wo Contrast  05/18/2014   CLINICAL DATA:  PT. FELL, C/O HIP PAIN, HX BILATERAL HIP REPLACEMENTS, SIGNIFICANT ARTIFACT AS A RESULT,  EXAM: CT OF THE LEFT HIP WITHOUT CONTRAST  TECHNIQUE: Multidetector CT imaging of the left hip was performed according to the standard protocol. Multiplanar CT image reconstructions were also generated.  COMPARISON:  03/31/2012  FINDINGS: Acute nondisplaced fractures are noted of the proximal left femur.  A fracture extends across the posterior margin at the posterior lateral base of the greater trochanter, with the fracture continuing along the posterior shaft along the long axis of the femur. Another nondisplaced fracture line extends along the anterior cortex from just below the trochanteric region to the level of the inferior tip of the femoral component.  There is lucency surrounding the inferior aspect of the stem of the  femoral component, which was present on the prior radiographs.  Acetabular components well seated and stable. There is no evidence of an acetabular fracture.  Soft tissue edema is seen lateral to the proximal femur.  IMPRESSION: 1. Nondisplaced fractures of the proximal left femur extending from the posterior lateral base of the greater trochanter and across the anterior and posterior cortices to the midshaft near the level of the inferior tip of the stem of the femoral component.   Electronically Signed   By: Amie Portland M.D.   On: 05/18/2014 19:38   Dg Hip Unilat With Pelvis 2-3 Views Left  05/18/2014   CLINICAL DATA:  Left hip pain post fall  EXAM: DG HIP W/ PELVIS 2-3V*L*  COMPARISON:  03/31/2012  FINDINGS: Three views of the left hip submitted. Again noted right hip prosthesis in anatomic alignment. There is a left hip prosthesis in anatomic alignment. No evidence of prosthesis loosening. No acute fracture or subluxation.  IMPRESSION: No acute fracture or subluxation. Bilateral hip prosthesis. No evidence of loosening of left hip prosthesis.   Electronically Signed   By: Natasha Mead M.D.   On: 05/18/2014 16:02   Dg Femur Min 2 Views Left  05/18/2014   CLINICAL DATA:  Fall, left hip pain  EXAM: DG FEMUR 2+V*L*  COMPARISON:  Left hip same day.  FINDINGS: Two views of mid and distal femur submitted. No acute fracture or subluxation. Partially visualized intramedullary component of left hip prosthesis without evidence of loosening.  IMPRESSION: No acute fracture or subluxation.   Electronically Signed   By: Natasha Mead M.D.   On: 05/18/2014 16:00     CBC  Recent Labs Lab 05/18/14 1500  WBC 12.6*  HGB 11.9*  HCT 35.8*  PLT 325  MCV 90.4  MCH 30.1  MCHC 33.2  RDW 15.0  LYMPHSABS 2.4  MONOABS 0.8  EOSABS 0.1  BASOSABS 0.0    Chemistries   Recent Labs Lab 05/18/14 1500  NA 139  K 4.2  CL 103  CO2 25  GLUCOSE 102*  BUN 31*  CREATININE 1.15  CALCIUM 9.2    ------------------------------------------------------------------------------------------------------------------ estimated creatinine clearance is 47.3 mL/min (by C-G formula based on Cr of 1.15). ------------------------------------------------------------------------------------------------------------------ No results for input(s): HGBA1C in the last 72 hours. ------------------------------------------------------------------------------------------------------------------ No results for input(s): CHOL, HDL, LDLCALC, TRIG, CHOLHDL, LDLDIRECT in  the last 72 hours. ------------------------------------------------------------------------------------------------------------------ No results for input(s): TSH, T4TOTAL, T3FREE, THYROIDAB in the last 72 hours.  Invalid input(s): FREET3 ------------------------------------------------------------------------------------------------------------------ No results for input(s): VITAMINB12, FOLATE, FERRITIN, TIBC, IRON, RETICCTPCT in the last 72 hours.  Coagulation profile  Recent Labs Lab 05/17/14 0939 05/18/14 1500 05/19/14 1322 05/20/14 0538  INR 4.5 3.46* 2.98* 2.54*    No results for input(s): DDIMER in the last 72 hours.  Cardiac Enzymes No results for input(s): CKMB, TROPONINI, MYOGLOBIN in the last 168 hours.  Invalid input(s): CK ------------------------------------------------------------------------------------------------------------------ Invalid input(s): POCBNP     Time Spent in minutes  35   SINGH,PRASHANT K M.D on 05/20/2014 at 10:08 AM  Between 7am to 7pm - Pager - [phone]  After 7pm go to [email or web address] - password [facility]  [facility] Office  [phone]

## 2014-05-20 NOTE — Discharge Instructions (Signed)
Follow with Primary MD Rodrigo RanPERINI,MARK A, MD in 2 days   Get CBC, CMP, INR, 2 view Chest X ray checked  by SNF MD in 2 days.    Activity: Touch toe weightbearing on left hip,   with Full fall precautions use walker/cane & assistance as needed   Disposition SNF   Diet: Heart Healthy    For Heart failure patients - Check your Weight same time everyday, if you gain over 2 pounds, or you develop in leg swelling, experience more shortness of breath or chest pain, call your Primary MD immediately. Follow Cardiac Low Salt Diet and 1.8 lit/day fluid restriction.   On your next visit with your primary care physician please Get Medicines reviewed and adjusted.   Please request your Prim.MD to go over all Hospital Tests and Procedure/Radiological results at the follow up, please get all Hospital records sent to your Prim MD by signing hospital release before you go home.   If you experience worsening of your admission symptoms, develop shortness of breath, life threatening emergency, suicidal or homicidal thoughts you must seek medical attention immediately by calling 911 or calling your MD immediately  if symptoms less severe.  You Must read complete instructions/literature along with all the possible adverse reactions/side effects for all the Medicines you take and that have been prescribed to you. Take any new Medicines after you have completely understood and accpet all the possible adverse reactions/side effects.   Do not drive, operating heavy machinery, perform activities at heights, swimming or participation in water activities or provide baby sitting services if your were admitted for syncope or siezures until you have seen by Primary MD or a Neurologist and advised to do so again.  Do not drive when taking Pain medications.    Do not take more than prescribed Pain, Sleep and Anxiety Medications  Special Instructions: If you have smoked or chewed Tobacco  in the last 2 yrs please stop  smoking, stop any regular Alcohol  and or any Recreational drug use.  Wear Seat belts while driving.   Please note  You were cared for by a hospitalist during your hospital stay. If you have any questions about your discharge medications or the care you received while you were in the hospital after you are discharged, you can call the unit and asked to speak with the hospitalist on call if the hospitalist that took care of you is not available. Once you are discharged, your primary care physician will handle any further medical issues. Please note that NO REFILLS for any discharge medications will be authorized once you are discharged, as it is imperative that you return to your primary care physician (or establish a relationship with a primary care physician if you do not have one) for your aftercare needs so that they can reassess your need for medications and monitor your lab values.

## 2014-05-20 NOTE — Care Management Note (Signed)
  Page 1 of 1   05/20/2014     11:00:21 AM CARE MANAGEMENT NOTE 05/20/2014  Patient:  Kyle [name],Kyle [name]   Account Number:  [id number]  Date Initiated:  05/20/2014  Documentation initiated by:  Ronny FlurryWILE,Urbano Milhouse  Subjective/Objective Assessment:     Action/Plan:   Anticipated DC Date:  05/21/2014   Anticipated DC Plan:  SKILLED NURSING FACILITY  In-house referral  Clinical Social Worker         Choice offered to / List presented to:             Status of service:   Medicare Important Message given?  YES (If response is "NO", the following Medicare IM given date fields will be blank) Date Medicare IM given:  05/20/2014 Medicare IM given by:  Ronny FlurryWILE,Ulas Zuercher Date Additional Medicare IM given:   Additional Medicare IM given by:    Discharge Disposition:  SKILLED NURSING FACILITY  Per UR Regulation:  Reviewed for med. necessity/level of care/duration of stay  If discussed at Long Length of Stay Meetings, dates discussed:    Comments:

## 2014-05-20 NOTE — Progress Notes (Signed)
Physical Therapy Treatment Patient Details Name: Kyle EdouardMyrl [name] MRN: [id number] DOB: 03/09/1927 Today's Date: 05/20/2014    History of Present Illness Adm 05/18/14 s/p fall with Lt femur peri-prosthetic fx. Ortho managing without surgery. Lt wrist MRI negative for fx. PMHx- pt reports RLE 1" shorter (he has no built up shoe); bil THA; Rt rotator cuff tear; COPD; afib    PT Comments    Pt refusing pain medicine when RN offered to pre-medicate prior to PT. Pt limited by pain in Lt thigh and agreed to take pain medicine prior to PT on next visit. Continues to agree to SNF for therapies.   Follow Up Recommendations  SNF;Supervision/Assistance - 24 hour     Equipment Recommendations  None recommended by PT    Recommendations for Other Services       Precautions / Restrictions Precautions Precautions: Posterior Hip;Fall Precaution Comments: prior bil THA Restrictions Weight Bearing Restrictions: Yes LUE Weight Bearing: Weight bearing as tolerated (elected to use PF as still swollen/painful) LLE Weight Bearing: Touchdown weight bearing    Mobility  Bed Mobility Overal bed mobility: Needs Assistance Bed Mobility: Supine to Sit     Supine to sit: Mod assist;HOB elevated     General bed mobility comments: HOB elevated; vc for technique to scoot laterally in supine with mod assist; assist to raise torso and support LLE  Transfers Overall transfer level: Needs assistance Equipment used: Left platform walker Transfers: Sit to/from Stand Sit to Stand: Min assist         General transfer comment: bed at regular height; pt pushed off surface with bil UEs; steady assist as he transitioned UEs to RW  Ambulation/Gait Ambulation/Gait assistance: Min assist Ambulation Distance (Feet): 8 Feet Assistive device: Left platform walker Gait Pattern/deviations: Step-to pattern;Decreased stride length;Trunk flexed     General Gait Details: very small steps/hops with RLE with grimace and  grunt with each hop; steady assist, especially with sharp pains; pt able to maintain TDWB   Stairs            Wheelchair Mobility    Modified Rankin (Stroke Patients Only)       Balance   Sitting-balance support: Bilateral upper extremity supported;Feet supported Sitting balance-Leahy Scale: Poor       Standing balance-Leahy Scale: Poor                      Cognition Arousal/Alertness: Awake/alert Behavior During Therapy: WFL for tasks assessed/performed Overall Cognitive Status: Within Functional Limits for tasks assessed                      Exercises General Exercises - Lower Extremity Ankle Circles/Pumps: AROM;Both;15 reps Heel Slides: AROM;Left;5 reps;Right    General Comments        Pertinent Vitals/Pain Pain Assessment: 0-10 Pain Score: 7  Pain Location: Lt thigh Pain Intervention(s): Limited activity within patient's tolerance;Monitored during session;Repositioned;Patient requesting pain meds-RN notified (RN encouraged pain meds prior to PT and pt wanted to wait)    Home Living                      Prior Function            PT Goals (current goals can now be found in the care plan section) Acute Rehab PT Goals Patient Stated Goal: get back to moving himself Progress towards PT goals: Progressing toward goals    Frequency  Min 3X/week    PT Plan  Current plan remains appropriate    Co-evaluation             End of Session Equipment Utilized During Treatment: Gait belt Activity Tolerance: Patient limited by pain (educated pt importance of pre-medication for pain) Patient left: in chair;with call bell/phone within reach;with chair alarm set     Time: 1005-1030 PT Time Calculation (min) (ACUTE ONLY): 25 min  Charges:  $Gait Training: 8-22 mins $Therapeutic Exercise: 8-22 mins                    G Codes:      Kyle [name] 06/12/2014, 10:43 AM Pager [phone]

## 2014-05-20 NOTE — Progress Notes (Signed)
ANTICOAGULATION CONSULT NOTE - Initial Consult  Pharmacy Consult for warfarin Indication: atrial fibrillation  Allergies  Allergen Reactions  . Advair Diskus [Fluticasone-Salmeterol] Other (See Comments)  . Dilaudid [Hydromorphone Hcl] Other (See Comments)    Makes patient very loopy and disoriented  . Morphine And Related Other (See Comments)    Patient Measurements: Height: 6\' 1"  (185.4 cm) Weight: 157 lb (71.215 kg) IBW/kg (Calculated) : 79.9 Heparin Dosing Weight:   Vital Signs: Temp: 98.1 F (36.7 C) (01/22 0521) Temp Source: Oral (01/22 0521) BP: 126/84 mmHg (01/22 0521) Pulse Rate: 82 (01/22 0521)  Labs:  Recent Labs  05/18/14 1500 05/19/14 1322 05/20/14 0538  HGB 11.9*  --   --   HCT 35.8*  --   --   PLT 325  --   --   LABPROT 35.1* 31.2* 27.6*  INR 3.46* 2.98* 2.54*  CREATININE 1.15  --   --     Estimated Creatinine Clearance: 47.3 mL/min (by C-G formula based on Cr of 1.15).   Medical History: Past Medical History  Diagnosis Date  . PAF (paroxysmal atrial fibrillation)     Managed with rate control and coumadin  . COPD (chronic obstructive pulmonary disease)   . HTN (hypertension)   . Hyperlipemia   . OA (osteoarthritis)   . Chronic anticoagulation   . Incomplete rotator cuff tear or rupture of right shoulder, not specified as traumatic 2012  . Chronic anticoagulation   . Nocturia   . Osteoarthritis of right hip 03/31/2012  . Shortness of breath     Medications:  See EMR  Assessment: 81 yo male on warfarin PTA for AFib. INR elevated on admission 3.4, down to 2.5. Hgb 11.9, plts wnl.  PTA dose: 7.5 mg po TuThSat, 5 mg all other days   Goal of Therapy:  INR 2-3 Monitor platelets by anticoagulation protocol: Yes   Plan:  -Warfarin 5 mg po x1 -Daily INR   Agapito GamesAlison Chibuike Fleek, PharmD, BCPS Clinical Pharmacist Pager: [phone] 05/20/2014 12:36 PM

## 2014-05-21 LAB — PROTIME-INR
INR: 2.09 — ABNORMAL HIGH (ref 0.00–1.49)
Prothrombin Time: 23.7 seconds — ABNORMAL HIGH (ref 11.6–15.2)

## 2014-05-21 MED ORDER — WARFARIN SODIUM 7.5 MG PO TABS
7.5000 mg | ORAL_TABLET | Freq: Once | ORAL | Status: DC
  Filled 2014-05-21: qty 1

## 2014-05-21 NOTE — Progress Notes (Signed)
LCSW notified me that [facility] will be able to take patient today. [place]. Pt will be transporting via non-emergent ambulance when available.

## 2014-05-21 NOTE — Progress Notes (Signed)
Patient Demographics  Kyle [name], is a 81 y.o. male, DOB - 05/27/1926, [id number]:[id number]  Admit date - 05/18/2014   Admitting Physician Hillary BowJared M Gardner, DO  Outpatient Primary MD for the patient is Ezequiel KayserPERINI,MARK A, MD  LOS - 3   Chief Complaint  Patient presents with  . Hip Pain  . Fall        Subjective:   [name] today has, No headache, No chest pain, No abdominal pain - No Nausea, No new weakness tingling or numbness, No Cough - SOB.  Has some pain in the left wrist and left hip.  Assessment & Plan    1. Mechanical fall with left hip fracture and left wrist injury. Seen by orthopedics. For now conservative management of left hip fracture per orthopedics, touch toe weightbearing on the left hip, PT and OT on board, Supportive care continued.  MRI left wrist is stable.    He was initially discharged to SNF on 05/20/2014, discharge summary done, he today requested he be discharged home and have requested both social worker and case management to talk to the patient. Face to face done we have ordered home PT, RN, home health aide and social worker. Home walker and bedside commode have been ordered. He is medically stable and discharged. If he changes his mind and wants to go to SNF we will change the discharge order.   2. Paroxysmal atrial fibrillation. On Coumadin. Pharmacy monitoring. Continue beta blocker and digoxin for rate control.  Lab Results  Component Value Date   INR 2.09* 05/21/2014   INR 2.54* 05/20/2014   INR 2.98* 05/19/2014    3. Essential hypertension. Blood pressure stable on beta blocker. Continue the same dose.    4.BPH - continue Alfuzosin.    5. Dyslipidemia. On statin.    Code Status: Full  Family Communication: None present  Disposition Plan:  SNF   Procedures   CT left hip. MRI left wrist   Consults  Ortho   Medications  Scheduled Meds: . alfuzosin  10 mg Oral BID  . atenolol  25 mg Oral Daily  . atorvastatin  10 mg Oral Daily  . Chlorhexidine Gluconate Cloth  6 each Topical Daily  . digoxin  125 mcg Oral Daily  . feeding supplement (ENSURE COMPLETE)  237 mL Oral BID BM  . mupirocin ointment  1 application Nasal BID  . Warfarin - Pharmacist Dosing Inpatient   Does not apply q1800   Continuous Infusions:  PRN Meds:.HYDROcodone-acetaminophen, methocarbamol (ROBAXIN)  IV  DVT Prophylaxis  Coumadin  Lab Results  Component Value Date   INR 2.09* 05/21/2014   INR 2.54* 05/20/2014   INR 2.98* 05/19/2014     Lab Results  Component Value Date   PLT 325 05/18/2014    Antibiotics     Anti-infectives    None          Objective:   Filed Vitals:   05/20/14 1338 05/20/14 1548 05/20/14 2156 05/21/14 0621  BP: 124/75  117/61 122/72  Pulse: 80  79 81  Temp: 98.4 F (36.9 C)  98.4 F (36.9 C) 98.3 F (36.8 C)  TempSrc: Oral  Oral Oral  Resp: 18 16 15 16   Height:  Weight:      SpO2: 99%  91% 97%    Wt Readings from Last 3 Encounters:  05/18/14 71.215 kg (157 lb)  02/09/13 73.936 kg (163 lb)  08/10/12 74.39 kg (164 lb)     Intake/Output Summary (Last 24 hours) at 05/21/14 0938 Last data filed at 05/21/14 0622  Gross per 24 hour  Intake    700 ml  Output    675 ml  Net     25 ml     Physical Exam  Awake Alert, Oriented X 3, No new F.N deficits, Normal affect [place].AT,PERRAL Supple Neck,No JVD, No cervical lymphadenopathy appriciated.  Symmetrical Chest wall movement, Good air movement bilaterally, CTAB RRR,No Gallops,Rubs or new Murmurs, No Parasternal Heave +ve B.Sounds, Abd Soft, No tenderness, No organomegaly appriciated, No rebound - guarding or rigidity. No Cyanosis, Clubbing or edema, No new Rash or bruise  Tender left hip and left wrist.   Data Review   Micro  Results Recent Results (from the past 240 hour(s))  Surgical pcr screen     Status: None   Collection Time: 05/19/14  1:50 AM  Result Value Ref Range Status   MRSA, PCR NEGATIVE NEGATIVE Final   Staphylococcus aureus NEGATIVE NEGATIVE Final    Comment:        The Xpert SA Assay (FDA approved for NASAL specimens in patients over 43 years of age), is one component of a comprehensive surveillance program.  Test performance has been validated by [facility] for patients greater than or equal to 69 year old. It is not intended to diagnose infection nor to guide or monitor treatment.     Radiology Reports Dg Chest 1 View  05/18/2014   CLINICAL DATA:  Fall, left hip pain  EXAM: CHEST - 1 VIEW  COMPARISON:  08/14/2011  FINDINGS: Cardiomediastinal silhouette is stable. No acute infiltrate or pulmonary edema. Again noted hyperinflation and chronic mild interstitial prominence. Stable calcified granuloma left midlung.  IMPRESSION: No active disease. Stable hyperinflation and chronic mild interstitial prominence. Stable calcified granuloma left midlung.   Electronically Signed   By: Natasha Mead M.D.   On: 05/18/2014 15:58   Dg Wrist 2 Views Left  05/19/2014   CLINICAL DATA:  Arthritis. Pain and swelling. Redness. Noted injury. Initial evaluation.  EXAM: LEFT WRIST - 2 VIEW  COMPARISON:  None.  FINDINGS: Small bone fragments are noted the base of the first metatarsal. Small bone fragments are noted posterior to the wrist. These could represent acute or old fracture fragments. Radiocarpal, intercarpal, and carpometacarpal mild degenerative changes are present. Diffuse soft tissue swelling is present. No radiopaque foreign body. If symptoms remain MRI may prove useful for further evaluation.  IMPRESSION: 1. Small bone fragments are noted the base of first metatarsal and along the posterior wrist. These may be old fracture fragments. 2. Degenerative changes radiocarpal, intercarpal, and carpometacarpal  joints. Degenerative changes are mild. No erosive arthropathy. 3. Diffuse soft tissue swelling is present. No radiopaque foreign body. If symptoms remain MRI of the left wrist can be obtained for further evaluation.   Electronically Signed   By: Maisie Fus  Register   On: 05/19/2014 09:27   Mr Wrist Left Wo Contrast  05/20/2014   CLINICAL DATA:  Possible fracture at the base of the first metacarpal. Patient is status post fall 05/18/2014.  EXAM: MR OF THE LEFT WRIST WITHOUT CONTRAST  TECHNIQUE: Multiplanar, multisequence MR imaging of the left wrist was performed. No intravenous contrast was administered.  COMPARISON:  None.  FINDINGS: There is severe patient motion degrading image quality limiting evaluation.  Ligaments: Scapholunate and lunotriquetral ligaments are grossly intact.  Triangular fibrocartilage: Grossly intact.  Tendons: Flexor and extensor compartment tendons are grossly intact.  Carpal tunnel/median nerve: Grossly normal carpal tunnel. Grossly normal median nerve.  Guyon's canal: Normal.  Joint/cartilage: No significant joint effusion. Normal alignment. 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 Mild osteoarthritis of the first CMC joint.  Bones/carpal alignment: No marrow signal abnormality. Normal alignment. No acute fracture or dislocation.  Other: No fluid collection. No hematoma. Mild subcutaneous edema along the dorsum of the wrist and hands.  IMPRESSION: Extremely limited examination secondary to patient motion degrading image quality.  1. No acute osseous injury of the left wrist.   Electronically Signed   By: Elige Ko   On: 05/20/2014 08:05   Ct Hip Left Wo Contrast  05/18/2014   CLINICAL DATA:  PT. FELL, C/O HIP PAIN, HX BILATERAL HIP REPLACEMENTS, SIGNIFICANT ARTIFACT AS A RESULT,  EXAM: CT OF THE LEFT HIP WITHOUT CONTRAST  TECHNIQUE: Multidetector CT imaging of the left hip was performed according to the standard protocol. Multiplanar CT image reconstructions were also  generated.  COMPARISON:  03/31/2012  FINDINGS: Acute nondisplaced fractures are noted of the proximal left femur.  A fracture extends across the posterior margin at the posterior lateral base of the greater trochanter, with the fracture continuing along the posterior shaft along the long axis of the femur. Another nondisplaced fracture line extends along the anterior cortex from just below the trochanteric region to the level of the inferior tip of the femoral component.  There is lucency surrounding the inferior aspect of the stem of the femoral component, which was present on the prior radiographs.  Acetabular components well seated and stable. There is no evidence of an acetabular fracture.  Soft tissue edema is seen lateral to the proximal femur.  IMPRESSION: 1. Nondisplaced fractures of the proximal left femur extending from the posterior lateral base of the greater trochanter and across the anterior and posterior cortices to the midshaft near the level of the inferior tip of the stem of the femoral component.   Electronically Signed   By: Amie Portland M.D.   On: 05/18/2014 19:38   Dg Hip Unilat With Pelvis 2-3 Views Left  05/18/2014   CLINICAL DATA:  Left hip pain post fall  EXAM: DG HIP W/ PELVIS 2-3V*L*  COMPARISON:  03/31/2012  FINDINGS: Three views of the left hip submitted. Again noted right hip prosthesis in anatomic alignment. There is a left hip prosthesis in anatomic alignment. No evidence of prosthesis loosening. No acute fracture or subluxation.  IMPRESSION: No acute fracture or subluxation. Bilateral hip prosthesis. No evidence of loosening of left hip prosthesis.   Electronically Signed   By: Natasha Mead M.D.   On: 05/18/2014 16:02   Dg Femur Min 2 Views Left  05/18/2014   CLINICAL DATA:  Fall, left hip pain  EXAM: DG FEMUR 2+V*L*  COMPARISON:  Left hip same day.  FINDINGS: Two views of mid and distal femur submitted. No acute fracture or subluxation. Partially visualized intramedullary  component of left hip prosthesis without evidence of loosening.  IMPRESSION: No acute fracture or subluxation.   Electronically Signed   By: Natasha Mead M.D.   On: 05/18/2014 16:00     CBC  Recent Labs Lab 05/18/14 1500  WBC 12.6*  HGB 11.9*  HCT 35.8*  PLT 325  MCV 90.4  MCH  30.1  MCHC 33.2  RDW 15.0  LYMPHSABS 2.4  MONOABS 0.8  EOSABS 0.1  BASOSABS 0.0    Chemistries   Recent Labs Lab 05/18/14 1500  NA 139  K 4.2  CL 103  CO2 25  GLUCOSE 102*  BUN 31*  CREATININE 1.15  CALCIUM 9.2   ------------------------------------------------------------------------------------------------------------------ estimated creatinine clearance is 47.3 mL/min (by C-G formula based on Cr of 1.15). ------------------------------------------------------------------------------------------------------------------ No results for input(s): HGBA1C in the last 72 hours. ------------------------------------------------------------------------------------------------------------------ No results for input(s): CHOL, HDL, LDLCALC, TRIG, CHOLHDL, LDLDIRECT in the last 72 hours. ------------------------------------------------------------------------------------------------------------------ No results for input(s): TSH, T4TOTAL, T3FREE, THYROIDAB in the last 72 hours.  Invalid input(s): FREET3 ------------------------------------------------------------------------------------------------------------------ No results for input(s): VITAMINB12, FOLATE, FERRITIN, TIBC, IRON, RETICCTPCT in the last 72 hours.  Coagulation profile  Recent Labs Lab 05/17/14 0939 05/18/14 1500 05/19/14 1322 05/20/14 0538 05/21/14 0330  INR 4.5 3.46* 2.98* 2.54* 2.09*    No results for input(s): DDIMER in the last 72 hours.  Cardiac Enzymes No results for input(s): CKMB, TROPONINI, MYOGLOBIN in the last 168 hours.  Invalid input(s):  CK ------------------------------------------------------------------------------------------------------------------ Invalid input(s): POCBNP     Time Spent in minutes  35   SINGH,PRASHANT K M.D on 05/21/2014 at 9:38 AM  Between 7am to 7pm - Pager - [phone]  After 7pm go to [email or web address] - password [facility]  [facility] Office  [phone]

## 2014-05-21 NOTE — Progress Notes (Signed)
ANTICOAGULATION CONSULT NOTE - Follow-up Consult  Pharmacy Consult for warfarin Indication: atrial fibrillation  Allergies  Allergen Reactions  . Advair Diskus [Fluticasone-Salmeterol] Other (See Comments)  . Dilaudid [Hydromorphone Hcl] Other (See Comments)    Makes patient very loopy and disoriented  . Morphine And Related Other (See Comments)    Patient Measurements: Height: 6\' 1"  (185.4 cm) Weight: 157 lb (71.215 kg) IBW/kg (Calculated) : 79.9  Vital Signs: Temp: 98.3 F (36.8 C) (01/23 0621) Temp Source: Oral (01/23 0621) BP: 122/72 mmHg (01/23 0621) Pulse Rate: 81 (01/23 0621)  Labs:  Recent Labs  05/18/14 1500 05/19/14 1322 05/20/14 0538 05/21/14 0330  HGB 11.9*  --   --   --   HCT 35.8*  --   --   --   PLT 325  --   --   --   LABPROT 35.1* 31.2* 27.6* 23.7*  INR 3.46* 2.98* 2.54* 2.09*  CREATININE 1.15  --   --   --     Estimated Creatinine Clearance: 47.3 mL/min (by C-G formula based on Cr of 1.15).  Assessment: 81 yo male on warfarin PTA for AFib. INR elevated on admission 3.4, now down to 2.09 - reduced doses x 2 days. CBC stable, no bleeding noted.  PTA dose: 7.5 mg po TuThSat, 5 mg all other days   Goal of Therapy:  INR 2-3 Monitor platelets by anticoagulation protocol: Yes   Plan:  -Warfarin 7.5mg  today -Daily INR  Christoper Fabianaron Lavonia Eager, PharmD, BCPS Clinical pharmacist, pager [phone] 05/21/2014 11:15 AM

## 2014-05-21 NOTE — Progress Notes (Signed)
NCM spoke to pt and states he lives at home with his wife. Wife has dementia. States his son is caring for her at home while he is in the hospital. Gave permission to speak with son. Pt is agreeable to SNF but is concerned about his wife. NCM contacted CSW and he will attempt to dc to another SNF this weekend. [facility] unable to accept pt this weekend. NCM contacted son, Verne CarrowWilliam Amaker [phone]. He is agreeable to recommendation for SNF. He prefers pt dc to [facility] on Monday. He will be able to care for his mother until pt is dc from SNF-rehab. Isidoro DonningAlesia Talia Hoheisel RN CCM Case Mgmt phone [phone]

## 2014-05-22 NOTE — Social Work (Signed)
CSW completed patient's discharge to SNF. CSW will called [facility] for patient transport.  Beverly Sessionsywan J Add Dinapoli MSW, LCSW [phone]

## 2014-08-18 ENCOUNTER — Other Ambulatory Visit (HOSPITAL_COMMUNITY): Payer: Self-pay | Admitting: Orthopedic Surgery

## 2014-08-18 DIAGNOSIS — Z96642 Presence of left artificial hip joint: Secondary | ICD-10-CM

## 2014-08-30 ENCOUNTER — Encounter (HOSPITAL_COMMUNITY)
Admission: RE | Admit: 2014-08-30 | Discharge: 2014-08-30 | Disposition: A | Discharge status: Home / Self Care | Payer: Medicare Other | Source: Ambulatory Visit | Attending: Orthopedic Surgery | Admitting: Orthopedic Surgery

## 2014-08-30 DIAGNOSIS — Z96642 Presence of left artificial hip joint: Secondary | ICD-10-CM | POA: Insufficient documentation

## 2014-08-30 MED ORDER — TECHNETIUM TC 99M MEDRONATE IV KIT: 25.0000 | PACK | Freq: Once | INTRAVENOUS | Status: AC | PRN

## 2014-09-09 ENCOUNTER — Other Ambulatory Visit: Payer: Self-pay | Admitting: Orthopedic Surgery

## 2014-09-09 ENCOUNTER — Ambulatory Visit (INDEPENDENT_AMBULATORY_CARE_PROVIDER_SITE_OTHER): Payer: TRICARE For Life (TFL) | Admitting: Cardiology

## 2014-09-09 DIAGNOSIS — Z96649 Presence of unspecified artificial hip joint: Principal | ICD-10-CM

## 2014-09-09 DIAGNOSIS — I480 Paroxysmal atrial fibrillation: Secondary | ICD-10-CM

## 2014-09-09 DIAGNOSIS — T84038A Mechanical loosening of other internal prosthetic joint, initial encounter: Secondary | ICD-10-CM

## 2014-09-09 LAB — POCT INR: INR: 1.8

## 2014-09-15 ENCOUNTER — Ambulatory Visit
Admission: RE | Admit: 2014-09-15 | Discharge: 2014-09-15 | Disposition: A | Discharge status: Home / Self Care | Payer: Medicare Other | Source: Ambulatory Visit | Attending: Orthopedic Surgery | Admitting: Orthopedic Surgery

## 2014-09-15 DIAGNOSIS — T84038A Mechanical loosening of other internal prosthetic joint, initial encounter: Secondary | ICD-10-CM

## 2014-09-15 DIAGNOSIS — Z96649 Presence of unspecified artificial hip joint: Principal | ICD-10-CM

## 2014-09-16 ENCOUNTER — Ambulatory Visit (INDEPENDENT_AMBULATORY_CARE_PROVIDER_SITE_OTHER): Payer: Medicare Other | Admitting: Cardiovascular Disease

## 2014-09-16 DIAGNOSIS — I480 Paroxysmal atrial fibrillation: Secondary | ICD-10-CM

## 2014-09-16 LAB — POCT INR: INR: 2.3

## 2014-09-27 ENCOUNTER — Ambulatory Visit (INDEPENDENT_AMBULATORY_CARE_PROVIDER_SITE_OTHER): Payer: Medicare Other | Admitting: Cardiology

## 2014-09-27 DIAGNOSIS — I480 Paroxysmal atrial fibrillation: Secondary | ICD-10-CM

## 2014-09-27 LAB — POCT INR: INR: 2.6

## 2014-10-04 ENCOUNTER — Encounter: Payer: Self-pay | Admitting: Internal Medicine

## 2014-10-18 ENCOUNTER — Ambulatory Visit (INDEPENDENT_AMBULATORY_CARE_PROVIDER_SITE_OTHER): Payer: Medicare Other

## 2014-10-18 DIAGNOSIS — I4891 Unspecified atrial fibrillation: Secondary | ICD-10-CM

## 2014-10-18 LAB — POCT INR: INR: 3.2

## 2014-11-08 ENCOUNTER — Ambulatory Visit (INDEPENDENT_AMBULATORY_CARE_PROVIDER_SITE_OTHER): Payer: Medicare Other | Admitting: *Deleted

## 2014-11-08 DIAGNOSIS — I4891 Unspecified atrial fibrillation: Secondary | ICD-10-CM | POA: Diagnosis not present

## 2014-11-08 LAB — POCT INR: INR: 3

## 2014-11-22 ENCOUNTER — Ambulatory Visit (INDEPENDENT_AMBULATORY_CARE_PROVIDER_SITE_OTHER): Payer: Medicare Other

## 2014-11-22 DIAGNOSIS — I4891 Unspecified atrial fibrillation: Secondary | ICD-10-CM

## 2014-11-22 LAB — POCT INR: INR: 1.9

## 2014-12-01 ENCOUNTER — Encounter (HOSPITAL_COMMUNITY): Payer: Self-pay | Admitting: *Deleted

## 2014-12-01 ENCOUNTER — Emergency Department (HOSPITAL_COMMUNITY)
Admission: EM | Admit: 2014-12-01 | Discharge: 2014-12-01 | Disposition: A | Discharge status: Home / Self Care | Payer: Medicare Other | Attending: Emergency Medicine | Admitting: Emergency Medicine

## 2014-12-01 DIAGNOSIS — E785 Hyperlipidemia, unspecified: Secondary | ICD-10-CM | POA: Diagnosis not present

## 2014-12-01 DIAGNOSIS — K5900 Constipation, unspecified: Secondary | ICD-10-CM | POA: Diagnosis not present

## 2014-12-01 DIAGNOSIS — J449 Chronic obstructive pulmonary disease, unspecified: Secondary | ICD-10-CM | POA: Diagnosis not present

## 2014-12-01 DIAGNOSIS — R339 Retention of urine, unspecified: Secondary | ICD-10-CM | POA: Diagnosis present

## 2014-12-01 DIAGNOSIS — Z7901 Long term (current) use of anticoagulants: Secondary | ICD-10-CM | POA: Insufficient documentation

## 2014-12-01 DIAGNOSIS — I10 Essential (primary) hypertension: Secondary | ICD-10-CM | POA: Insufficient documentation

## 2014-12-01 DIAGNOSIS — Z79899 Other long term (current) drug therapy: Secondary | ICD-10-CM | POA: Diagnosis not present

## 2014-12-01 DIAGNOSIS — Z720 Tobacco use: Secondary | ICD-10-CM | POA: Diagnosis not present

## 2014-12-01 LAB — BASIC METABOLIC PANEL
Anion gap: 8 (ref 5–15)
BUN: 29 mg/dL — ABNORMAL HIGH (ref 6–20)
CALCIUM: 9.4 mg/dL (ref 8.9–10.3)
CHLORIDE: 101 mmol/L (ref 101–111)
CO2: 27 mmol/L (ref 22–32)
Creatinine, Ser: 1.41 mg/dL — ABNORMAL HIGH (ref 0.61–1.24)
GFR calc Af Amer: 50 mL/min — ABNORMAL LOW (ref >60)
GFR calc non Af Amer: 44 mL/min — ABNORMAL LOW (ref >60)
Glucose, Bld: 89 mg/dL (ref 65–99)
Potassium: 4.1 mmol/L (ref 3.5–5.1)
SODIUM: 136 mmol/L (ref 135–145)

## 2014-12-01 LAB — URINE MICROSCOPIC-ADD ON

## 2014-12-01 LAB — CBC WITH DIFFERENTIAL/PLATELET
BASOS PCT: 0 % (ref 0–1)
Basophils Absolute: 0 10*3/uL (ref 0.0–0.1)
EOS ABS: 0.1 10*3/uL (ref 0.0–0.7)
EOS PCT: 0 % (ref 0–5)
HCT: 41.6 % (ref 39.0–52.0)
HEMOGLOBIN: 13.7 g/dL (ref 13.0–17.0)
LYMPHS ABS: 2.8 10*3/uL (ref 0.7–4.0)
Lymphocytes Relative: 24 % (ref 12–46)
MCH: 30.2 pg (ref 26.0–34.0)
MCHC: 32.9 g/dL (ref 30.0–36.0)
MCV: 91.6 fL (ref 78.0–100.0)
MONO ABS: 1.3 10*3/uL — AB (ref 0.1–1.0)
Monocytes Relative: 11 % (ref 3–12)
Neutro Abs: 7.6 10*3/uL (ref 1.7–7.7)
Neutrophils Relative %: 65 % (ref 43–77)
PLATELETS: 224 10*3/uL (ref 150–400)
RBC: 4.54 MIL/uL (ref 4.22–5.81)
RDW: 15.9 % — ABNORMAL HIGH (ref 11.5–15.5)
WBC: 11.8 10*3/uL — AB (ref 4.0–10.5)

## 2014-12-01 LAB — URINALYSIS, ROUTINE W REFLEX MICROSCOPIC
BILIRUBIN URINE: NEGATIVE
GLUCOSE, UA: NEGATIVE mg/dL
KETONES UR: NEGATIVE mg/dL
Leukocytes, UA: NEGATIVE
Nitrite: NEGATIVE
PH: 7 (ref 5.0–8.0)
Protein, ur: NEGATIVE mg/dL
Specific Gravity, Urine: 1.006 (ref 1.005–1.030)
UROBILINOGEN UA: 0.2 mg/dL (ref 0.0–1.0)

## 2014-12-01 LAB — PROTIME-INR
INR: 2.51 — ABNORMAL HIGH (ref 0.00–1.49)
PROTHROMBIN TIME: 26.7 s — AB (ref 11.6–15.2)

## 2014-12-01 NOTE — Discharge Instructions (Signed)
Acute Urinary Retention °Acute urinary retention is the temporary inability to urinate. °This is a common problem in older men. As men age their prostates become larger and block the flow of urine from the bladder. This is usually a problem that has come on gradually.  °HOME CARE INSTRUCTIONS °If you are sent home with a Foley catheter and a drainage system, you will need to discuss the best course of action with your health care provider. While the catheter is in, maintain a good intake of fluids. Keep the drainage bag emptied and lower than your catheter. This is so that contaminated urine will not flow back into your bladder, which could lead to a urinary tract infection. °There are two main types of drainage bags. One is a large bag that usually is used at night. It has a good capacity that will allow you to sleep through the night without having to empty it. The second type is called a leg bag. It has a smaller capacity, so it needs to be emptied more frequently. However, the main advantage is that it can be attached by a leg strap and can go underneath your clothing, allowing you the freedom to move about or leave your home. °Only take over-the-counter or prescription medicines for pain, discomfort, or fever as directed by your health care provider.  °SEEK MEDICAL CARE IF: °· You develop a low-grade fever. °· You experience spasms or leakage of urine with the spasms. °SEEK IMMEDIATE MEDICAL CARE IF:  °1. You develop chills or fever. °2. Your catheter stops draining urine. °3. Your catheter falls out. °4. You start to develop increased bleeding that does not respond to rest and increased fluid intake. °MAKE SURE YOU: °1. Understand these instructions. °2. Will watch your condition. °3. Will get help right away if you are not doing well or get worse. °Document Released: 07/22/2000 Document Revised: 04/20/2013 Document Reviewed: 09/24/2012 °ExitCare® Patient Information ©2015 [employer]. This information is  not intended to replace advice given to you by your health care provider. Make sure you discuss any questions you have with your health care provider. °Foley Catheter Care °A Foley catheter is a soft, flexible tube that is placed into the bladder to drain urine. A Foley catheter may be inserted if: °· You leak urine or are not able to control when you urinate (urinary incontinence). °· You are not able to urinate when you need to (urinary retention). °· You had prostate surgery or surgery on the genitals. °· You have certain medical conditions, such as multiple sclerosis, dementia, or a spinal cord injury. °If you are going home with a Foley catheter in place, follow the instructions below. °TAKING CARE OF THE CATHETER °5. Wash your hands with soap and water. °6. Using mild soap and warm water on a clean washcloth: °· Clean the area on your body closest to the catheter insertion site using a circular motion, moving away from the catheter. Never wipe toward the catheter because this could sweep bacteria up into the urethra and cause infection. °· Remove all traces of soap. Pat the area dry with a clean towel. For males, reposition the foreskin. °7. Attach the catheter to your leg so there is no tension on the catheter. Use adhesive tape or a leg strap. If you are using adhesive tape, remove any sticky residue left behind by the previous tape you used. °8. Keep the drainage bag below the level of the bladder, but keep it off the floor. °9. Check throughout   the day to be sure the catheter is working and urine is draining freely. Make sure the tubing does not become kinked. °10. Do not pull on the catheter or try to remove it. Pulling could damage internal tissues. °TAKING CARE OF THE DRAINAGE BAGS °You will be given two drainage bags to take home. One is a large overnight drainage bag, and the other is a smaller leg bag that fits underneath clothing. You may wear the overnight bag at any time, but you should never wear  the smaller leg bag at night. Follow the instructions below for how to empty, change, and clean your drainage bags. °Emptying the Drainage Bag °You must empty your drainage bag when it is  -½ full or at least 2-3 times a day. °4. Wash your hands with soap and water. °5. Keep the drainage bag below your hips, below the level of your bladder. This stops urine from going back into the tubing and into your bladder. °6. Hold the dirty bag over the toilet or a clean container. °7. Open the pour spout at the bottom of the bag and empty the urine into the toilet or container. Do not let the pour spout touch the toilet, container, or any other surface. Doing so can place bacteria on the bag, which can cause an infection. °8. Clean the pour spout with a gauze pad or cotton ball that has rubbing alcohol on it. °9. Close the pour spout. °10. Attach the bag to your leg with adhesive tape or a leg strap. °11. Wash your hands well. °Changing the Drainage Bag °Change your drainage bag once a month or sooner if it starts to smell bad or look dirty. Below are steps to follow when changing the drainage bag. °1. Wash your hands with soap and water. °2. Pinch off the rubber catheter so that urine does not spill out. °3. Disconnect the catheter tube from the drainage tube at the connection valve. Do not let the tubes touch any surface. °4. Clean the end of the catheter tube with an alcohol wipe. Use a different alcohol wipe to clean the end of the drainage tube. °5. Connect the catheter tube to the drainage tube of the clean drainage bag. °6. Attach the new bag to the leg with adhesive tape or a leg strap. Avoid attaching the new bag too tightly. °7. Wash your hands well. °Cleaning the Drainage Bag °1. Wash your hands with soap and water. °2. Wash the bag in warm, soapy water. °3. Rinse the bag thoroughly with warm water. °4. Fill the bag with a solution of white vinegar and water (1 cup vinegar to 1 qt warm water [.2 L vinegar to 1 L  warm water]). Close the bag and soak it for 30 minutes in the solution. °5. Rinse the bag with warm water. °6. Hang the bag to dry with the pour spout open and hanging downward. °7. Store the clean bag (once it is dry) in a clean plastic bag. °8. Wash your hands well. °PREVENTING INFECTION °· Wash your hands before and after handling your catheter. °· Take showers daily and wash the area where the catheter enters your body. Do not take baths. Replace wet leg straps with dry ones, if this applies. °· Do not use powders, sprays, or lotions on the genital area. Only use creams, lotions, or ointments as directed by your caregiver. °· For females, wipe from front to back after each bowel movement. °· Drink enough fluids to keep your urine   clear or pale yellow unless you have a fluid restriction. °· Do not let the drainage bag or tubing touch or lie on the floor. °· Wear cotton underwear to absorb moisture and to keep your skin drier. °SEEK MEDICAL CARE IF:  °· Your urine is cloudy or smells unusually bad. °· Your catheter becomes clogged. °· You are not draining urine into the bag or your bladder feels full. °· Your catheter starts to leak. °SEEK IMMEDIATE MEDICAL CARE IF:  °· You have pain, swelling, redness, or pus where the catheter enters the body. °· You have pain in the abdomen, legs, lower back, or bladder. °· You have a fever. °· You see blood fill the catheter, or your urine is pink or red. °· You have nausea, vomiting, or chills. °· Your catheter gets pulled out. °MAKE SURE YOU:  °· Understand these instructions. °· Will watch your condition. °· Will get help right away if you are not doing well or get worse. °Document Released: 04/15/2005 Document Revised: 08/30/2013 Document Reviewed: 04/06/2012 °ExitCare® Patient Information ©2015 [employer]. This information is not intended to replace advice given to you by your health care provider. Make sure you discuss any questions you have with your health care  provider. ° °

## 2014-12-01 NOTE — ED Notes (Signed)
Pt states urinary retention x 14 hours..  Was started on cipro last night.

## 2014-12-01 NOTE — ED Provider Notes (Signed)
CSN: [id number]     Arrival date & time 12/01/14  0955 History   First MD Initiated Contact with Patient 12/01/14 1009     Chief Complaint  Patient presents with  . Urinary Retention     (Consider location/radiation/quality/duration/timing/severity/associated sxs/prior Treatment) HPI  81 year old male presents with urinary retention. Last was able to urinate about 14 hours ago. Has been having urgency with little urine output over the last day or so and so his primary physician called him in Cipro last night. Took one last night and 1 this morning. Now is dripping urine but completely unable to urinate on own. Feels lower abdominal pain and distention. Has been constipated recently and took a milk of magnesia to help relieve this. Denies known prostate problems. No back pain or fevers. No vomiting. Feels like the pain radiates into his scrotum but no scrotal swelling.  Past Medical History  Diagnosis Date  . PAF (paroxysmal atrial fibrillation)     Managed with rate control and coumadin  . COPD (chronic obstructive pulmonary disease)   . HTN (hypertension)   . Hyperlipemia   . OA (osteoarthritis)   . Chronic anticoagulation   . Incomplete rotator cuff tear or rupture of right shoulder, not specified as traumatic 2012  . Chronic anticoagulation   . Nocturia   . Osteoarthritis of right hip 03/31/2012  . Shortness of breath    Past Surgical History  Procedure Laterality Date  . Total hip arthroplasty  1998  . Shoulder arthroscopy  1994    RIGHT  . Cholecystectomy    . Appendectomy    . Cataract extraction w/ intraocular lens  implant, bilateral    . Right knee arthroscopy    . Total hip arthroplasty  03/31/2012    RIGHT HIP  . Total hip arthroplasty  03/31/2012    Procedure: TOTAL HIP ARTHROPLASTY;  Surgeon: Eulas Post, MD;  Location: [facility] OR;  Service: Orthopedics;  Laterality: Right;   Family History  Problem Relation Age of Onset  . Cancer Father   . Cancer Mother   .  Other Brother     SUICIDE   History  Substance Use Topics  . Smoking status: Current Some Day Smoker -- 44 years    Types: Pipe  . Smokeless tobacco: Never Used     Comment: Smoked cigarettes early age socially  . Alcohol Use: No    Review of Systems  Constitutional: Negative for fever.  Gastrointestinal: Positive for abdominal pain, constipation and abdominal distention. Negative for nausea and vomiting.  Genitourinary: Positive for urgency, decreased urine volume and difficulty urinating. Negative for dysuria, scrotal swelling and penile pain.  Musculoskeletal: Negative for back pain.  All other systems reviewed and are negative.     Allergies  Advair diskus; Dilaudid; and Morphine and related  Home Medications   Prior to Admission medications   Medication Sig Start Date End Date Taking? Authorizing Provider  Alfuzosin HCl (UROXATRAL PO) Take 10 mg by mouth 2 (two) times daily.     Historical Provider, MD  atenolol (TENORMIN) 50 MG tablet Take 0.5 tablets (25 mg total) by mouth daily. 09/25/10   Rosalio Macadamia, NP  atorvastatin (LIPITOR) 10 MG tablet Take 10 mg by mouth daily.      Historical Provider, MD  digoxin (LANOXIN) 0.125 MG tablet Take 1 tablet (125 mcg total) by mouth daily. 09/27/10 05/18/14  Roger Shelter, MD  feeding supplement, ENSURE COMPLETE, (ENSURE COMPLETE) LIQD Take 237 mLs by mouth 2 (two)  times daily between meals. 05/20/14   Leroy Sea, MD  HYDROcodone-acetaminophen (NORCO/VICODIN) 5-325 MG per tablet Take 1 tablet by mouth every 6 (six) hours as needed for moderate pain. 05/20/14   Leroy Sea, MD  warfarin (COUMADIN) 5 MG tablet Take 5-7.5 mg by mouth daily at 6 PM. Takes 7.5mg  on tues, thurs and sat Takes 5mg  all other days    Historical Provider, MD   BP 121/58 mmHg  Pulse 74  Temp(Src) 97.8 F (36.6 C) (Oral)  Resp 18  Ht 6\' 1"  (1.854 m)  Wt 145 lb (65.772 kg)  BMI 19.13 kg/m2  SpO2 96% Physical Exam  Constitutional: He is  oriented to person, place, and time. He appears well-developed and well-nourished. No distress.  HENT:  Head: Normocephalic and atraumatic.  Right Ear: External ear normal.  Left Ear: External ear normal.  Nose: Nose normal.  Eyes: Right eye exhibits no discharge. Left eye exhibits no discharge.  Neck: Neck supple.  Cardiovascular: Normal rate, regular rhythm, normal heart sounds and intact distal pulses.   Pulmonary/Chest: Effort normal and breath sounds normal.  Abdominal: Soft. He exhibits distension (over lower abdomen/bladder). There is tenderness in the suprapubic area.  Genitourinary: Testes normal and penis normal. Right testis shows no swelling and no tenderness. Left testis shows no swelling and no tenderness.  Musculoskeletal: He exhibits no edema.  Neurological: He is alert and oriented to person, place, and time.  Skin: Skin is warm and dry. He is not diaphoretic.  Nursing note and vitals reviewed.   ED Course  Procedures (including critical care time) Labs Review Labs Reviewed  URINALYSIS, ROUTINE W REFLEX MICROSCOPIC (NOT AT [facility]) - Abnormal; Notable for the following:    Hgb urine dipstick SMALL (*)    All other components within normal limits  BASIC METABOLIC PANEL - Abnormal; Notable for the following:    BUN 29 (*)    Creatinine, Ser 1.41 (*)    GFR calc non Af Amer 44 (*)    GFR calc Af Amer 50 (*)    All other components within normal limits  CBC WITH DIFFERENTIAL/PLATELET - Abnormal; Notable for the following:    WBC 11.8 (*)    RDW 15.9 (*)    Monocytes Absolute 1.3 (*)    All other components within normal limits  PROTIME-INR - Abnormal; Notable for the following:    Prothrombin Time 26.7 (*)    INR 2.51 (*)    All other components within normal limits  URINE CULTURE  URINE MICROSCOPIC-ADD ON    Imaging Review No results found.   EKG Interpretation None      MDM   Final diagnoses:  Urinary retention    Patient had over 1500 mL of urine  out on initial foley catheter placement. Symptoms now completely resolved. No evidence of UTI, it seems he was started on Cipro due to the difficulty urinating and urgency which was most likely retention all along. I recommended they stop this and called her PCP for further directions. Creatinine is up very mildly to 1.4 which is likely from the obstruction and now that is relieved I do not feel he needs further care or admission. Will leave the Foley in and refer to his urologist. Of note the son states that he is out of his Alfluzosin and this is likely contributing as most likely prostate hypertrophy is the cause of his acute retention.     Pricilla Loveless, MD 12/01/14 [phone]

## 2014-12-02 LAB — URINE CULTURE: Culture: NO GROWTH

## 2014-12-13 ENCOUNTER — Ambulatory Visit (INDEPENDENT_AMBULATORY_CARE_PROVIDER_SITE_OTHER): Payer: Medicare Other | Admitting: *Deleted

## 2014-12-13 DIAGNOSIS — I4891 Unspecified atrial fibrillation: Secondary | ICD-10-CM | POA: Diagnosis not present

## 2014-12-13 LAB — POCT INR: INR: 2.8

## 2015-01-10 ENCOUNTER — Ambulatory Visit (INDEPENDENT_AMBULATORY_CARE_PROVIDER_SITE_OTHER): Payer: Medicare Other

## 2015-01-10 DIAGNOSIS — I4891 Unspecified atrial fibrillation: Secondary | ICD-10-CM

## 2015-01-10 LAB — POCT INR: INR: 1.9

## 2015-01-22 ENCOUNTER — Encounter (HOSPITAL_COMMUNITY): Payer: Self-pay | Admitting: Emergency Medicine

## 2015-01-22 ENCOUNTER — Emergency Department (HOSPITAL_COMMUNITY)
Admission: EM | Admit: 2015-01-22 | Discharge: 2015-01-22 | Disposition: A | Discharge status: Home / Self Care | Payer: Medicare Other | Attending: Emergency Medicine | Admitting: Emergency Medicine

## 2015-01-22 DIAGNOSIS — T83098A Other mechanical complication of other indwelling urethral catheter, initial encounter: Secondary | ICD-10-CM | POA: Diagnosis not present

## 2015-01-22 DIAGNOSIS — Z720 Tobacco use: Secondary | ICD-10-CM | POA: Insufficient documentation

## 2015-01-22 DIAGNOSIS — Z7901 Long term (current) use of anticoagulants: Secondary | ICD-10-CM | POA: Diagnosis not present

## 2015-01-22 DIAGNOSIS — I10 Essential (primary) hypertension: Secondary | ICD-10-CM | POA: Insufficient documentation

## 2015-01-22 DIAGNOSIS — Z9049 Acquired absence of other specified parts of digestive tract: Secondary | ICD-10-CM | POA: Insufficient documentation

## 2015-01-22 DIAGNOSIS — J449 Chronic obstructive pulmonary disease, unspecified: Secondary | ICD-10-CM | POA: Insufficient documentation

## 2015-01-22 DIAGNOSIS — I480 Paroxysmal atrial fibrillation: Secondary | ICD-10-CM | POA: Insufficient documentation

## 2015-01-22 DIAGNOSIS — E785 Hyperlipidemia, unspecified: Secondary | ICD-10-CM | POA: Insufficient documentation

## 2015-01-22 DIAGNOSIS — T839XXA Unspecified complication of genitourinary prosthetic device, implant and graft, initial encounter: Secondary | ICD-10-CM

## 2015-01-22 DIAGNOSIS — N390 Urinary tract infection, site not specified: Secondary | ICD-10-CM | POA: Insufficient documentation

## 2015-01-22 DIAGNOSIS — Y846 Urinary catheterization as the cause of abnormal reaction of the patient, or of later complication, without mention of misadventure at the time of the procedure: Secondary | ICD-10-CM | POA: Insufficient documentation

## 2015-01-22 DIAGNOSIS — Z79899 Other long term (current) drug therapy: Secondary | ICD-10-CM | POA: Diagnosis not present

## 2015-01-22 DIAGNOSIS — Z792 Long term (current) use of antibiotics: Secondary | ICD-10-CM | POA: Diagnosis not present

## 2015-01-22 DIAGNOSIS — M1990 Unspecified osteoarthritis, unspecified site: Secondary | ICD-10-CM | POA: Insufficient documentation

## 2015-01-22 DIAGNOSIS — R339 Retention of urine, unspecified: Secondary | ICD-10-CM | POA: Diagnosis present

## 2015-01-22 LAB — CBC WITH DIFFERENTIAL/PLATELET
BASOS PCT: 0 %
Basophils Absolute: 0 10*3/uL (ref 0.0–0.1)
EOS ABS: 0.1 10*3/uL (ref 0.0–0.7)
Eosinophils Relative: 1 %
HCT: 38.4 % — ABNORMAL LOW (ref 39.0–52.0)
HEMOGLOBIN: 12.7 g/dL — AB (ref 13.0–17.0)
Lymphocytes Relative: 22 %
Lymphs Abs: 2.7 10*3/uL (ref 0.7–4.0)
MCH: 30.7 pg (ref 26.0–34.0)
MCHC: 33.1 g/dL (ref 30.0–36.0)
MCV: 92.8 fL (ref 78.0–100.0)
MONOS PCT: 7 %
Monocytes Absolute: 0.8 10*3/uL (ref 0.1–1.0)
NEUTROS PCT: 70 %
Neutro Abs: 8.5 10*3/uL — ABNORMAL HIGH (ref 1.7–7.7)
PLATELETS: 247 10*3/uL (ref 150–400)
RBC: 4.14 MIL/uL — ABNORMAL LOW (ref 4.22–5.81)
RDW: 15.7 % — AB (ref 11.5–15.5)
WBC: 12.1 10*3/uL — AB (ref 4.0–10.5)

## 2015-01-22 LAB — URINALYSIS, ROUTINE W REFLEX MICROSCOPIC
BILIRUBIN URINE: NEGATIVE
Glucose, UA: NEGATIVE mg/dL
KETONES UR: NEGATIVE mg/dL
NITRITE: POSITIVE — AB
Protein, ur: 100 mg/dL — AB
Specific Gravity, Urine: 1.016 (ref 1.005–1.030)
UROBILINOGEN UA: 0.2 mg/dL (ref 0.0–1.0)
pH: 8.5 — ABNORMAL HIGH (ref 5.0–8.0)

## 2015-01-22 LAB — BASIC METABOLIC PANEL
Anion gap: 10 (ref 5–15)
BUN: 30 mg/dL — ABNORMAL HIGH (ref 6–20)
CALCIUM: 9.3 mg/dL (ref 8.9–10.3)
CO2: 25 mmol/L (ref 22–32)
CREATININE: 1.2 mg/dL (ref 0.61–1.24)
Chloride: 103 mmol/L (ref 101–111)
GFR calc non Af Amer: 53 mL/min — ABNORMAL LOW (ref >60)
Glucose, Bld: 99 mg/dL (ref 65–99)
Potassium: 4.2 mmol/L (ref 3.5–5.1)
SODIUM: 138 mmol/L (ref 135–145)

## 2015-01-22 LAB — URINE MICROSCOPIC-ADD ON

## 2015-01-22 LAB — PROTIME-INR
INR: 2.41 — AB (ref 0.00–1.49)
PROTHROMBIN TIME: 26 s — AB (ref 11.6–15.2)

## 2015-01-22 MED ORDER — CEFTRIAXONE SODIUM 1 G IJ SOLR
1.0000 g | Freq: Once | INTRAMUSCULAR | Status: AC
  Administered 2015-01-22: 1 g via INTRAMUSCULAR
  Filled 2015-01-22: qty 10

## 2015-01-22 MED ORDER — LIDOCAINE HCL (PF) 1 % IJ SOLN
2.0000 mL | Freq: Once | INTRAMUSCULAR | Status: AC
  Administered 2015-01-22: 2 mL
  Filled 2015-01-22: qty 5

## 2015-01-22 MED ORDER — DEXTROSE 5 % IV SOLN: 1.0000 g | Freq: Once | INTRAVENOUS | Status: DC

## 2015-01-22 MED ORDER — CEPHALEXIN 500 MG PO CAPS: 500.0000 mg | ORAL_CAPSULE | Freq: Two times a day (BID) | ORAL | Status: DC

## 2015-01-22 NOTE — ED Notes (Addendum)
Patient has an indwelling catheter. Patient states his urine output has decreased and he is having lower abdominal pain/burning.

## 2015-01-22 NOTE — Discharge Instructions (Signed)
Read the information below.  Use the prescribed medication as directed.  Please discuss all new medications with your pharmacist.  You may return to the Emergency Department at any time for worsening condition or any new symptoms that concern you.  If you develop high fevers, worsening abdominal pain, uncontrolled vomiting, if your foley catheter is not draining, or are unable to tolerate fluids by mouth, return to the ER for a recheck.     Urinary Tract Infection Urinary tract infections (UTIs) can develop anywhere along your urinary tract. Your urinary tract is your body's drainage system for removing wastes and extra water. Your urinary tract includes two kidneys, two ureters, a bladder, and a urethra. Your kidneys are a pair of bean-shaped organs. Each kidney is about the size of your fist. They are located below your ribs, one on each side of your spine. CAUSES Infections are caused by microbes, which are microscopic organisms, including fungi, viruses, and bacteria. These organisms are so small that they can only be seen through a microscope. Bacteria are the microbes that most commonly cause UTIs. SYMPTOMS  Symptoms of UTIs may vary by age and gender of the patient and by the location of the infection. Symptoms in young women typically include a frequent and intense urge to urinate and a painful, burning feeling in the bladder or urethra during urination. Older women and men are more likely to be tired, shaky, and weak and have muscle aches and abdominal pain. A fever may mean the infection is in your kidneys. Other symptoms of a kidney infection include pain in your back or sides below the ribs, nausea, and vomiting. DIAGNOSIS To diagnose a UTI, your caregiver will ask you about your symptoms. Your caregiver also will ask to provide a urine sample. The urine sample will be tested for bacteria and white blood cells. White blood cells are made by your body to help fight infection. TREATMENT    Typically, UTIs can be treated with medication. Because most UTIs are caused by a bacterial infection, they usually can be treated with the use of antibiotics. The choice of antibiotic and length of treatment depend on your symptoms and the type of bacteria causing your infection. HOME CARE INSTRUCTIONS  If you were prescribed antibiotics, take them exactly as your caregiver instructs you. Finish the medication even if you feel better after you have only taken some of the medication.  Drink enough water and fluids to keep your urine clear or pale yellow.  Avoid caffeine, tea, and carbonated beverages. They tend to irritate your bladder.  Empty your bladder often. Avoid holding urine for long periods of time.  Empty your bladder before and after sexual intercourse.  After a bowel movement, women should cleanse from front to back. Use each tissue only once. SEEK MEDICAL CARE IF:   You have back pain.  You develop a fever.  Your symptoms do not begin to resolve within 3 days. SEEK IMMEDIATE MEDICAL CARE IF:   You have severe back pain or lower abdominal pain.  You develop chills.  You have nausea or vomiting.  You have continued burning or discomfort with urination. MAKE SURE YOU:   Understand these instructions.  Will watch your condition.  Will get help right away if you are not doing well or get worse. Document Released: 01/23/2005 Document Revised: 10/15/2011 Document Reviewed: 05/24/2011 [facility] Patient Information 2015 [place], [place]. This information is not intended to replace advice given to you by your health care provider. Make  sure you discuss any questions you have with your health care provider.  Catheter-Associated Urinary Tract Infection FAQs WHAT IS "CATHETER-ASSOCIATED" URINARY TRACT INFECTION? A urinary tract infection (also called "UTI") is an infection in the urinary system, which includes the bladder (which stores the urine) and the kidneys (which  filter the blood to make urine). Germs (for example, bacteria or yeasts) do not normally live in these areas; but if germs are introduced, an infection can occur. If you have a urinary catheter, germs can travel along the catheter and cause an infection in your bladder or your kidney; in that case it is called a catheter-associated urinary tract infection (or "CA-UTI").  WHAT IS A URINARY CATHETER? A urinary catheter is a thin tube placed in the bladder to drain urine. Urine drains through the tube into a bag that collects the urine. A urinary  catheter may be used:  If you are not able to urinate on your own.  To measure the amount of urine that you make, for example, during intensive care.  During and after some types of surgery.  During some tests of the kidneys and bladder . People with urinary catheters have a much higher chance of getting a urinary tract infection than people who don't have a catheter. HOW DO I GET A CATHETER-ASSOCIATED URINARY TRACT INFECTION (CA-UTI)? If germs enter the urinary tract, they may cause an infection. Many of the germs that cause a catheter-associated urinary tract infection are common germs found in your intestines that do not usually cause an infection there. Germs can enter the urinary tract when the catheter is being put in or while the catheter remains in the bladder.  WHAT ARE THE SYMPTOMS OF A URINARY TRACT INFECTION?  Some of the common symptoms of a urinary tract infection are:  Burning or pain in the lower abdomen (that is, below the stomach).  Fever.  Bloody urine may be a sign of infection, but is also caused by other problems .  Burning during urination or an increase in the frequency of urination after the catheter is removed. Sometimes people with catheter-associated urinary tract infections do not have these symptoms of infection. CAN CATHETER-ASSOCIATED URINARY TRACT INFECTIONS BE TREATED? Yes, most catheter-associated urinary tract  infections can be treated with antibiotics and removal or change of the catheter. Your doctor will determine which antibiotic is best for you.  WHAT ARE SOME OF THE THINGS THAT HOSPITALS ARE DOING TO PREVENT CATHETER-ASSOCIATED URINARY TRACT INFECTIONS? To prevent urinary tract infections, doctors and nurses take the following actions.  Catheter insertion  Catheters are put in only when necessary and they are removed as soon as possible.  Only properly trained persons insert catheters using sterile ("clean") technique.  The skin in the area where the catheter will be inserted is cleaned before inserting the catheter.  Other methods to drain the urine are sometimes used, such as:  External catheters in men (these look like condoms and are placed over the penis rather than into the penis)  Putting a temporary catheter in to drain the urine and removing it right away. This is called intermittent urethral catheterization. Catheter care  Healthcare providers clean their hands by washing them with soap and water or using an alcohol-based hand rub before and after touching your catheter.  If you do not see your providers clean their hands, please ask them to do so.  Avoid disconnecting the catheter and drain tube. This helps to prevent germs from getting into the catheter  tube.  The catheter is secured to the leg to prevent pulling on the catheter.  Avoid twisting or kinking the catheter.  Keep the bag lower than the bladder to prevent urine from backflowing to the bladder.  Empty the bag regularly. The drainage spout should not touch anything while emptying the bag. WHAT CAN I DO TO HELP PREVENT CATHETER-ASSOCIATED URINARY TRACT INFECTIONS IF I HAVE A CATHETER?  Always clean your hands before and after doing catheter care.  Always keep your urine bag below the level of your bladder.  Do not tug or pull on the tubing.  Do not twist or kink the catheter tubing.  Ask your healthcare  provider each day if you still need the catheter. WHAT DO I NEED TO DO WHEN I GO HOME FROM THE HOSPITAL?  If you will be going home with a catheter, your doctor or nurse should explain everything you need to know about taking care of the catheter. Make sure you understand how to care for it before you leave the hospital.  If you develop any of the symptoms of a urinary tract infection, such as burning or pain in the lower abdomen, fever, or an increase in the frequency of urination, contact your doctor or nurse immediately.  Before you go home, make sure you know who to contact if you have questions or problems after you get home. If you have questions, please ask your doctor or nurse. Developed and co-sponsored by [employer] for [employer] [place] [phone]); Infectious Diseases Society of America (IDSA); [facility]; Association for Professionals in Infection Control and Epidemiology (APIC); Center for Disease Control (CDC); and The Joint Commission Document Released: 01/08/2012 Document Reviewed: 01/08/2012 [facility] Patient Information 2015 [place], [place]. This information is not intended to replace advice given to you by your health care provider. Make sure you discuss any questions you have with your health care provider.

## 2015-01-22 NOTE — ED Notes (Signed)
Bladder scan read 600->968ml. Irving Burton PA made aware.

## 2015-01-22 NOTE — ED Provider Notes (Signed)
CSN: [id number]     Arrival date & time 01/22/15  0827 History   First MD Initiated Contact with Patient 01/22/15 0830     Chief Complaint  Patient presents with  . Abdominal Pain  . Urinary Retention     (Consider location/radiation/quality/duration/timing/severity/associated sxs/prior Treatment) The history is provided by the patient and a relative.     Pt with hx COPD, Afib on coumadin, currently being evaluated for urinary retention of unknown etiology with indwelling foley catheter p/w urinary retention with abdominal pain.  The symptoms began this morning.  States he usually fills his leg bag every morning upon standing, this morning had very little output.  Around this time he also developed diffuse abdominal pain and burning in his penis.  Had two soft bowel movements this morning, which did not help.  Denies fevers, myalgias, N/V.  Has been seeing urologist this month with multiple tests, most recently 5 days ago with voiding trial failed.  Was placed on abx 1 month ago when initial retention occurred.    Past Medical History  Diagnosis Date  . PAF (paroxysmal atrial fibrillation)     Managed with rate control and coumadin  . COPD (chronic obstructive pulmonary disease)   . HTN (hypertension)   . Hyperlipemia   . OA (osteoarthritis)   . Chronic anticoagulation   . Incomplete rotator cuff tear or rupture of right shoulder, not specified as traumatic 2012  . Chronic anticoagulation   . Nocturia   . Osteoarthritis of right hip 03/31/2012  . Shortness of breath    Past Surgical History  Procedure Laterality Date  . Total hip arthroplasty  1998  . Shoulder arthroscopy  1994    RIGHT  . Cholecystectomy    . Appendectomy    . Cataract extraction w/ intraocular lens  implant, bilateral    . Right knee arthroscopy    . Total hip arthroplasty  03/31/2012    RIGHT HIP  . Total hip arthroplasty  03/31/2012    Procedure: TOTAL HIP ARTHROPLASTY;  Surgeon: Eulas Post, MD;   Location: [facility] OR;  Service: Orthopedics;  Laterality: Right;   Family History  Problem Relation Age of Onset  . Cancer Father   . Cancer Mother   . Other Brother     SUICIDE   Social History  Substance Use Topics  . Smoking status: Current Some Day Smoker -- 44 years    Types: Pipe  . Smokeless tobacco: Never Used     Comment: Smoked cigarettes early age socially  . Alcohol Use: No    Review of Systems  All other systems reviewed and are negative.     Allergies  Advair diskus; Dilaudid; and Morphine and related  Home Medications   Prior to Admission medications   Medication Sig Start Date End Date Taking? Authorizing Provider  Alfuzosin HCl (UROXATRAL PO) Take 10 mg by mouth 2 (two) times daily.     Historical Provider, MD  atenolol (TENORMIN) 50 MG tablet Take 0.5 tablets (25 mg total) by mouth daily. 09/25/10   Rosalio Macadamia, NP  atorvastatin (LIPITOR) 10 MG tablet Take 10 mg by mouth daily.      Historical Provider, MD  ciprofloxacin (CIPRO) 250 MG tablet Take 250 mg by mouth 2 (two) times daily.    Historical Provider, MD  digoxin (LANOXIN) 0.125 MG tablet Take 1 tablet (125 mcg total) by mouth daily. 09/27/10 12/01/14  Roger Shelter, MD  feeding supplement, ENSURE COMPLETE, (ENSURE COMPLETE) LIQD Take  237 mLs by mouth 2 (two) times daily between meals. 05/20/14   Leroy Sea, MD  HYDROcodone-acetaminophen (NORCO/VICODIN) 5-325 MG per tablet Take 1 tablet by mouth every 6 (six) hours as needed for moderate pain. 05/20/14   Leroy Sea, MD  tiotropium (SPIRIVA) 18 MCG inhalation capsule Place 18 mcg into inhaler and inhale daily.    Historical Provider, MD  warfarin (COUMADIN) 5 MG tablet Take 2.5-5 mg by mouth daily with breakfast. 2.5mg  on Wednesday,  all other days    Historical Provider, MD   BP 113/69 mmHg  Pulse 72  Temp(Src) 98.2 F (36.8 C) (Oral)  Resp 18  Ht 6' (1.829 m)  Wt 135 lb (61.236 kg)  BMI 18.31 kg/m2  SpO2 95% Physical Exam   Constitutional: He appears well-developed and well-nourished. No distress.  HENT:  Head: Normocephalic and atraumatic.  Neck: Neck supple.  Cardiovascular: Normal rate and regular rhythm.   Pulmonary/Chest: Effort normal and breath sounds normal. No respiratory distress. He has no wheezes. He has no rales.  Abdominal: Soft. He exhibits no distension and no mass. There is no tenderness. There is no rebound and no guarding.  Genitourinary: Testes normal and penis normal. Right testis shows no mass, no swelling and no tenderness. Right testis is descended. Left testis shows no mass, no swelling and no tenderness. Left testis is descended. No penile tenderness.  Foley catheter in place draining yellow cloudy urine to leg bag.    Neurological: He is alert. He exhibits normal muscle tone.  Skin: He is not diaphoretic.  Nursing note and vitals reviewed.   ED Course  Procedures (including critical care time) Labs Review Labs Reviewed  URINALYSIS, ROUTINE W REFLEX MICROSCOPIC (NOT AT [facility]) - Abnormal; Notable for the following:    APPearance TURBID (*)    pH 8.5 (*)    Hgb urine dipstick MODERATE (*)    Protein, ur 100 (*)    Nitrite POSITIVE (*)    Leukocytes, UA MODERATE (*)    All other components within normal limits  BASIC METABOLIC PANEL - Abnormal; Notable for the following:    BUN 30 (*)    GFR calc non Af Amer 53 (*)    All other components within normal limits  CBC WITH DIFFERENTIAL/PLATELET - Abnormal; Notable for the following:    WBC 12.1 (*)    RBC 4.14 (*)    Hemoglobin 12.7 (*)    HCT 38.4 (*)    RDW 15.7 (*)    Neutro Abs 8.5 (*)    All other components within normal limits  PROTIME-INR - Abnormal; Notable for the following:    Prothrombin Time 26.0 (*)    INR 2.41 (*)    All other components within normal limits  URINE MICROSCOPIC-ADD ON - Abnormal; Notable for the following:    Bacteria, UA MANY (*)    All other components within normal limits  URINE CULTURE     Imaging Review No results found. I have personally reviewed and evaluated these images and lab results as part of my medical decision-making.   EKG Interpretation None       9:25 AM Discussed pt with Dr Criss Alvine who will also see and evaluate the patient, agrees with initial plan of bladder scan, foley replaced, UA collected, labs, reassess.    Pt with 600-900 cc in bladder with old foley in place.   10:46 AM Pt reports nearly complete relief of pain, mild discomfort in suprapubic region.  Abdomen soft,  nondistended, very minimal suprapubic tenderness, no guarding, no rebound.   MDM   Final diagnoses:  UTI (lower urinary tract infection)  Foley catheter problem, initial encounter    Afebrile, nontoxic patient with one day of decreased UOP through foley and lower abdominal pain.  Bladder scan shows 600-900cc in bladder, new foley placed and bladder drained, relieved patient's abdominal pain.  Abdominal exam is benign.  UA appears infected - rocephin given in ED.  Culture pending.  Labs at baseline.  D/C home with keflex, PCP/urology follow up.  Discussed result, findings, treatment, and follow up  with patient.  Pt given return precautions.  Pt verbalizes understanding and agrees with plan.        Trixie Dredge, PA-C 01/22/15 1534  Pricilla Loveless, MD 01/24/15 [phone]

## 2015-01-22 NOTE — ED Notes (Signed)
Pt discharged with leg bag attatched and secured. Pt instructed on care and maintenance of bag and catheter. Pt has scheduled urology followup.

## 2015-01-24 ENCOUNTER — Telehealth: Payer: Self-pay | Admitting: *Deleted

## 2015-01-24 LAB — URINE CULTURE

## 2015-01-24 NOTE — Telephone Encounter (Signed)
Dr. Waynard Edwards office called to inform us that the patient was prescribed Bactrim DS by his Dermatologist on today.  Called the patient and he stated he was on Keflex for a UTI and has since been taken off the Keflex and was prescribed Bactrim but is unsure of dose or who to take the medication because he som has his prescription but stated he thinks it is for 7 days.  Advised that the medication can interfere with Coumadin and he verbalized understanding.  He stated he will start the Bactrim on Thursday 01/26/15.  Appointment has been set for 01/30/15 for INR check.

## 2015-01-25 ENCOUNTER — Telehealth (HOSPITAL_BASED_OUTPATIENT_CLINIC_OR_DEPARTMENT_OTHER): Payer: Self-pay | Admitting: Emergency Medicine

## 2015-01-25 NOTE — Telephone Encounter (Signed)
Post ED Visit - Positive Culture Follow-up  Culture report reviewed by antimicrobial stewardship pharmacist:   Celedonio Miyamoto, Pharm.D., BCPS  Georgina Pillion, Pharm.D., BCPS  [place], [place].D., BCPS, AAHIVP  Estella Husk, Pharm.D., BCPS, AAHIVP  [place], [address].D.  Tennis Must, Pharm.D.  Positive urine culture Proteus Treated with cephalexin, organism sensitive to the same and no further patient follow-up is required at this time.  Berle Mull 01/25/2015, 10:48 AM

## 2015-01-30 ENCOUNTER — Ambulatory Visit (INDEPENDENT_AMBULATORY_CARE_PROVIDER_SITE_OTHER): Payer: Medicare Other | Admitting: *Deleted

## 2015-01-30 DIAGNOSIS — I4891 Unspecified atrial fibrillation: Secondary | ICD-10-CM | POA: Diagnosis not present

## 2015-01-30 LAB — POCT INR: INR: 4.1

## 2015-02-06 ENCOUNTER — Ambulatory Visit (INDEPENDENT_AMBULATORY_CARE_PROVIDER_SITE_OTHER): Payer: Medicare Other

## 2015-02-06 DIAGNOSIS — I4891 Unspecified atrial fibrillation: Secondary | ICD-10-CM

## 2015-02-06 LAB — POCT INR: INR: 2.1

## 2015-02-20 ENCOUNTER — Ambulatory Visit (INDEPENDENT_AMBULATORY_CARE_PROVIDER_SITE_OTHER): Payer: Medicare Other | Admitting: *Deleted

## 2015-02-20 DIAGNOSIS — I4891 Unspecified atrial fibrillation: Secondary | ICD-10-CM

## 2015-02-20 LAB — POCT INR: INR: 2.2

## 2015-02-22 ENCOUNTER — Telehealth: Payer: Self-pay | Admitting: Pharmacist

## 2015-02-22 NOTE — Telephone Encounter (Signed)
Pt's son called to report pt will be restarting Bactrim DS x 7 days tomorrow.  Pt was on bactrim earlier this month.  His INR increased to 4.1 but after dose was adjusted, was therapeutic at 2.1.  Will have patient adjust to the dose used earlier this month- 5mg  daily except 2.5mg  on Tuesday, Thursday and Sunday for 1 week then resume 5mg  daily.  Will keep normal follow up as we have recent INR results on this dose and antibiotic to know that it will be okay.

## 2015-03-20 ENCOUNTER — Ambulatory Visit (INDEPENDENT_AMBULATORY_CARE_PROVIDER_SITE_OTHER): Payer: Medicare Other | Admitting: *Deleted

## 2015-03-20 DIAGNOSIS — I4891 Unspecified atrial fibrillation: Secondary | ICD-10-CM

## 2015-03-20 LAB — POCT INR: INR: 2.4

## 2015-04-28 ENCOUNTER — Ambulatory Visit (INDEPENDENT_AMBULATORY_CARE_PROVIDER_SITE_OTHER): Payer: Medicare Other | Admitting: *Deleted

## 2015-04-28 DIAGNOSIS — I4891 Unspecified atrial fibrillation: Secondary | ICD-10-CM | POA: Diagnosis not present

## 2015-04-28 LAB — POCT INR: INR: 2.4

## 2015-05-17 ENCOUNTER — Telehealth: Payer: Self-pay | Admitting: Pharmacist

## 2015-05-17 DIAGNOSIS — I480 Paroxysmal atrial fibrillation: Secondary | ICD-10-CM

## 2015-05-17 DIAGNOSIS — Z7901 Long term (current) use of anticoagulants: Secondary | ICD-10-CM

## 2015-05-17 MED ORDER — WARFARIN SODIUM 5 MG PO TABS: 5.0000 mg | ORAL_TABLET | Freq: Every day | ORAL | Status: DC

## 2015-05-17 NOTE — Telephone Encounter (Signed)
Nash Dimmer, Nurse from Primary care office called about dose of warfarin as the patient needs refills. Informed her we could send refills for patient. She states he needs them sent to [facility] mail order and a 30 day supply sent to [facility] on [address]. Refills sent.

## 2015-06-09 ENCOUNTER — Ambulatory Visit (INDEPENDENT_AMBULATORY_CARE_PROVIDER_SITE_OTHER): Payer: Medicare Other | Admitting: *Deleted

## 2015-06-09 DIAGNOSIS — I4891 Unspecified atrial fibrillation: Secondary | ICD-10-CM

## 2015-06-09 LAB — POCT INR: INR: 3.5

## 2015-06-23 ENCOUNTER — Ambulatory Visit (INDEPENDENT_AMBULATORY_CARE_PROVIDER_SITE_OTHER): Payer: Medicare Other | Admitting: *Deleted

## 2015-06-23 DIAGNOSIS — I4891 Unspecified atrial fibrillation: Secondary | ICD-10-CM

## 2015-06-23 LAB — POCT INR: INR: 3

## 2015-07-14 ENCOUNTER — Ambulatory Visit (INDEPENDENT_AMBULATORY_CARE_PROVIDER_SITE_OTHER): Payer: Medicare Other | Admitting: *Deleted

## 2015-07-14 DIAGNOSIS — I4891 Unspecified atrial fibrillation: Secondary | ICD-10-CM | POA: Diagnosis not present

## 2015-07-14 LAB — POCT INR: INR: 2.9

## 2015-07-17 ENCOUNTER — Ambulatory Visit (INDEPENDENT_AMBULATORY_CARE_PROVIDER_SITE_OTHER): Payer: Medicare Other | Admitting: *Deleted

## 2015-07-17 DIAGNOSIS — I4891 Unspecified atrial fibrillation: Secondary | ICD-10-CM | POA: Diagnosis not present

## 2015-07-17 LAB — POCT INR: INR: 2.6

## 2015-07-24 ENCOUNTER — Ambulatory Visit (INDEPENDENT_AMBULATORY_CARE_PROVIDER_SITE_OTHER): Payer: Medicare Other | Admitting: *Deleted

## 2015-07-24 DIAGNOSIS — I4891 Unspecified atrial fibrillation: Secondary | ICD-10-CM

## 2015-07-24 LAB — POCT INR: INR: 3.5

## 2015-07-28 ENCOUNTER — Encounter (HOSPITAL_COMMUNITY): Payer: Self-pay | Admitting: Emergency Medicine

## 2015-07-28 ENCOUNTER — Emergency Department (HOSPITAL_COMMUNITY): Payer: Medicare Other

## 2015-07-28 ENCOUNTER — Other Ambulatory Visit: Payer: Self-pay

## 2015-07-28 ENCOUNTER — Inpatient Hospital Stay (HOSPITAL_COMMUNITY)
Admission: EM | Admit: 2015-07-28 | Discharge: 2015-08-03 | DRG: 083 | Disposition: A | Discharge status: Skilled Nursing Facility | Payer: Medicare Other | Attending: General Surgery | Admitting: General Surgery

## 2015-07-28 DIAGNOSIS — R739 Hyperglycemia, unspecified: Secondary | ICD-10-CM | POA: Diagnosis present

## 2015-07-28 DIAGNOSIS — S022XXA Fracture of nasal bones, initial encounter for closed fracture: Secondary | ICD-10-CM | POA: Diagnosis present

## 2015-07-28 DIAGNOSIS — D689 Coagulation defect, unspecified: Secondary | ICD-10-CM | POA: Diagnosis not present

## 2015-07-28 DIAGNOSIS — J449 Chronic obstructive pulmonary disease, unspecified: Secondary | ICD-10-CM | POA: Diagnosis present

## 2015-07-28 DIAGNOSIS — W109XXA Fall (on) (from) unspecified stairs and steps, initial encounter: Secondary | ICD-10-CM | POA: Diagnosis present

## 2015-07-28 DIAGNOSIS — I10 Essential (primary) hypertension: Secondary | ICD-10-CM | POA: Diagnosis present

## 2015-07-28 DIAGNOSIS — I4891 Unspecified atrial fibrillation: Secondary | ICD-10-CM

## 2015-07-28 DIAGNOSIS — I609 Nontraumatic subarachnoid hemorrhage, unspecified: Secondary | ICD-10-CM

## 2015-07-28 DIAGNOSIS — D6832 Hemorrhagic disorder due to extrinsic circulating anticoagulants: Secondary | ICD-10-CM | POA: Diagnosis present

## 2015-07-28 DIAGNOSIS — T1490XA Injury, unspecified, initial encounter: Secondary | ICD-10-CM

## 2015-07-28 DIAGNOSIS — S020XXA Fracture of vault of skull, initial encounter for closed fracture: Secondary | ICD-10-CM | POA: Diagnosis present

## 2015-07-28 DIAGNOSIS — Z96641 Presence of right artificial hip joint: Secondary | ICD-10-CM | POA: Diagnosis present

## 2015-07-28 DIAGNOSIS — F17200 Nicotine dependence, unspecified, uncomplicated: Secondary | ICD-10-CM | POA: Diagnosis present

## 2015-07-28 DIAGNOSIS — S0231XA Fracture of orbital floor, right side, initial encounter for closed fracture: Secondary | ICD-10-CM | POA: Diagnosis present

## 2015-07-28 DIAGNOSIS — Z885 Allergy status to narcotic agent status: Secondary | ICD-10-CM

## 2015-07-28 DIAGNOSIS — I481 Persistent atrial fibrillation: Secondary | ICD-10-CM | POA: Diagnosis not present

## 2015-07-28 DIAGNOSIS — S066X9A Traumatic subarachnoid hemorrhage with loss of consciousness of unspecified duration, initial encounter: Secondary | ICD-10-CM | POA: Diagnosis present

## 2015-07-28 DIAGNOSIS — S0240FA Zygomatic fracture, left side, initial encounter for closed fracture: Secondary | ICD-10-CM | POA: Diagnosis present

## 2015-07-28 DIAGNOSIS — Z79899 Other long term (current) drug therapy: Secondary | ICD-10-CM

## 2015-07-28 DIAGNOSIS — I482 Chronic atrial fibrillation: Secondary | ICD-10-CM | POA: Diagnosis present

## 2015-07-28 DIAGNOSIS — E785 Hyperlipidemia, unspecified: Secondary | ICD-10-CM | POA: Diagnosis present

## 2015-07-28 DIAGNOSIS — S12400A Unspecified displaced fracture of fifth cervical vertebra, initial encounter for closed fracture: Secondary | ICD-10-CM | POA: Diagnosis present

## 2015-07-28 DIAGNOSIS — S069XAA Unspecified intracranial injury with loss of consciousness status unknown, initial encounter: Secondary | ICD-10-CM | POA: Diagnosis present

## 2015-07-28 DIAGNOSIS — Z888 Allergy status to other drugs, medicaments and biological substances status: Secondary | ICD-10-CM | POA: Diagnosis not present

## 2015-07-28 DIAGNOSIS — S129XXA Fracture of neck, unspecified, initial encounter: Secondary | ICD-10-CM | POA: Diagnosis present

## 2015-07-28 DIAGNOSIS — S062X0A Diffuse traumatic brain injury without loss of consciousness, initial encounter: Secondary | ICD-10-CM

## 2015-07-28 DIAGNOSIS — S0292XA Unspecified fracture of facial bones, initial encounter for closed fracture: Secondary | ICD-10-CM | POA: Diagnosis present

## 2015-07-28 DIAGNOSIS — W19XXXA Unspecified fall, initial encounter: Secondary | ICD-10-CM | POA: Diagnosis present

## 2015-07-28 DIAGNOSIS — I480 Paroxysmal atrial fibrillation: Secondary | ICD-10-CM | POA: Diagnosis present

## 2015-07-28 DIAGNOSIS — S069X9A Unspecified intracranial injury with loss of consciousness of unspecified duration, initial encounter: Secondary | ICD-10-CM | POA: Diagnosis present

## 2015-07-28 DIAGNOSIS — N390 Urinary tract infection, site not specified: Secondary | ICD-10-CM | POA: Diagnosis present

## 2015-07-28 DIAGNOSIS — Z7901 Long term (current) use of anticoagulants: Secondary | ICD-10-CM

## 2015-07-28 LAB — I-STAT TROPONIN, ED: Troponin i, poc: 0 ng/mL (ref 0.00–0.08)

## 2015-07-28 LAB — PROTIME-INR
INR: 1.92 — AB (ref 0.00–1.49)
INR: 1.28 (ref 0.00–1.49)
INR: 1.35 (ref 0.00–1.49)
PROTHROMBIN TIME: 21.9 s — AB (ref 11.6–15.2)
PROTHROMBIN TIME: 16.2 s — AB (ref 11.6–15.2)
PROTHROMBIN TIME: 16.8 s — AB (ref 11.6–15.2)

## 2015-07-28 LAB — COMPREHENSIVE METABOLIC PANEL
ALK PHOS: 52 U/L (ref 38–126)
ALT: 17 U/L (ref 17–63)
AST: 23 U/L (ref 15–41)
Albumin: 3.8 g/dL (ref 3.5–5.0)
Anion gap: 12 (ref 5–15)
BILIRUBIN TOTAL: 0.4 mg/dL (ref 0.3–1.2)
BUN: 29 mg/dL — AB (ref 6–20)
CALCIUM: 8.9 mg/dL (ref 8.9–10.3)
CO2: 19 mmol/L — AB (ref 22–32)
CREATININE: 1.33 mg/dL — AB (ref 0.61–1.24)
Chloride: 103 mmol/L (ref 101–111)
GFR calc Af Amer: 54 mL/min — ABNORMAL LOW (ref >60)
GFR calc non Af Amer: 47 mL/min — ABNORMAL LOW (ref >60)
GLUCOSE: 132 mg/dL — AB (ref 65–99)
Potassium: 4.5 mmol/L (ref 3.5–5.1)
SODIUM: 134 mmol/L — AB (ref 135–145)
Total Protein: 6.4 g/dL — ABNORMAL LOW (ref 6.5–8.1)

## 2015-07-28 LAB — APTT: aPTT: 25 seconds (ref 24–37)

## 2015-07-28 LAB — CBC
HCT: 35.8 % — ABNORMAL LOW (ref 39.0–52.0)
Hemoglobin: 12.2 g/dL — ABNORMAL LOW (ref 13.0–17.0)
MCH: 31.5 pg (ref 26.0–34.0)
MCHC: 34.1 g/dL (ref 30.0–36.0)
MCV: 92.5 fL (ref 78.0–100.0)
PLATELETS: 230 10*3/uL (ref 150–400)
RBC: 3.87 MIL/uL — AB (ref 4.22–5.81)
RDW: 14.8 % (ref 11.5–15.5)
WBC: 12.8 10*3/uL — ABNORMAL HIGH (ref 4.0–10.5)

## 2015-07-28 LAB — DIFFERENTIAL
BASOS PCT: 0 %
Basophils Absolute: 0 10*3/uL (ref 0.0–0.1)
EOS ABS: 0.1 10*3/uL (ref 0.0–0.7)
Eosinophils Relative: 1 %
LYMPHS ABS: 4.2 10*3/uL — AB (ref 0.7–4.0)
LYMPHS PCT: 33 %
Monocytes Absolute: 0.9 10*3/uL (ref 0.1–1.0)
Monocytes Relative: 7 %
NEUTROS ABS: 7.6 10*3/uL (ref 1.7–7.7)
NEUTROS PCT: 59 %

## 2015-07-28 LAB — I-STAT CHEM 8, ED
BUN: 32 mg/dL — AB (ref 6–20)
CALCIUM ION: 1.06 mmol/L — AB (ref 1.13–1.30)
CHLORIDE: 101 mmol/L (ref 101–111)
Creatinine, Ser: 1.1 mg/dL (ref 0.61–1.24)
GLUCOSE: 135 mg/dL — AB (ref 65–99)
HCT: 40 % (ref 39.0–52.0)
Hemoglobin: 13.6 g/dL (ref 13.0–17.0)
Potassium: 4.4 mmol/L (ref 3.5–5.1)
Sodium: 135 mmol/L (ref 135–145)
TCO2: 22 mmol/L (ref 0–100)

## 2015-07-28 LAB — ETHANOL

## 2015-07-28 LAB — CBG MONITORING, ED: Glucose-Capillary: 132 mg/dL — ABNORMAL HIGH (ref 65–99)

## 2015-07-28 LAB — TROPONIN I

## 2015-07-28 MED ORDER — ALFUZOSIN HCL ER 10 MG PO TB24
10.0000 mg | ORAL_TABLET | Freq: Two times a day (BID) | ORAL | Status: DC
  Administered 2015-07-29 – 2015-08-03 (×8): 10 mg via ORAL
  Filled 2015-07-28 (×18): qty 1

## 2015-07-28 MED ORDER — TETANUS-DIPHTHERIA TOXOIDS TD 5-2 LFU IM INJ
0.5000 mL | INJECTION | Freq: Once | INTRAMUSCULAR | Status: AC
  Administered 2015-07-29: 0.5 mL via INTRAMUSCULAR
  Filled 2015-07-28: qty 0.5

## 2015-07-28 MED ORDER — BACITRACIN-NEOMYCIN-POLYMYXIN OINTMENT TUBE
TOPICAL_OINTMENT | Freq: Three times a day (TID) | CUTANEOUS | Status: DC
  Administered 2015-07-28: 1 via TOPICAL
  Administered 2015-07-29: 22:00:00 via TOPICAL
  Administered 2015-07-29 – 2015-07-30 (×5): 1 via TOPICAL
  Administered 2015-07-31 – 2015-08-02 (×8): via TOPICAL
  Administered 2015-08-02: 1 via TOPICAL
  Administered 2015-08-03 (×2): via TOPICAL
  Filled 2015-07-28: qty 15

## 2015-07-28 MED ORDER — TRIAMTERENE-HCTZ 37.5-25 MG PO TABS: 0.5000 | ORAL_TABLET | Freq: Every day | ORAL | Status: DC

## 2015-07-28 MED ORDER — PROTHROMBIN COMPLEX CONC HUMAN 500 UNITS IV KIT
1563.0000 [IU] | PACK | Status: AC
  Administered 2015-07-28: 1563 [IU] via INTRAVENOUS
  Filled 2015-07-28: qty 63

## 2015-07-28 MED ORDER — SODIUM CHLORIDE 0.9 % IV SOLN
500.0000 mg | Freq: Two times a day (BID) | INTRAVENOUS | Status: DC
  Administered 2015-07-28 – 2015-08-01 (×7): 500 mg via INTRAVENOUS
  Filled 2015-07-28 (×10): qty 5

## 2015-07-28 MED ORDER — METOPROLOL TARTRATE 1 MG/ML IV SOLN: 2.5000 mg | INTRAVENOUS | Status: DC | PRN

## 2015-07-28 MED ORDER — FENTANYL CITRATE (PF) 100 MCG/2ML IJ SOLN
75.0000 ug | Freq: Once | INTRAMUSCULAR | Status: AC
  Administered 2015-07-28: 75 ug via INTRAVENOUS
  Filled 2015-07-28: qty 2

## 2015-07-28 MED ORDER — ONDANSETRON HCL 4 MG PO TABS: 4.0000 mg | ORAL_TABLET | Freq: Four times a day (QID) | ORAL | Status: DC | PRN

## 2015-07-28 MED ORDER — VITAMIN K1 10 MG/ML IJ SOLN
10.0000 mg | INTRAVENOUS | Status: AC
  Administered 2015-07-28: 10 mg via INTRAVENOUS
  Filled 2015-07-28: qty 1

## 2015-07-28 MED ORDER — FENTANYL CITRATE (PF) 100 MCG/2ML IJ SOLN
50.0000 ug | INTRAMUSCULAR | Status: DC | PRN
  Administered 2015-07-28 – 2015-07-31 (×7): 50 ug via INTRAVENOUS
  Filled 2015-07-28 (×7): qty 2

## 2015-07-28 MED ORDER — ATENOLOL 25 MG PO TABS
25.0000 mg | ORAL_TABLET | Freq: Every day | ORAL | Status: DC
  Administered 2015-07-31 – 2015-08-01 (×2): 25 mg via ORAL
  Filled 2015-07-28 (×2): qty 1

## 2015-07-28 MED ORDER — ATORVASTATIN CALCIUM 10 MG PO TABS
10.0000 mg | ORAL_TABLET | Freq: Every day | ORAL | Status: DC
  Administered 2015-07-31 – 2015-08-03 (×4): 10 mg via ORAL
  Filled 2015-07-28 (×4): qty 1

## 2015-07-28 MED ORDER — TIOTROPIUM BROMIDE MONOHYDRATE 18 MCG IN CAPS
18.0000 ug | ORAL_CAPSULE | Freq: Every day | RESPIRATORY_TRACT | Status: DC
  Administered 2015-07-29 – 2015-08-03 (×4): 18 ug via RESPIRATORY_TRACT
  Filled 2015-07-28: qty 5

## 2015-07-28 MED ORDER — ONDANSETRON HCL 4 MG/2ML IJ SOLN
4.0000 mg | Freq: Four times a day (QID) | INTRAMUSCULAR | Status: DC | PRN
  Administered 2015-07-28 – 2015-07-29 (×3): 4 mg via INTRAVENOUS
  Filled 2015-07-28 (×3): qty 2

## 2015-07-28 MED ORDER — KCL IN DEXTROSE-NACL 20-5-0.9 MEQ/L-%-% IV SOLN
INTRAVENOUS | Status: DC
  Administered 2015-07-28: 21:00:00 via INTRAVENOUS
  Administered 2015-07-29 – 2015-07-30 (×2): 50 mL/h via INTRAVENOUS
  Administered 2015-07-31: 06:00:00 via INTRAVENOUS
  Filled 2015-07-28 (×5): qty 1000

## 2015-07-28 MED ORDER — PROTHROMBIN COMPLEX CONC HUMAN 500 UNITS IV KIT
25.0000 [IU]/kg | PACK | INTRAVENOUS | Status: DC
  Filled 2015-07-28: qty 68

## 2015-07-28 MED ORDER — PANTOPRAZOLE SODIUM 40 MG PO TBEC
40.0000 mg | DELAYED_RELEASE_TABLET | Freq: Every day | ORAL | Status: DC
  Filled 2015-07-28: qty 1

## 2015-07-28 MED ORDER — SODIUM CHLORIDE 0.9 % IV SOLN: Freq: Once | INTRAVENOUS | Status: DC

## 2015-07-28 MED ORDER — PANTOPRAZOLE SODIUM 40 MG IV SOLR
40.0000 mg | Freq: Every day | INTRAVENOUS | Status: DC
  Administered 2015-07-28 – 2015-07-31 (×4): 40 mg via INTRAVENOUS
  Filled 2015-07-28 (×4): qty 40

## 2015-07-28 NOTE — ED Notes (Signed)
Trauma MD at bedside.

## 2015-07-28 NOTE — Consult Note (Addendum)
PULMONARY / CRITICAL CARE MEDICINE   Name: Kyle [name] MRN: [id number] DOB: Nov 23, 1926    ADMISSION DATE:  07/28/2015  CONSULTATION DATE:  07/28/15  REFERRING MD:  Trauma  CHIEF COMPLAINT:  82 year old with paroxysmal A. Fib on Coumadin, COPD, hypertension, hyperlipidemia, osteoarthritis.admitted after being found down in his house. Patient apparently fell off the steps onto his face.found to have facial, skull fractures, C5 fracture, subarachnoid and intraparenchymal hemorrhage. PCCM called for medical management.  Patient was also admitted in January, 2017 after a fall with a left hip fracture  PAST MEDICAL HISTORY :  He  has a past medical history of PAF (paroxysmal atrial fibrillation) (HCC); COPD (chronic obstructive pulmonary disease) (HCC); HTN (hypertension); Hyperlipemia; OA (osteoarthritis); Chronic anticoagulation; Incomplete rotator cuff tear or rupture of right shoulder, not specified as traumatic (2012); Chronic anticoagulation; Nocturia; Osteoarthritis of right hip (03/31/2012); and Shortness of breath.  PAST SURGICAL HISTORY: He  has past surgical history that includes Total hip arthroplasty (1998); Shoulder arthroscopy (1994); Cholecystectomy; Appendectomy; Cataract extraction w/ intraocular lens  implant, bilateral; right knee arthroscopy; Total hip arthroplasty (03/31/2012); and Total hip arthroplasty (03/31/2012).  Allergies  Allergen Reactions  . Advair Diskus [Fluticasone-Salmeterol] Other (See Comments)  . Dilaudid [Hydromorphone Hcl] Other (See Comments)    Makes patient very loopy and disoriented  . Morphine And Related Other (See Comments)    No current facility-administered medications on file prior to encounter.   Current Outpatient Prescriptions on File Prior to Encounter  Medication Sig  . Alfuzosin HCl (UROXATRAL PO) Take 10 mg by mouth 2 (two) times daily.   Marland Kitchen atenolol (TENORMIN) 50 MG tablet Take 0.5 tablets (25 mg total) by mouth daily.  Marland Kitchen atorvastatin  (LIPITOR) 10 MG tablet Take 10 mg by mouth daily.    . cephALEXin (KEFLEX) 500 MG capsule Take 1 capsule (500 mg total) by mouth 2 (two) times daily.  . digoxin (LANOXIN) 0.125 MG tablet Take 1 tablet (125 mcg total) by mouth daily.  . feeding supplement, ENSURE COMPLETE, (ENSURE COMPLETE) LIQD Take 237 mLs by mouth 2 (two) times daily between meals. (Patient taking differently: Take 237 mLs by mouth every morning. )  . HYDROcodone-acetaminophen (NORCO/VICODIN) 5-325 MG per tablet Take 1 tablet by mouth every 6 (six) hours as needed for moderate pain.  . magnesium hydroxide (MILK OF MAGNESIA) 400 MG/5ML suspension Take 5 mLs by mouth daily as needed for mild constipation.  Marland Kitchen tiotropium (SPIRIVA) 18 MCG inhalation capsule Place 18 mcg into inhaler and inhale daily.  Marland Kitchen triamterene-hydrochlorothiazide (MAXZIDE-25) 37.5-25 MG per tablet Take 0.5 tablets by mouth daily.  Marland Kitchen warfarin (COUMADIN) 5 MG tablet Take 1 tablet (5 mg total) by mouth daily with breakfast.    FAMILY HISTORY:  His indicated that his mother is deceased. He indicated that his father is deceased. He indicated that his brother is deceased.   SOCIAL HISTORY: He  reports that he has been smoking Pipe.  He has never used smokeless tobacco. He reports that he does not drink alcohol or use illicit drugs.  REVIEW OF SYSTEMS:   Complains of pain in the face. Denies any chest pain, palpitations. Denies any nausea, vomiting, diarrhea, constipation. Denies any wheezing, dyspnea, cough, sputum production. All other review of systems is negative  SUBJECTIVE:   VITAL SIGNS: BP 126/80 mmHg  Pulse 72  Temp(Src) 97.7 F (36.5 C) (Oral)  Resp 23  Wt 149 lb 4 oz (67.7 kg)  SpO2 92%  HEMODYNAMICS:    VENTILATOR SETTINGS:  INTAKE / OUTPUT:    PHYSICAL EXAMINATION: General:  Somnolent, arousable, face swelling, multiple face deformities, hematoma, periorbital swelling Neuro:  Moves all 4 extremities, no gross focal  deficit HEENT:  No JVD, thyromegaly Cardiovascular:  irregular, no murmurs rubs gallops Lungs:  Clear, no wheeze, crackle Abdomen:  Soft , positive bowel sounds Musculoskeletal:  Normal tone and bulk, no edema  LABS:  BMET  Recent Labs Lab 07/28/15 1700 07/28/15 1706  NA 134* 135  K 4.5 4.4  CL 103 101  CO2 19*  --   BUN 29* 32*  CREATININE 1.33* 1.10  GLUCOSE 132* 135*    Electrolytes  Recent Labs Lab 07/28/15 1700  CALCIUM 8.9    CBC  Recent Labs Lab 07/28/15 1700 07/28/15 1706  WBC 12.8*  --   HGB 12.2* 13.6  HCT 35.8* 40.0  PLT 230  --     Coag's  Recent Labs Lab 07/24/15 0805 07/28/15 1700 07/28/15 1920  APTT  --  25  --   INR 3.5 1.92* 1.28    Sepsis Markers No results for input(s): LATICACIDVEN, PROCALCITON, O2SATVEN in the last 168 hours.  ABG No results for input(s): PHART, PCO2ART, PO2ART in the last 168 hours.  Liver Enzymes  Recent Labs Lab 07/28/15 1700  AST 23  ALT 17  ALKPHOS 52  BILITOT 0.4  ALBUMIN 3.8    Cardiac Enzymes No results for input(s): TROPONINI, PROBNP in the last 168 hours.  Glucose  Recent Labs Lab 07/28/15 1717  GLUCAP 132*    Imaging Ct Head Wo Contrast  07/28/2015  CLINICAL DATA:  Fall. EXAM: CT HEAD WITHOUT CONTRAST CT MAXILLOFACIAL WITHOUT CONTRAST CT CERVICAL SPINE WITHOUT CONTRAST TECHNIQUE: Multidetector CT imaging of the head, cervical spine, and maxillofacial structures were performed using the standard protocol without intravenous contrast. Multiplanar CT image reconstructions of the cervical spine and maxillofacial structures were also generated. COMPARISON:  CT scan of head of February 09, 2013. FINDINGS: CT HEAD FINDINGS Mildly depressed and comminuted frontal skull fracture is noted. Mild bilateral frontal subarachnoid hemorrhage is noted. Mild diffuse cortical atrophy is noted. Mild chronic ischemic white matter disease is noted. Ventricular size is within normal limits. No midline shift  is noted. Possible 1 cm left frontal intraparenchymal hemorrhage is noted. CT MAXILLOFACIAL FINDINGS The mandible appears normal moderately depressed left nasal bone fracture is noted. Mildly displaced left zygomatic arch fracture is noted, with mildly displaced left posterior maxillary wall fracture. Left pterygoid plate appears normal. Moderately displaced and comminuted fracture is seen involving right pterygoid plate with associated fracture of the posterior wall of the right maxillary sinus. Minimally displaced fracture is seen involving the right lamina papyracea. Moderately displaced right anterior maxillary wall fracture is noted. Hemorrhage is noted in both maxillary sinuses as well as probably in the ethmoid and sphenoid sinuses. Fractures are seen involving the lateral orbital walls bilaterally. Mildly displaced fractures are seen involving the superior orbital roofs bilaterally. Mildly displaced right orbital floor fracture is noted. Large amount of soft tissue gas is seen in the soft tissues lateral to the right maxillary sinus. CT CERVICAL SPINE FINDINGS No significant spondylolisthesis is noted. Mildly displaced fracture is seen involving the posterior spinous process of C5. Mild degenerative disc disease is noted at C3-4, C5-6 and C6-7. Visualized lung apices are unremarkable. IMPRESSION: Mild diffuse cortical atrophy. Mild chronic ischemic white matter disease. Mild bilateral frontal subarachnoid hemorrhage is noted. Probable 1 cm left frontal intraparenchymal hemorrhage is noted. No midline shift is noted.  Small amount of pneumocephalus is noted. Mildly depressed and comminuted bilateral frontal skull fracture is noted. Moderately depressed left nasal bone fracture. Mildly displaced left zygomatic arch fracture. Mildly displaced left posterior maxillary wall fracture. Moderately displaced and comminuted fracture is seen involving the posterior wall of the right maxillary sinus and the right  pterygoid plate. Hemorrhage is noted in both maxillary, ethmoid and sphenoid sinuses. Minimally displaced fracture seen involving the right lamina papyracea. Moderately displaced right anterior maxillary wall fracture is noted. Mildly displaced fractures are seen involving the superior orbital roofs bilaterally. Mildly displaced right orbital floor fracture is noted. Mildly displaced bilateral lateral orbital wall fractures. Mildly displaced fracture is seen involving posterior spinous process of C5. Multilevel degenerative disc disease is noted in the cervical spine. No significant spondylolisthesis is noted. Critical Value/emergent results were called by telephone at the time of interpretation on 07/28/2015 at 5:43 pm to Dr. Bary Castilla , who verbally acknowledged these results. Electronically Signed   By: Lupita Raider, M.D.   On: 07/28/2015 17:44   Ct Cervical Spine Wo Contrast  07/28/2015  CLINICAL DATA:  Fall. EXAM: CT HEAD WITHOUT CONTRAST CT MAXILLOFACIAL WITHOUT CONTRAST CT CERVICAL SPINE WITHOUT CONTRAST TECHNIQUE: Multidetector CT imaging of the head, cervical spine, and maxillofacial structures were performed using the standard protocol without intravenous contrast. Multiplanar CT image reconstructions of the cervical spine and maxillofacial structures were also generated. COMPARISON:  CT scan of head of February 09, 2013. FINDINGS: CT HEAD FINDINGS Mildly depressed and comminuted frontal skull fracture is noted. Mild bilateral frontal subarachnoid hemorrhage is noted. Mild diffuse cortical atrophy is noted. Mild chronic ischemic white matter disease is noted. Ventricular size is within normal limits. No midline shift is noted. Possible 1 cm left frontal intraparenchymal hemorrhage is noted. CT MAXILLOFACIAL FINDINGS The mandible appears normal moderately depressed left nasal bone fracture is noted. Mildly displaced left zygomatic arch fracture is noted, with mildly displaced left posterior  maxillary wall fracture. Left pterygoid plate appears normal. Moderately displaced and comminuted fracture is seen involving right pterygoid plate with associated fracture of the posterior wall of the right maxillary sinus. Minimally displaced fracture is seen involving the right lamina papyracea. Moderately displaced right anterior maxillary wall fracture is noted. Hemorrhage is noted in both maxillary sinuses as well as probably in the ethmoid and sphenoid sinuses. Fractures are seen involving the lateral orbital walls bilaterally. Mildly displaced fractures are seen involving the superior orbital roofs bilaterally. Mildly displaced right orbital floor fracture is noted. Large amount of soft tissue gas is seen in the soft tissues lateral to the right maxillary sinus. CT CERVICAL SPINE FINDINGS No significant spondylolisthesis is noted. Mildly displaced fracture is seen involving the posterior spinous process of C5. Mild degenerative disc disease is noted at C3-4, C5-6 and C6-7. Visualized lung apices are unremarkable. IMPRESSION: Mild diffuse cortical atrophy. Mild chronic ischemic white matter disease. Mild bilateral frontal subarachnoid hemorrhage is noted. Probable 1 cm left frontal intraparenchymal hemorrhage is noted. No midline shift is noted. Small amount of pneumocephalus is noted. Mildly depressed and comminuted bilateral frontal skull fracture is noted. Moderately depressed left nasal bone fracture. Mildly displaced left zygomatic arch fracture. Mildly displaced left posterior maxillary wall fracture. Moderately displaced and comminuted fracture is seen involving the posterior wall of the right maxillary sinus and the right pterygoid plate. Hemorrhage is noted in both maxillary, ethmoid and sphenoid sinuses. Minimally displaced fracture seen involving the right lamina papyracea. Moderately displaced right anterior maxillary wall fracture is  noted. Mildly displaced fractures are seen involving the  superior orbital roofs bilaterally. Mildly displaced right orbital floor fracture is noted. Mildly displaced bilateral lateral orbital wall fractures. Mildly displaced fracture is seen involving posterior spinous process of C5. Multilevel degenerative disc disease is noted in the cervical spine. No significant spondylolisthesis is noted. Critical Value/emergent results were called by telephone at the time of interpretation on 07/28/2015 at 5:43 pm to Dr. Bary CastillaOURTENEY MACKUEN , who verbally acknowledged these results. Electronically Signed   By: Lupita RaiderJames  Green Jr, M.D.   On: 07/28/2015 17:44   Dg Hand 2 View Left  07/28/2015  CLINICAL DATA:  Larey SeatFell today, pain at MCP joints, bruising EXAM: LEFT HAND - 2 VIEW COMPARISON:  LEFT wrist radiographs 05/19/2014 FINDINGS: Diffuse osseous demineralization. Scattered degenerative changes of interphalangeal joints. Osteolysis versus resection of trapezium. Degenerative changes first and second MCP joints. No acute fracture, dislocation, or bone destruction. Soft tissue swelling at distal phalanx index finger. Corticated ossicle noted dorsal to mid carpals appear present since 2016. Additional osseous density dorsal to the distal carpal row, seen only on lateral view, was not definitely visualized previously and is indeterminate in age. IMPRESSION: Osseous demineralization with scattered degenerative changes as above. Additional calcific density is seen dorsal to the distal carpal row on lateral view new since prior exam, age-indeterminate ; correlation for pain/tenderness at this site recommended to exclude acute fracture. Electronically Signed   By: Ulyses SouthwardMark  Boles M.D.   On: 07/28/2015 18:39   Dg Chest Portable 1 View  07/28/2015  CLINICAL DATA:  82 year old male with fall. EXAM: PORTABLE CHEST 1 VIEW COMPARISON:  07/28/2015 and prior radiographs FINDINGS: The cardiomediastinal silhouette is unremarkable. Right lower lung atelectasis versus airspace disease noted. There is no  evidence of pulmonary edema, suspicious pulmonary nodule/mass, pleural effusion, or pneumothorax. No acute bony abnormalities are identified. IMPRESSION: Right lower lung atelectasis versus airspace disease. No evidence of pneumothorax. Electronically Signed   By: Harmon PierJeffrey  Hu M.D.   On: 07/28/2015 18:32   Dg Chest Portable 1 View  07/28/2015  CLINICAL DATA:  Fall EXAM: PORTABLE CHEST 1 VIEW COMPARISON:  05/18/2014 chest radiograph. FINDINGS: A portion of the costophrenic angles is excluded from the image. Stable cardiomediastinal silhouette with normal heart size. There is a nonspecific lucency at the right lung base, cannot exclude a small basilar right pneumothorax. No left pneumothorax. No pleural effusion. Hyperinflated lungs. Emphysema. No pulmonary edema. Mild curvilinear opacities at the right lung base. Stable small granuloma in the left mid lung. No displaced fractures in the visualized chest, noting limited visualization of the right lower ribs due to overlying wires. IMPRESSION: 1. Nonspecific lucency at the right lung base, cannot exclude a small basilar right pneumothorax. Consider further evaluation with repeat upright PA and lateral chest radiographs and/or chest CT as clinically warranted . 2. Hyperinflated lungs and emphysema, suggesting COPD. Mild curvilinear opacity at the right lung base, probably atelectasis or scarring. These results were called by telephone at the time of interpretation on 07/28/2015 at 5:55 pm to Dr. Bary CastillaOURTENEY MACKUEN , who verbally acknowledged these results. Electronically Signed   By: Delbert PhenixJason A Poff M.D.   On: 07/28/2015 17:56   Ct Maxillofacial Wo Cm  07/28/2015  CLINICAL DATA:  Fall. EXAM: CT HEAD WITHOUT CONTRAST CT MAXILLOFACIAL WITHOUT CONTRAST CT CERVICAL SPINE WITHOUT CONTRAST TECHNIQUE: Multidetector CT imaging of the head, cervical spine, and maxillofacial structures were performed using the standard protocol without intravenous contrast. Multiplanar CT image  reconstructions of the cervical spine  and maxillofacial structures were also generated. COMPARISON:  CT scan of head of February 09, 2013. FINDINGS: CT HEAD FINDINGS Mildly depressed and comminuted frontal skull fracture is noted. Mild bilateral frontal subarachnoid hemorrhage is noted. Mild diffuse cortical atrophy is noted. Mild chronic ischemic white matter disease is noted. Ventricular size is within normal limits. No midline shift is noted. Possible 1 cm left frontal intraparenchymal hemorrhage is noted. CT MAXILLOFACIAL FINDINGS The mandible appears normal moderately depressed left nasal bone fracture is noted. Mildly displaced left zygomatic arch fracture is noted, with mildly displaced left posterior maxillary wall fracture. Left pterygoid plate appears normal. Moderately displaced and comminuted fracture is seen involving right pterygoid plate with associated fracture of the posterior wall of the right maxillary sinus. Minimally displaced fracture is seen involving the right lamina papyracea. Moderately displaced right anterior maxillary wall fracture is noted. Hemorrhage is noted in both maxillary sinuses as well as probably in the ethmoid and sphenoid sinuses. Fractures are seen involving the lateral orbital walls bilaterally. Mildly displaced fractures are seen involving the superior orbital roofs bilaterally. Mildly displaced right orbital floor fracture is noted. Large amount of soft tissue gas is seen in the soft tissues lateral to the right maxillary sinus. CT CERVICAL SPINE FINDINGS No significant spondylolisthesis is noted. Mildly displaced fracture is seen involving the posterior spinous process of C5. Mild degenerative disc disease is noted at C3-4, C5-6 and C6-7. Visualized lung apices are unremarkable. IMPRESSION: Mild diffuse cortical atrophy. Mild chronic ischemic white matter disease. Mild bilateral frontal subarachnoid hemorrhage is noted. Probable 1 cm left frontal intraparenchymal  hemorrhage is noted. No midline shift is noted. Small amount of pneumocephalus is noted. Mildly depressed and comminuted bilateral frontal skull fracture is noted. Moderately depressed left nasal bone fracture. Mildly displaced left zygomatic arch fracture. Mildly displaced left posterior maxillary wall fracture. Moderately displaced and comminuted fracture is seen involving the posterior wall of the right maxillary sinus and the right pterygoid plate. Hemorrhage is noted in both maxillary, ethmoid and sphenoid sinuses. Minimally displaced fracture seen involving the right lamina papyracea. Moderately displaced right anterior maxillary wall fracture is noted. Mildly displaced fractures are seen involving the superior orbital roofs bilaterally. Mildly displaced right orbital floor fracture is noted. Mildly displaced bilateral lateral orbital wall fractures. Mildly displaced fracture is seen involving posterior spinous process of C5. Multilevel degenerative disc disease is noted in the cervical spine. No significant spondylolisthesis is noted. Critical Value/emergent results were called by telephone at the time of interpretation on 07/28/2015 at 5:43 pm to Dr. Bary Castilla , who verbally acknowledged these results. Electronically Signed   By: Lupita Raider, M.D.   On: 07/28/2015 17:44    STUDIES:  CT head 3/31 > facial, skull fractures, C5 fracture, subarachnoid and intraparenchymal hemorrhage. CXR 3/31 > Rt basal opacity  CULTURES:  ANTIBIOTICS:  SIGNIFICANT EVENTS: 3/31 > Admit after fall.  LINES/TUBES:  DISCUSSION: 82 year old with atrial fibrillation on Coumadin admitted with a fall. Found to have multiple facial, skull fractures with, to brain injury and intracranial hemorrhage. INR was elevated on admission but reversed after Kcentra and Vitk. Pt is stable and protecting the airway. He will likely need to be off coumadin permanently as he has recurrent falls.   PLAN: - Monitor  respiratory status - No need for further blood products as INR has normalized - Hold Coumadin - Afib is rate controlled. Use lopressor IV as needed. Holding atenolol and Dig as pt is NPO - PRN fentanyl for pain  Chilton Greathouse MD [facility] Pager [phone] If no answer or after 3pm call: [phone] 07/28/2015, 8:55 PM

## 2015-07-28 NOTE — H&P (Signed)
History   Kyle [name] is an 82 y.o. male.   Chief Complaint:  Chief Complaint  Patient presents with  . Fall    Fall Associated symptoms include headaches and neck pain. Pertinent negatives include no abdominal pain, chest pain, chills or fever.  Asked to see patient after fall from level ground. Patient states he tripped and struck his space. Questionable loss of consciousness. He was evaluated by the emergency room physician and the trauma service was called. He is on chronic Coumadin for atrial fibrillation. He complains of face and neck pain. Denies chest, back, abdominal pain or extremity pain except for his left hand. CT scan was done which shows a frontal bone fracture, subarachnoid hemorrhage, multiple facial fractures, transverse process of C5, and significant bruising of the face. He denies shortness of breath.  Past Medical History  Diagnosis Date  . PAF (paroxysmal atrial fibrillation) ([place])     Managed with rate control and coumadin  . COPD (chronic obstructive pulmonary disease) ([place])   . HTN (hypertension)   . Hyperlipemia   . OA (osteoarthritis)   . Chronic anticoagulation   . Incomplete rotator cuff tear or rupture of right shoulder, not specified as traumatic 2012  . Chronic anticoagulation   . Nocturia   . Osteoarthritis of right hip 03/31/2012  . Shortness of breath     Past Surgical History  Procedure Laterality Date  . Total hip arthroplasty  1998  . Shoulder arthroscopy  1994    RIGHT  . Cholecystectomy    . Appendectomy    . Cataract extraction w/ intraocular lens  implant, bilateral    . Right knee arthroscopy    . Total hip arthroplasty  03/31/2012    RIGHT HIP  . Total hip arthroplasty  03/31/2012    Procedure: TOTAL HIP ARTHROPLASTY;  Surgeon: Johnny Bridge, MD;  Location: [place];  Service: Orthopedics;  Laterality: Right;    Family History  Problem Relation Age of Onset  . Cancer Father   . Cancer Mother   . Other Brother     SUICIDE    Social History:  reports that he has been smoking Pipe.  He has never used smokeless tobacco. He reports that he does not drink alcohol or use illicit drugs.  Allergies   Allergies  Allergen Reactions  . Advair Diskus [Fluticasone-Salmeterol] Other (See Comments)  . Dilaudid [Hydromorphone Hcl] Other (See Comments)    Makes patient very loopy and disoriented  . Morphine And Related Other (See Comments)    Home Medications   (Not in a hospital admission)  Trauma Course   Results for orders placed or performed during the hospital encounter of 07/28/15 (from the past 48 hour(s))  Protime-INR     Status: Abnormal   Collection Time: 07/28/15  5:00 PM  Result Value Ref Range   Prothrombin Time 21.9 (H) 11.6 - 15.2 seconds   INR 1.92 (H) 0.00 - 1.49  APTT     Status: None   Collection Time: 07/28/15  5:00 PM  Result Value Ref Range   aPTT 25 24 - 37 seconds  CBC     Status: Abnormal   Collection Time: 07/28/15  5:00 PM  Result Value Ref Range   WBC 12.8 (H) 4.0 - 10.5 K/uL   RBC 3.87 (L) 4.22 - 5.81 MIL/uL   Hemoglobin 12.2 (L) 13.0 - 17.0 g/dL   HCT 35.8 (L) 39.0 - 52.0 %   MCV 92.5 78.0 - 100.0 fL  MCH 31.5 26.0 - 34.0 pg   MCHC 34.1 30.0 - 36.0 g/dL   RDW 14.8 11.5 - 15.5 %   Platelets 230 150 - 400 K/uL  Differential     Status: Abnormal   Collection Time: 07/28/15  5:00 PM  Result Value Ref Range   Neutrophils Relative % 59 %   Lymphocytes Relative 33 %   Monocytes Relative 7 %   Eosinophils Relative 1 %   Basophils Relative 0 %   Neutro Abs 7.6 1.7 - 7.7 K/uL   Lymphs Abs 4.2 (H) 0.7 - 4.0 K/uL   Monocytes Absolute 0.9 0.1 - 1.0 K/uL   Eosinophils Absolute 0.1 0.0 - 0.7 K/uL   Basophils Absolute 0.0 0.0 - 0.1 K/uL   RBC Morphology ELLIPTOCYTES     Comment: BURR CELLS  Comprehensive metabolic panel     Status: Abnormal   Collection Time: 07/28/15  5:00 PM  Result Value Ref Range   Sodium 134 (L) 135 - 145 mmol/L   Potassium 4.5 3.5 - 5.1 mmol/L    Chloride 103 101 - 111 mmol/L   CO2 19 (L) 22 - 32 mmol/L   Glucose, Bld 132 (H) 65 - 99 mg/dL   BUN 29 (H) 6 - 20 mg/dL   Creatinine, Ser 1.33 (H) 0.61 - 1.24 mg/dL   Calcium 8.9 8.9 - 10.3 mg/dL   Total Protein 6.4 (L) 6.5 - 8.1 g/dL   Albumin 3.8 3.5 - 5.0 g/dL   AST 23 15 - 41 U/L   ALT 17 17 - 63 U/L   Alkaline Phosphatase 52 38 - 126 U/L   Total Bilirubin 0.4 0.3 - 1.2 mg/dL   GFR calc non Af Amer 47 (L) >60 mL/min   GFR calc Af Amer 54 (L) >60 mL/min    Comment: (NOTE) The eGFR has been calculated using the CKD EPI equation. This calculation has not been validated in all clinical situations. eGFR's persistently <60 mL/min signify possible Chronic Kidney Disease.    Anion gap 12 5 - 15  I-stat troponin, ED (not at [facility], [facility])     Status: None   Collection Time: 07/28/15  5:05 PM  Result Value Ref Range   Troponin i, poc 0.00 0.00 - 0.08 ng/mL   Comment 3            Comment: Due to the release kinetics of cTnI, a negative result within the first hours of the onset of symptoms does not rule out myocardial infarction with certainty. If myocardial infarction is still suspected, repeat the test at appropriate intervals.   I-Stat Chem 8, ED  (not at [facility], [facility])     Status: Abnormal   Collection Time: 07/28/15  5:06 PM  Result Value Ref Range   Sodium 135 135 - 145 mmol/L   Potassium 4.4 3.5 - 5.1 mmol/L   Chloride 101 101 - 111 mmol/L   BUN 32 (H) 6 - 20 mg/dL   Creatinine, Ser 1.10 0.61 - 1.24 mg/dL   Glucose, Bld 135 (H) 65 - 99 mg/dL   Calcium, Ion 1.06 (L) 1.13 - 1.30 mmol/L   TCO2 22 0 - 100 mmol/L   Hemoglobin 13.6 13.0 - 17.0 g/dL   HCT 40.0 39.0 - 52.0 %  CBG monitoring, ED     Status: Abnormal   Collection Time: 07/28/15  5:17 PM  Result Value Ref Range   Glucose-Capillary 132 (H) 65 - 99 mg/dL   Ct Head Wo Contrast  07/28/2015  CLINICAL DATA:  Fall. EXAM: CT HEAD WITHOUT CONTRAST CT MAXILLOFACIAL WITHOUT CONTRAST CT CERVICAL SPINE WITHOUT CONTRAST  TECHNIQUE: Multidetector CT imaging of the head, cervical spine, and maxillofacial structures were performed using the standard protocol without intravenous contrast. Multiplanar CT image reconstructions of the cervical spine and maxillofacial structures were also generated. COMPARISON:  CT scan of head of February 09, 2013. FINDINGS: CT HEAD FINDINGS Mildly depressed and comminuted frontal skull fracture is noted. Mild bilateral frontal subarachnoid hemorrhage is noted. Mild diffuse cortical atrophy is noted. Mild chronic ischemic white matter disease is noted. Ventricular size is within normal limits. No midline shift is noted. Possible 1 cm left frontal intraparenchymal hemorrhage is noted. CT MAXILLOFACIAL FINDINGS The mandible appears normal moderately depressed left nasal bone fracture is noted. Mildly displaced left zygomatic arch fracture is noted, with mildly displaced left posterior maxillary wall fracture. Left pterygoid plate appears normal. Moderately displaced and comminuted fracture is seen involving right pterygoid plate with associated fracture of the posterior wall of the right maxillary sinus. Minimally displaced fracture is seen involving the right lamina papyracea. Moderately displaced right anterior maxillary wall fracture is noted. Hemorrhage is noted in both maxillary sinuses as well as probably in the ethmoid and sphenoid sinuses. Fractures are seen involving the lateral orbital walls bilaterally. Mildly displaced fractures are seen involving the superior orbital roofs bilaterally. Mildly displaced right orbital floor fracture is noted. Large amount of soft tissue gas is seen in the soft tissues lateral to the right maxillary sinus. CT CERVICAL SPINE FINDINGS No significant spondylolisthesis is noted. Mildly displaced fracture is seen involving the posterior spinous process of C5. Mild degenerative disc disease is noted at C3-4, C5-6 and C6-7. Visualized lung apices are unremarkable.  IMPRESSION: Mild diffuse cortical atrophy. Mild chronic ischemic white matter disease. Mild bilateral frontal subarachnoid hemorrhage is noted. Probable 1 cm left frontal intraparenchymal hemorrhage is noted. No midline shift is noted. Small amount of pneumocephalus is noted. Mildly depressed and comminuted bilateral frontal skull fracture is noted. Moderately depressed left nasal bone fracture. Mildly displaced left zygomatic arch fracture. Mildly displaced left posterior maxillary wall fracture. Moderately displaced and comminuted fracture is seen involving the posterior wall of the right maxillary sinus and the right pterygoid plate. Hemorrhage is noted in both maxillary, ethmoid and sphenoid sinuses. Minimally displaced fracture seen involving the right lamina papyracea. Moderately displaced right anterior maxillary wall fracture is noted. Mildly displaced fractures are seen involving the superior orbital roofs bilaterally. Mildly displaced right orbital floor fracture is noted. Mildly displaced bilateral lateral orbital wall fractures. Mildly displaced fracture is seen involving posterior spinous process of C5. Multilevel degenerative disc disease is noted in the cervical spine. No significant spondylolisthesis is noted. Critical Value/emergent results were called by telephone at the time of interpretation on 07/28/2015 at 5:43 pm to Dr. Zenovia Jarred , who verbally acknowledged these results. Electronically Signed   By: Marijo Conception, M.D.   On: 07/28/2015 17:44   Ct Cervical Spine Wo Contrast  07/28/2015  CLINICAL DATA:  Fall. EXAM: CT HEAD WITHOUT CONTRAST CT MAXILLOFACIAL WITHOUT CONTRAST CT CERVICAL SPINE WITHOUT CONTRAST TECHNIQUE: Multidetector CT imaging of the head, cervical spine, and maxillofacial structures were performed using the standard protocol without intravenous contrast. Multiplanar CT image reconstructions of the cervical spine and maxillofacial structures were also generated.  COMPARISON:  CT scan of head of February 09, 2013. FINDINGS: CT HEAD FINDINGS Mildly depressed and comminuted frontal skull fracture is noted. Mild bilateral frontal subarachnoid hemorrhage is  noted. Mild diffuse cortical atrophy is noted. Mild chronic ischemic white matter disease is noted. Ventricular size is within normal limits. No midline shift is noted. Possible 1 cm left frontal intraparenchymal hemorrhage is noted. CT MAXILLOFACIAL FINDINGS The mandible appears normal moderately depressed left nasal bone fracture is noted. Mildly displaced left zygomatic arch fracture is noted, with mildly displaced left posterior maxillary wall fracture. Left pterygoid plate appears normal. Moderately displaced and comminuted fracture is seen involving right pterygoid plate with associated fracture of the posterior wall of the right maxillary sinus. Minimally displaced fracture is seen involving the right lamina papyracea. Moderately displaced right anterior maxillary wall fracture is noted. Hemorrhage is noted in both maxillary sinuses as well as probably in the ethmoid and sphenoid sinuses. Fractures are seen involving the lateral orbital walls bilaterally. Mildly displaced fractures are seen involving the superior orbital roofs bilaterally. Mildly displaced right orbital floor fracture is noted. Large amount of soft tissue gas is seen in the soft tissues lateral to the right maxillary sinus. CT CERVICAL SPINE FINDINGS No significant spondylolisthesis is noted. Mildly displaced fracture is seen involving the posterior spinous process of C5. Mild degenerative disc disease is noted at C3-4, C5-6 and C6-7. Visualized lung apices are unremarkable. IMPRESSION: Mild diffuse cortical atrophy. Mild chronic ischemic white matter disease. Mild bilateral frontal subarachnoid hemorrhage is noted. Probable 1 cm left frontal intraparenchymal hemorrhage is noted. No midline shift is noted. Small amount of pneumocephalus is noted. Mildly  depressed and comminuted bilateral frontal skull fracture is noted. Moderately depressed left nasal bone fracture. Mildly displaced left zygomatic arch fracture. Mildly displaced left posterior maxillary wall fracture. Moderately displaced and comminuted fracture is seen involving the posterior wall of the right maxillary sinus and the right pterygoid plate. Hemorrhage is noted in both maxillary, ethmoid and sphenoid sinuses. Minimally displaced fracture seen involving the right lamina papyracea. Moderately displaced right anterior maxillary wall fracture is noted. Mildly displaced fractures are seen involving the superior orbital roofs bilaterally. Mildly displaced right orbital floor fracture is noted. Mildly displaced bilateral lateral orbital wall fractures. Mildly displaced fracture is seen involving posterior spinous process of C5. Multilevel degenerative disc disease is noted in the cervical spine. No significant spondylolisthesis is noted. Critical Value/emergent results were called by telephone at the time of interpretation on 07/28/2015 at 5:43 pm to Dr. Zenovia Jarred , who verbally acknowledged these results. Electronically Signed   By: Marijo Conception, M.D.   On: 07/28/2015 17:44   Dg Hand 2 View Left  07/28/2015  CLINICAL DATA:  [place] today, pain at MCP joints, bruising EXAM: LEFT HAND - 2 VIEW COMPARISON:  LEFT wrist radiographs 05/19/2014 FINDINGS: Diffuse osseous demineralization. Scattered degenerative changes of interphalangeal joints. Osteolysis versus resection of trapezium. Degenerative changes first and second MCP joints. No acute fracture, dislocation, or bone destruction. Soft tissue swelling at distal phalanx index finger. Corticated ossicle noted dorsal to mid carpals appear present since 2016. Additional osseous density dorsal to the distal carpal row, seen only on lateral view, was not definitely visualized previously and is indeterminate in age. IMPRESSION: Osseous demineralization  with scattered degenerative changes as above. Additional calcific density is seen dorsal to the distal carpal row on lateral view new since prior exam, age-indeterminate ; correlation for pain/tenderness at this site recommended to exclude acute fracture. Electronically Signed   By: Lavonia Dana M.D.   On: 07/28/2015 18:39   Dg Chest Portable 1 View  07/28/2015  CLINICAL DATA:  82 year old male with fall.  EXAM: PORTABLE CHEST 1 VIEW COMPARISON:  07/28/2015 and prior radiographs FINDINGS: The cardiomediastinal silhouette is unremarkable. Right lower lung atelectasis versus airspace disease noted. There is no evidence of pulmonary edema, suspicious pulmonary nodule/mass, pleural effusion, or pneumothorax. No acute bony abnormalities are identified. IMPRESSION: Right lower lung atelectasis versus airspace disease. No evidence of pneumothorax. Electronically Signed   By: Margarette Canada M.D.   On: 07/28/2015 18:32   Dg Chest Portable 1 View  07/28/2015  CLINICAL DATA:  Fall EXAM: PORTABLE CHEST 1 VIEW COMPARISON:  05/18/2014 chest radiograph. FINDINGS: A portion of the costophrenic angles is excluded from the image. Stable cardiomediastinal silhouette with normal heart size. There is a nonspecific lucency at the right lung base, cannot exclude a small basilar right pneumothorax. No left pneumothorax. No pleural effusion. Hyperinflated lungs. Emphysema. No pulmonary edema. Mild curvilinear opacities at the right lung base. Stable small granuloma in the left mid lung. No displaced fractures in the visualized chest, noting limited visualization of the right lower ribs due to overlying wires. IMPRESSION: 1. Nonspecific lucency at the right lung base, cannot exclude a small basilar right pneumothorax. Consider further evaluation with repeat upright PA and lateral chest radiographs and/or chest CT as clinically warranted . 2. Hyperinflated lungs and emphysema, suggesting COPD. Mild curvilinear opacity at the right lung base,  probably atelectasis or scarring. These results were called by telephone at the time of interpretation on 07/28/2015 at 5:55 pm to Dr. Zenovia Jarred , who verbally acknowledged these results. Electronically Signed   By: Ilona Sorrel M.D.   On: 07/28/2015 17:56   Ct Maxillofacial Wo Cm  07/28/2015  CLINICAL DATA:  Fall. EXAM: CT HEAD WITHOUT CONTRAST CT MAXILLOFACIAL WITHOUT CONTRAST CT CERVICAL SPINE WITHOUT CONTRAST TECHNIQUE: Multidetector CT imaging of the head, cervical spine, and maxillofacial structures were performed using the standard protocol without intravenous contrast. Multiplanar CT image reconstructions of the cervical spine and maxillofacial structures were also generated. COMPARISON:  CT scan of head of February 09, 2013. FINDINGS: CT HEAD FINDINGS Mildly depressed and comminuted frontal skull fracture is noted. Mild bilateral frontal subarachnoid hemorrhage is noted. Mild diffuse cortical atrophy is noted. Mild chronic ischemic white matter disease is noted. Ventricular size is within normal limits. No midline shift is noted. Possible 1 cm left frontal intraparenchymal hemorrhage is noted. CT MAXILLOFACIAL FINDINGS The mandible appears normal moderately depressed left nasal bone fracture is noted. Mildly displaced left zygomatic arch fracture is noted, with mildly displaced left posterior maxillary wall fracture. Left pterygoid plate appears normal. Moderately displaced and comminuted fracture is seen involving right pterygoid plate with associated fracture of the posterior wall of the right maxillary sinus. Minimally displaced fracture is seen involving the right lamina papyracea. Moderately displaced right anterior maxillary wall fracture is noted. Hemorrhage is noted in both maxillary sinuses as well as probably in the ethmoid and sphenoid sinuses. Fractures are seen involving the lateral orbital walls bilaterally. Mildly displaced fractures are seen involving the superior orbital roofs  bilaterally. Mildly displaced right orbital floor fracture is noted. Large amount of soft tissue gas is seen in the soft tissues lateral to the right maxillary sinus. CT CERVICAL SPINE FINDINGS No significant spondylolisthesis is noted. Mildly displaced fracture is seen involving the posterior spinous process of C5. Mild degenerative disc disease is noted at C3-4, C5-6 and C6-7. Visualized lung apices are unremarkable. IMPRESSION: Mild diffuse cortical atrophy. Mild chronic ischemic white matter disease. Mild bilateral frontal subarachnoid hemorrhage is noted. Probable 1 cm left frontal  intraparenchymal hemorrhage is noted. No midline shift is noted. Small amount of pneumocephalus is noted. Mildly depressed and comminuted bilateral frontal skull fracture is noted. Moderately depressed left nasal bone fracture. Mildly displaced left zygomatic arch fracture. Mildly displaced left posterior maxillary wall fracture. Moderately displaced and comminuted fracture is seen involving the posterior wall of the right maxillary sinus and the right pterygoid plate. Hemorrhage is noted in both maxillary, ethmoid and sphenoid sinuses. Minimally displaced fracture seen involving the right lamina papyracea. Moderately displaced right anterior maxillary wall fracture is noted. Mildly displaced fractures are seen involving the superior orbital roofs bilaterally. Mildly displaced right orbital floor fracture is noted. Mildly displaced bilateral lateral orbital wall fractures. Mildly displaced fracture is seen involving posterior spinous process of C5. Multilevel degenerative disc disease is noted in the cervical spine. No significant spondylolisthesis is noted. Critical Value/emergent results were called by telephone at the time of interpretation on 07/28/2015 at 5:43 pm to Dr. Zenovia Jarred , who verbally acknowledged these results. Electronically Signed   By: Marijo Conception, M.D.   On: 07/28/2015 17:44    Review of Systems   Constitutional: Negative for fever and chills.  HENT: Positive for nosebleeds.   Respiratory: Negative for shortness of breath and wheezing.   Cardiovascular: Negative for chest pain.  Gastrointestinal: Negative for abdominal pain.  Genitourinary: Positive for frequency.  Musculoskeletal: Positive for neck pain.  Neurological: Positive for tremors, focal weakness and headaches.  Endo/Heme/Allergies: Bruises/bleeds easily.    Blood pressure 133/78, pulse 74, resp. rate 16, weight 67.7 kg (149 lb 4 oz), SpO2 92 %. Physical Exam  Constitutional: He is oriented to person, place, and time.  HENT:  Head: Head is with raccoon's eyes, with abrasion and with contusion.    Right Ear: Tympanic membrane normal.  Left Ear: Tympanic membrane normal.  Nose: Sinus tenderness and nasal deformity present. Epistaxis is observed.  Eyes: EOM are normal. Pupils are equal, round, and reactive to light. No scleral icterus.  Neck: Spinous process tenderness present.    Cardiovascular: Normal rate and normal pulses.  A regularly irregular rhythm present.  Respiratory: Effort normal and breath sounds normal. He exhibits no tenderness, no bony tenderness, no crepitus, no deformity and no retraction.  GI: Soft. Bowel sounds are normal. There is no tenderness. There is no rebound and no guarding.  Musculoskeletal:       Left hand: He exhibits tenderness. He exhibits no laceration and no swelling.       Hands: Neurological: He is alert and oriented to person, place, and time. He has normal strength. No cranial nerve deficit or sensory deficit. GCS eye subscore is 4. GCS verbal subscore is 5. GCS motor subscore is 6.  Psychiatric: He has a normal mood and affect. His speech is normal and behavior is normal.     Assessment/Plan Fall level ground  Subarachnoid hemorrhage-neurosurgery has been consulted per EDP conversation Dr. Cyndy Freeze  Frontal skull fracture-per neurosurgery  Multiple facial fractures-ENT is  been consulted by the EDP  Chronic atrial fibrillation-I have asked EDP to consult medicine for evaluation  Chronic anticoagulation-patient on Coumadin. INR is 1.9. We'll give him 1 unit of FFP given subarachnoid hemorrhage.  Tobacco abuse  Left hand pain-plain films do not show obvious fracture. May need CT scan at some point.  Admit to stepdown  C5 fracture-per neurosurgery hard collar  Jacelyn Cuen A. 07/28/2015, 7:17 PM   Procedures

## 2015-07-28 NOTE — Consult Note (Signed)
Referring Physician: Trauma team   Reason for Consultation: Atrial fibrillation  HPI: 82 year old ma with reported history of paroxysmal AF (on Coumadin for stroke prevention), admitted to trauma service after he suffered a fall (syncope?) resulting in facial fracture, frontal skull fracture, C5 fracture, subarachnoid and intraparenchymal hemorrhage. History of fall and left hip fracture in 04/2014. Given history of AF, cardiology is consulted. At present, HR is in 80's on tele. BP normal. No pauses or bradycardia.    Review of Systems:  10 systems reviewed unremarkable except as noted in HPI    Past Medical History  Diagnosis Date  . PAF (paroxysmal atrial fibrillation) (HCC)     Managed with rate control and coumadin  . COPD (chronic obstructive pulmonary disease) (HCC)   . HTN (hypertension)   . Hyperlipemia   . OA (osteoarthritis)   . Chronic anticoagulation   . Incomplete rotator cuff tear or rupture of right shoulder, not specified as traumatic 2012  . Chronic anticoagulation   . Nocturia   . Osteoarthritis of right hip 03/31/2012  . Shortness of breath     Medications Prior to Admission  Medication Sig Dispense Refill  . Alfuzosin HCl (UROXATRAL PO) Take 10 mg by mouth 2 (two) times daily.     Marland Kitchen atenolol (TENORMIN) 50 MG tablet Take 0.5 tablets (25 mg total) by mouth daily.    Marland Kitchen atorvastatin (LIPITOR) 10 MG tablet Take 10 mg by mouth daily.      . cephALEXin (KEFLEX) 500 MG capsule Take 1 capsule (500 mg total) by mouth 2 (two) times daily. 14 capsule 0  . digoxin (LANOXIN) 0.125 MG tablet Take 1 tablet (125 mcg total) by mouth daily. 90 tablet 3  . feeding supplement, ENSURE COMPLETE, (ENSURE COMPLETE) LIQD Take 237 mLs by mouth 2 (two) times daily between meals. (Patient taking differently: Take 237 mLs by mouth every morning. )    . HYDROcodone-acetaminophen (NORCO/VICODIN) 5-325 MG per tablet Take 1 tablet by mouth every 6 (six) hours as needed for moderate pain. 30  tablet 0  . magnesium hydroxide (MILK OF MAGNESIA) 400 MG/5ML suspension Take 5 mLs by mouth daily as needed for mild constipation.    Marland Kitchen tiotropium (SPIRIVA) 18 MCG inhalation capsule Place 18 mcg into inhaler and inhale daily.    Marland Kitchen triamterene-hydrochlorothiazide (MAXZIDE-25) 37.5-25 MG per tablet Take 0.5 tablets by mouth daily.    Marland Kitchen warfarin (COUMADIN) 5 MG tablet Take 1 tablet (5 mg total) by mouth daily with breakfast. 30 tablet 0     . sodium chloride   Intravenous Once  . alfuzosin  10 mg Oral BID  . atenolol  25 mg Oral Daily  . atorvastatin  10 mg Oral Daily  . levETIRAcetam  500 mg Intravenous Q12H  . neomycin-bacitracin-polymyxin   Topical TID  . pantoprazole  40 mg Oral Daily   Or  . pantoprazole (PROTONIX) IV  40 mg Intravenous Daily  . tetanus & diphtheria toxoids (adult)  0.5 mL Intramuscular Once  . tiotropium  18 mcg Inhalation Daily  . triamterene-hydrochlorothiazide  0.5 tablet Oral Daily    Infusions: . dextrose 5 % and 0.9 % NaCl with KCl 20 mEq/L 50 mL/hr at 07/28/15 2100    Allergies  Allergen Reactions  . Advair Diskus [Fluticasone-Salmeterol] Other (See Comments)  . Dilaudid [Hydromorphone Hcl] Other (See Comments)    Makes patient very loopy and disoriented  . Morphine And Related Other (See Comments)    Social History   Social  History  . Marital Status: Married    Spouse Name: N/A  . Number of Children: N/A  . Years of Education: N/A   Occupational History  . retired    Social History Main Topics  . Smoking status: Current Some Day Smoker -- 44 years    Types: Pipe  . Smokeless tobacco: Never Used     Comment: Smoked cigarettes early age socially  . Alcohol Use: No  . Drug Use: No  . Sexual Activity: No   Other Topics Concern  . Not on file   Social History Narrative    Family History  Problem Relation Age of Onset  . Cancer Father   . Cancer Mother   . Other Brother     SUICIDE    PHYSICAL EXAM: Filed Vitals:   07/28/15  2130 07/28/15 2200  BP: 126/94 127/74  Pulse: 85   Temp:    Resp: 19 20     Intake/Output Summary (Last 24 hours) at 07/28/15 2313 Last data filed at 07/28/15 2200  Gross per 24 hour  Intake     50 ml  Output    200 ml  Net   -150 ml    General:  Lethargic,  HEENT: significant facial trauma, extensive bruising of face and oozing of blood from abrasions Neck: colalr in place Cor: PMI nondisplaced. Irregular rate & rhythm. No rubs, gallops or murmurs. Lungs: clear Abdomen: soft, nontender, nondistended. No hepatosplenomegaly. No bruits or masses. Good bowel sounds. Extremities: no cyanosis, clubbing, rash, edema Neuro: oriented x 1, moves all 4 extremities  ECG: AF, v rate 102 bpm, narrow QRS   Results for orders placed or performed during the hospital encounter of 07/28/15 (from the past 24 hour(s))  Protime-INR     Status: Abnormal   Collection Time: 07/28/15  5:00 PM  Result Value Ref Range   Prothrombin Time 21.9 (H) 11.6 - 15.2 seconds   INR 1.92 (H) 0.00 - 1.49  APTT     Status: None   Collection Time: 07/28/15  5:00 PM  Result Value Ref Range   aPTT 25 24 - 37 seconds  CBC     Status: Abnormal   Collection Time: 07/28/15  5:00 PM  Result Value Ref Range   WBC 12.8 (H) 4.0 - 10.5 K/uL   RBC 3.87 (L) 4.22 - 5.81 MIL/uL   Hemoglobin 12.2 (L) 13.0 - 17.0 g/dL   HCT 16.1 (L) 09.6 - 04.5 %   MCV 92.5 78.0 - 100.0 fL   MCH 31.5 26.0 - 34.0 pg   MCHC 34.1 30.0 - 36.0 g/dL   RDW 40.9 81.1 - 91.4 %   Platelets 230 150 - 400 K/uL  Differential     Status: Abnormal   Collection Time: 07/28/15  5:00 PM  Result Value Ref Range   Neutrophils Relative % 59 %   Lymphocytes Relative 33 %   Monocytes Relative 7 %   Eosinophils Relative 1 %   Basophils Relative 0 %   Neutro Abs 7.6 1.7 - 7.7 K/uL   Lymphs Abs 4.2 (H) 0.7 - 4.0 K/uL   Monocytes Absolute 0.9 0.1 - 1.0 K/uL   Eosinophils Absolute 0.1 0.0 - 0.7 K/uL   Basophils Absolute 0.0 0.0 - 0.1 K/uL   RBC Morphology  ELLIPTOCYTES   Comprehensive metabolic panel     Status: Abnormal   Collection Time: 07/28/15  5:00 PM  Result Value Ref Range   Sodium 134 (L) 135 - 145 mmol/L  Potassium 4.5 3.5 - 5.1 mmol/L   Chloride 103 101 - 111 mmol/L   CO2 19 (L) 22 - 32 mmol/L   Glucose, Bld 132 (H) 65 - 99 mg/dL   BUN 29 (H) 6 - 20 mg/dL   Creatinine, Ser 1.61 (H) 0.61 - 1.24 mg/dL   Calcium 8.9 8.9 - 09.6 mg/dL   Total Protein 6.4 (L) 6.5 - 8.1 g/dL   Albumin 3.8 3.5 - 5.0 g/dL   AST 23 15 - 41 U/L   ALT 17 17 - 63 U/L   Alkaline Phosphatase 52 38 - 126 U/L   Total Bilirubin 0.4 0.3 - 1.2 mg/dL   GFR calc non Af Amer 47 (L) >60 mL/min   GFR calc Af Amer 54 (L) >60 mL/min   Anion gap 12 5 - 15  I-stat troponin, ED (not at [facility], [facility])     Status: None   Collection Time: 07/28/15  5:05 PM  Result Value Ref Range   Troponin i, poc 0.00 0.00 - 0.08 ng/mL   Comment 3          I-Stat Chem 8, ED  (not at [facility], [facility])     Status: Abnormal   Collection Time: 07/28/15  5:06 PM  Result Value Ref Range   Sodium 135 135 - 145 mmol/L   Potassium 4.4 3.5 - 5.1 mmol/L   Chloride 101 101 - 111 mmol/L   BUN 32 (H) 6 - 20 mg/dL   Creatinine, Ser 0.45 0.61 - 1.24 mg/dL   Glucose, Bld 409 (H) 65 - 99 mg/dL   Calcium, Ion 8.11 (L) 1.13 - 1.30 mmol/L   TCO2 22 0 - 100 mmol/L   Hemoglobin 13.6 13.0 - 17.0 g/dL   HCT 91.4 78.2 - 95.6 %  CBG monitoring, ED     Status: Abnormal   Collection Time: 07/28/15  5:17 PM  Result Value Ref Range   Glucose-Capillary 132 (H) 65 - 99 mg/dL  Ethanol     Status: None   Collection Time: 07/28/15  7:20 PM  Result Value Ref Range   Alcohol, Ethyl (B) <5 <5 mg/dL  Protime-INR     Status: Abnormal   Collection Time: 07/28/15  7:20 PM  Result Value Ref Range   Prothrombin Time 16.2 (H) 11.6 - 15.2 seconds   INR 1.28 0.00 - 1.49  Prepare fresh frozen plasma     Status: None (Preliminary result)   Collection Time: 07/28/15  7:37 PM  Result Value Ref Range   Unit Number [id number]      Blood Component Type THAWED PLASMA    Unit division 00    Status of Unit ALLOCATED    Transfusion Status OK TO TRANSFUSE   Protime-INR     Status: Abnormal   Collection Time: 07/28/15  8:28 PM  Result Value Ref Range   Prothrombin Time 16.8 (H) 11.6 - 15.2 seconds   INR 1.35 0.00 - 1.49  Troponin I     Status: None   Collection Time: 07/28/15  8:28 PM  Result Value Ref Range   Troponin I <0.03 <0.031 ng/mL   Ct Head Wo Contrast  07/28/2015  CLINICAL DATA:  Fall. EXAM: CT HEAD WITHOUT CONTRAST CT MAXILLOFACIAL WITHOUT CONTRAST CT CERVICAL SPINE WITHOUT CONTRAST TECHNIQUE: Multidetector CT imaging of the head, cervical spine, and maxillofacial structures were performed using the standard protocol without intravenous contrast. Multiplanar CT image reconstructions of the cervical spine and maxillofacial structures were also generated. COMPARISON:  CT scan  of head of February 09, 2013. FINDINGS: CT HEAD FINDINGS Mildly depressed and comminuted frontal skull fracture is noted. Mild bilateral frontal subarachnoid hemorrhage is noted. Mild diffuse cortical atrophy is noted. Mild chronic ischemic white matter disease is noted. Ventricular size is within normal limits. No midline shift is noted. Possible 1 cm left frontal intraparenchymal hemorrhage is noted. CT MAXILLOFACIAL FINDINGS The mandible appears normal moderately depressed left nasal bone fracture is noted. Mildly displaced left zygomatic arch fracture is noted, with mildly displaced left posterior maxillary wall fracture. Left pterygoid plate appears normal. Moderately displaced and comminuted fracture is seen involving right pterygoid plate with associated fracture of the posterior wall of the right maxillary sinus. Minimally displaced fracture is seen involving the right lamina papyracea. Moderately displaced right anterior maxillary wall fracture is noted. Hemorrhage is noted in both maxillary sinuses as well as probably in the ethmoid and  sphenoid sinuses. Fractures are seen involving the lateral orbital walls bilaterally. Mildly displaced fractures are seen involving the superior orbital roofs bilaterally. Mildly displaced right orbital floor fracture is noted. Large amount of soft tissue gas is seen in the soft tissues lateral to the right maxillary sinus. CT CERVICAL SPINE FINDINGS No significant spondylolisthesis is noted. Mildly displaced fracture is seen involving the posterior spinous process of C5. Mild degenerative disc disease is noted at C3-4, C5-6 and C6-7. Visualized lung apices are unremarkable. IMPRESSION: Mild diffuse cortical atrophy. Mild chronic ischemic white matter disease. Mild bilateral frontal subarachnoid hemorrhage is noted. Probable 1 cm left frontal intraparenchymal hemorrhage is noted. No midline shift is noted. Small amount of pneumocephalus is noted. Mildly depressed and comminuted bilateral frontal skull fracture is noted. Moderately depressed left nasal bone fracture. Mildly displaced left zygomatic arch fracture. Mildly displaced left posterior maxillary wall fracture. Moderately displaced and comminuted fracture is seen involving the posterior wall of the right maxillary sinus and the right pterygoid plate. Hemorrhage is noted in both maxillary, ethmoid and sphenoid sinuses. Minimally displaced fracture seen involving the right lamina papyracea. Moderately displaced right anterior maxillary wall fracture is noted. Mildly displaced fractures are seen involving the superior orbital roofs bilaterally. Mildly displaced right orbital floor fracture is noted. Mildly displaced bilateral lateral orbital wall fractures. Mildly displaced fracture is seen involving posterior spinous process of C5. Multilevel degenerative disc disease is noted in the cervical spine. No significant spondylolisthesis is noted. Critical Value/emergent results were called by telephone at the time of interpretation on 07/28/2015 at 5:43 pm to Dr.  Bary Castilla , who verbally acknowledged these results. Electronically Signed   By: Lupita Raider, M.D.   On: 07/28/2015 17:44   Ct Cervical Spine Wo Contrast  07/28/2015  CLINICAL DATA:  Fall. EXAM: CT HEAD WITHOUT CONTRAST CT MAXILLOFACIAL WITHOUT CONTRAST CT CERVICAL SPINE WITHOUT CONTRAST TECHNIQUE: Multidetector CT imaging of the head, cervical spine, and maxillofacial structures were performed using the standard protocol without intravenous contrast. Multiplanar CT image reconstructions of the cervical spine and maxillofacial structures were also generated. COMPARISON:  CT scan of head of February 09, 2013. FINDINGS: CT HEAD FINDINGS Mildly depressed and comminuted frontal skull fracture is noted. Mild bilateral frontal subarachnoid hemorrhage is noted. Mild diffuse cortical atrophy is noted. Mild chronic ischemic white matter disease is noted. Ventricular size is within normal limits. No midline shift is noted. Possible 1 cm left frontal intraparenchymal hemorrhage is noted. CT MAXILLOFACIAL FINDINGS The mandible appears normal moderately depressed left nasal bone fracture is noted. Mildly displaced left zygomatic arch fracture is noted,  with mildly displaced left posterior maxillary wall fracture. Left pterygoid plate appears normal. Moderately displaced and comminuted fracture is seen involving right pterygoid plate with associated fracture of the posterior wall of the right maxillary sinus. Minimally displaced fracture is seen involving the right lamina papyracea. Moderately displaced right anterior maxillary wall fracture is noted. Hemorrhage is noted in both maxillary sinuses as well as probably in the ethmoid and sphenoid sinuses. Fractures are seen involving the lateral orbital walls bilaterally. Mildly displaced fractures are seen involving the superior orbital roofs bilaterally. Mildly displaced right orbital floor fracture is noted. Large amount of soft tissue gas is seen in the soft  tissues lateral to the right maxillary sinus. CT CERVICAL SPINE FINDINGS No significant spondylolisthesis is noted. Mildly displaced fracture is seen involving the posterior spinous process of C5. Mild degenerative disc disease is noted at C3-4, C5-6 and C6-7. Visualized lung apices are unremarkable. IMPRESSION: Mild diffuse cortical atrophy. Mild chronic ischemic white matter disease. Mild bilateral frontal subarachnoid hemorrhage is noted. Probable 1 cm left frontal intraparenchymal hemorrhage is noted. No midline shift is noted. Small amount of pneumocephalus is noted. Mildly depressed and comminuted bilateral frontal skull fracture is noted. Moderately depressed left nasal bone fracture. Mildly displaced left zygomatic arch fracture. Mildly displaced left posterior maxillary wall fracture. Moderately displaced and comminuted fracture is seen involving the posterior wall of the right maxillary sinus and the right pterygoid plate. Hemorrhage is noted in both maxillary, ethmoid and sphenoid sinuses. Minimally displaced fracture seen involving the right lamina papyracea. Moderately displaced right anterior maxillary wall fracture is noted. Mildly displaced fractures are seen involving the superior orbital roofs bilaterally. Mildly displaced right orbital floor fracture is noted. Mildly displaced bilateral lateral orbital wall fractures. Mildly displaced fracture is seen involving posterior spinous process of C5. Multilevel degenerative disc disease is noted in the cervical spine. No significant spondylolisthesis is noted. Critical Value/emergent results were called by telephone at the time of interpretation on 07/28/2015 at 5:43 pm to Dr. Bary Castilla , who verbally acknowledged these results. Electronically Signed   By: Lupita Raider, M.D.   On: 07/28/2015 17:44   Dg Hand 2 View Left  07/28/2015  CLINICAL DATA:  Larey Seat today, pain at MCP joints, bruising EXAM: LEFT HAND - 2 VIEW COMPARISON:  LEFT wrist  radiographs 05/19/2014 FINDINGS: Diffuse osseous demineralization. Scattered degenerative changes of interphalangeal joints. Osteolysis versus resection of trapezium. Degenerative changes first and second MCP joints. No acute fracture, dislocation, or bone destruction. Soft tissue swelling at distal phalanx index finger. Corticated ossicle noted dorsal to mid carpals appear present since 2016. Additional osseous density dorsal to the distal carpal row, seen only on lateral view, was not definitely visualized previously and is indeterminate in age. IMPRESSION: Osseous demineralization with scattered degenerative changes as above. Additional calcific density is seen dorsal to the distal carpal row on lateral view new since prior exam, age-indeterminate ; correlation for pain/tenderness at this site recommended to exclude acute fracture. Electronically Signed   By: Ulyses Southward M.D.   On: 07/28/2015 18:39   Dg Chest Portable 1 View  07/28/2015  CLINICAL DATA:  82 year old male with fall. EXAM: PORTABLE CHEST 1 VIEW COMPARISON:  07/28/2015 and prior radiographs FINDINGS: The cardiomediastinal silhouette is unremarkable. Right lower lung atelectasis versus airspace disease noted. There is no evidence of pulmonary edema, suspicious pulmonary nodule/mass, pleural effusion, or pneumothorax. No acute bony abnormalities are identified. IMPRESSION: Right lower lung atelectasis versus airspace disease. No evidence of pneumothorax. Electronically  Signed   By: Harmon Pier M.D.   On: 07/28/2015 18:32   Dg Chest Portable 1 View  07/28/2015  CLINICAL DATA:  Fall EXAM: PORTABLE CHEST 1 VIEW COMPARISON:  05/18/2014 chest radiograph. FINDINGS: A portion of the costophrenic angles is excluded from the image. Stable cardiomediastinal silhouette with normal heart size. There is a nonspecific lucency at the right lung base, cannot exclude a small basilar right pneumothorax. No left pneumothorax. No pleural effusion. Hyperinflated  lungs. Emphysema. No pulmonary edema. Mild curvilinear opacities at the right lung base. Stable small granuloma in the left mid lung. No displaced fractures in the visualized chest, noting limited visualization of the right lower ribs due to overlying wires. IMPRESSION: 1. Nonspecific lucency at the right lung base, cannot exclude a small basilar right pneumothorax. Consider further evaluation with repeat upright PA and lateral chest radiographs and/or chest CT as clinically warranted . 2. Hyperinflated lungs and emphysema, suggesting COPD. Mild curvilinear opacity at the right lung base, probably atelectasis or scarring. These results were called by telephone at the time of interpretation on 07/28/2015 at 5:55 pm to Dr. Bary Castilla , who verbally acknowledged these results. Electronically Signed   By: Delbert Phenix M.D.   On: 07/28/2015 17:56   Ct Maxillofacial Wo Cm  07/28/2015  CLINICAL DATA:  Fall. EXAM: CT HEAD WITHOUT CONTRAST CT MAXILLOFACIAL WITHOUT CONTRAST CT CERVICAL SPINE WITHOUT CONTRAST TECHNIQUE: Multidetector CT imaging of the head, cervical spine, and maxillofacial structures were performed using the standard protocol without intravenous contrast. Multiplanar CT image reconstructions of the cervical spine and maxillofacial structures were also generated. COMPARISON:  CT scan of head of February 09, 2013. FINDINGS: CT HEAD FINDINGS Mildly depressed and comminuted frontal skull fracture is noted. Mild bilateral frontal subarachnoid hemorrhage is noted. Mild diffuse cortical atrophy is noted. Mild chronic ischemic white matter disease is noted. Ventricular size is within normal limits. No midline shift is noted. Possible 1 cm left frontal intraparenchymal hemorrhage is noted. CT MAXILLOFACIAL FINDINGS The mandible appears normal moderately depressed left nasal bone fracture is noted. Mildly displaced left zygomatic arch fracture is noted, with mildly displaced left posterior maxillary wall  fracture. Left pterygoid plate appears normal. Moderately displaced and comminuted fracture is seen involving right pterygoid plate with associated fracture of the posterior wall of the right maxillary sinus. Minimally displaced fracture is seen involving the right lamina papyracea. Moderately displaced right anterior maxillary wall fracture is noted. Hemorrhage is noted in both maxillary sinuses as well as probably in the ethmoid and sphenoid sinuses. Fractures are seen involving the lateral orbital walls bilaterally. Mildly displaced fractures are seen involving the superior orbital roofs bilaterally. Mildly displaced right orbital floor fracture is noted. Large amount of soft tissue gas is seen in the soft tissues lateral to the right maxillary sinus. CT CERVICAL SPINE FINDINGS No significant spondylolisthesis is noted. Mildly displaced fracture is seen involving the posterior spinous process of C5. Mild degenerative disc disease is noted at C3-4, C5-6 and C6-7. Visualized lung apices are unremarkable. IMPRESSION: Mild diffuse cortical atrophy. Mild chronic ischemic white matter disease. Mild bilateral frontal subarachnoid hemorrhage is noted. Probable 1 cm left frontal intraparenchymal hemorrhage is noted. No midline shift is noted. Small amount of pneumocephalus is noted. Mildly depressed and comminuted bilateral frontal skull fracture is noted. Moderately depressed left nasal bone fracture. Mildly displaced left zygomatic arch fracture. Mildly displaced left posterior maxillary wall fracture. Moderately displaced and comminuted fracture is seen involving the posterior wall of the right  maxillary sinus and the right pterygoid plate. Hemorrhage is noted in both maxillary, ethmoid and sphenoid sinuses. Minimally displaced fracture seen involving the right lamina papyracea. Moderately displaced right anterior maxillary wall fracture is noted. Mildly displaced fractures are seen involving the superior orbital roofs  bilaterally. Mildly displaced right orbital floor fracture is noted. Mildly displaced bilateral lateral orbital wall fractures. Mildly displaced fracture is seen involving posterior spinous process of C5. Multilevel degenerative disc disease is noted in the cervical spine. No significant spondylolisthesis is noted. Critical Value/emergent results were called by telephone at the time of interpretation on 07/28/2015 at 5:43 pm to Dr. Bary CastillaOURTENEY MACKUEN , who verbally acknowledged these results. Electronically Signed   By: Lupita RaiderJames  Green Jr, M.D.   On: 07/28/2015 17:44     ASSESSMENT:  1. Atrial fibrillation, not sure if paroxysmal or persistent   - V rate well controlled - was on coumadin for stroke prevention  2. Recurrent fall, had hip fracture in 04/2015 and now significant facial trauma, intracranial hemorrhage and C spine fracture   PLAN/DISCUSSION:  Agree with stopping coumadin. Unfortunately he is at very high risk of recurrent falls with risk of significant injury. In future, may be evaluated for left atrial closure device for stroke prevention instead of oral anticoagulants.    Continue beta blocker for rate control Would avoid diuretics for HTN in this patient elderly patient with recurrent falls.    Cardiology will follow.     Nevin BloodgoodAmir Mohab Ashby, MD

## 2015-07-28 NOTE — ED Notes (Signed)
MD advised for pt to be NPO at this time

## 2015-07-28 NOTE — ED Notes (Signed)
ENT present at bedside

## 2015-07-28 NOTE — Consult Note (Signed)
Aletta EdouardMoser, Bertie [id number] 11/02/1926 Briscoe Deutscherimothy S Opyd, MD  Reason for Consult: facial fractures  HPI: 82yo who apparently fell on his face today. CT scan shows left zygoma fracture, R>L maxillary wall fractures, nondisplaced right orbital floor fracture, absent frontal sinuses with supraorbital frontal bone fractures, minimally displaced nasal bone fractures. ENT was consulted for the above. Allergies:  Allergies  Allergen Reactions  . Advair Diskus [Fluticasone-Salmeterol] Other (See Comments)  . Dilaudid [Hydromorphone Hcl] Other (See Comments)    Makes patient very loopy and disoriented  . Morphine And Related Other (See Comments)    ROS: Review of systems + for facial pain, otherwise negative x 12 systems  except per HPI and  except per HPI.  PMH:  Past Medical History  Diagnosis Date  . PAF (paroxysmal atrial fibrillation) (HCC)     Managed with rate control and coumadin  . COPD (chronic obstructive pulmonary disease) (HCC)   . HTN (hypertension)   . Hyperlipemia   . OA (osteoarthritis)   . Chronic anticoagulation   . Incomplete rotator cuff tear or rupture of right shoulder, not specified as traumatic 2012  . Chronic anticoagulation   . Nocturia   . Osteoarthritis of right hip 03/31/2012  . Shortness of breath     FH:  Family History  Problem Relation Age of Onset  . Cancer Father   . Cancer Mother   . Other Brother     SUICIDE    SH:  Social History   Social History  . Marital Status: Married    Spouse Name: N/A  . Number of Children: N/A  . Years of Education: N/A   Occupational History  . retired    Social History Main Topics  . Smoking status: Current Some Day Smoker -- 44 years    Types: Pipe  . Smokeless tobacco: Never Used     Comment: Smoked cigarettes early age socially  . Alcohol Use: No  . Drug Use: No  . Sexual Activity: No   Other Topics Concern  . Not on file   Social History Narrative    PSH:  Past Surgical History  Procedure  Laterality Date  . Total hip arthroplasty  1998  . Shoulder arthroscopy  1994    RIGHT  . Cholecystectomy    . Appendectomy    . Cataract extraction w/ intraocular lens  implant, bilateral    . Right knee arthroscopy    . Total hip arthroplasty  03/31/2012    RIGHT HIP  . Total hip arthroplasty  03/31/2012    Procedure: TOTAL HIP ARTHROPLASTY;  Surgeon: Eulas PostJoshua P Landau, MD;  Location: [facility] OR;  Service: Orthopedics;  Laterality: Right;    Physical  Exam: diffuse facial ecchymosis and periorbital edema/ecchy,osis/racoon eyes. Nasal abrasions. edentulous maxilla. Grossly stable palate. Extraocular movements are intact and symmetric bilaterally in all four directions with no entrapment. Nasal bone grossly midline. Some nasal and facial abrasions. CN 2-12 grossly intact and symmetric other than expected midfacial hypoesthesia. EAC/TMs normal BL. Oral cavity, lips, gums, ororpharynx normal with no masses or lesions.  Neck in C-collar.   A/P: multiple facial fractures all of which are minimally or non-displaced. He is edentulous in the maxilla so occlusion is not a concern, and the frontal sinuses are hypoplastic/absent, and Neurosurgery does not feel the frontal skull fracture needs fixation. His facial fractures will heal with observation in ~ 4-6 weeks. He should refrain from blowing his nose for 6 weeks, use topical neosporin on the abrasions, and eat  a soft-no chew diet to allow the fractures to heal. Follow up as needed.   Melvenia Beam 07/28/2015 7:31 PM

## 2015-07-28 NOTE — ED Notes (Signed)
Pt arrived with C collar in place. Two RN's replaced C collar with aspen collar. Pt's family at bedside

## 2015-07-28 NOTE — ED Provider Notes (Signed)
CSN: [id number]     Arrival date & time 07/28/15  1651 History   First MD Initiated Contact with Patient 07/28/15 1659     Chief Complaint  Patient presents with  . Fall     (Consider location/radiation/quality/duration/timing/severity/associated sxs/prior Treatment) Patient is a 82 y.o. male presenting with fall. The history is provided by the patient, a relative and the EMS personnel.  Fall This is a new problem. The current episode started today (Patietn fell down 3 concrete steps approximately 3 hours prior to arrival). The problem has been unchanged. Associated symptoms include arthralgias (left hand), headaches, neck pain and weakness. Pertinent negatives include no abdominal pain, chest pain, diaphoresis, nausea, rash, sore throat or vomiting. Associated symptoms comments: Left hand pain, headache, facial lacerations. Exacerbated by: falling. He has tried nothing for the symptoms.    Past Medical History  Diagnosis Date  . PAF (paroxysmal atrial fibrillation) (HCC)     Managed with rate control and coumadin  . COPD (chronic obstructive pulmonary disease) (HCC)   . HTN (hypertension)   . Hyperlipemia   . OA (osteoarthritis)   . Chronic anticoagulation   . Incomplete rotator cuff tear or rupture of right shoulder, not specified as traumatic 2012  . Chronic anticoagulation   . Nocturia   . Osteoarthritis of right hip 03/31/2012  . Shortness of breath    Past Surgical History  Procedure Laterality Date  . Total hip arthroplasty  1998  . Shoulder arthroscopy  1994    RIGHT  . Cholecystectomy    . Appendectomy    . Cataract extraction w/ intraocular lens  implant, bilateral    . Right knee arthroscopy    . Total hip arthroplasty  03/31/2012    RIGHT HIP  . Total hip arthroplasty  03/31/2012    Procedure: TOTAL HIP ARTHROPLASTY;  Surgeon: Eulas Post, MD;  Location: [facility] OR;  Service: Orthopedics;  Laterality: Right;   Family History  Problem Relation Age of Onset  .  Cancer Father   . Cancer Mother   . Other Brother     SUICIDE   Social History  Substance Use Topics  . Smoking status: Current Some Day Smoker -- 44 years    Types: Pipe  . Smokeless tobacco: Never Used     Comment: Smoked cigarettes early age socially  . Alcohol Use: No    Review of Systems  Constitutional: Negative for diaphoresis, activity change and appetite change.  HENT: Positive for facial swelling and nosebleeds (s/p fall). Negative for sore throat, tinnitus, trouble swallowing and voice change.   Eyes: Negative for pain, redness and visual disturbance.  Respiratory: Negative for chest tightness, shortness of breath and wheezing.   Cardiovascular: Negative for chest pain, palpitations and leg swelling.  Gastrointestinal: Negative for nausea, vomiting, abdominal pain, diarrhea, constipation and abdominal distention.  Endocrine: Negative.   Genitourinary: Negative.  Negative for dysuria, decreased urine volume, scrotal swelling and testicular pain.  Musculoskeletal: Positive for arthralgias (left hand) and neck pain.  Skin: Positive for wound. Negative for rash.  Neurological: Positive for facial asymmetry, weakness and headaches. Negative for dizziness, tremors and syncope (patient denies any LOC).  Hematological: Bruises/bleeds easily.  Psychiatric/Behavioral: Negative for suicidal ideas, hallucinations and self-injury. The patient is not nervous/anxious.       Allergies  Advair diskus; Dilaudid; and Morphine and related  Home Medications   Prior to Admission medications   Medication Sig Start Date End Date Taking? Authorizing Provider  Alfuzosin HCl (UROXATRAL PO)  Take 10 mg by mouth 2 (two) times daily.     Historical Provider, MD  atenolol (TENORMIN) 50 MG tablet Take 0.5 tablets (25 mg total) by mouth daily. 09/25/10   Rosalio Macadamia, NP  atorvastatin (LIPITOR) 10 MG tablet Take 10 mg by mouth daily.      Historical Provider, MD  cephALEXin (KEFLEX) 500 MG  capsule Take 1 capsule (500 mg total) by mouth 2 (two) times daily. 01/22/15   Trixie Dredge, PA-C  digoxin (LANOXIN) 0.125 MG tablet Take 1 tablet (125 mcg total) by mouth daily. 09/27/10 01/22/15  Roger Shelter, MD  feeding supplement, ENSURE COMPLETE, (ENSURE COMPLETE) LIQD Take 237 mLs by mouth 2 (two) times daily between meals. Patient taking differently: Take 237 mLs by mouth every morning.  05/20/14   Leroy Sea, MD  HYDROcodone-acetaminophen (NORCO/VICODIN) 5-325 MG per tablet Take 1 tablet by mouth every 6 (six) hours as needed for moderate pain. 05/20/14   Leroy Sea, MD  magnesium hydroxide (MILK OF MAGNESIA) 400 MG/5ML suspension Take 5 mLs by mouth daily as needed for mild constipation.    Historical Provider, MD  tiotropium (SPIRIVA) 18 MCG inhalation capsule Place 18 mcg into inhaler and inhale daily.    Historical Provider, MD  triamterene-hydrochlorothiazide (MAXZIDE-25) 37.5-25 MG per tablet Take 0.5 tablets by mouth daily. 12/19/14   Historical Provider, MD  warfarin (COUMADIN) 5 MG tablet Take 1 tablet (5 mg total) by mouth daily with breakfast. 05/17/15   Lars Masson, MD   BP 127/74 mmHg  Pulse 85  Temp(Src) 97.7 F (36.5 C) (Oral)  Resp 20  Ht 6\' 1"  (1.854 m)  Wt 67.7 kg  BMI 19.70 kg/m2  SpO2 100% Physical Exam  Constitutional: He is oriented to person, place, and time. He appears well-developed and well-nourished. No distress.  Elderly white male sitting in hospital med in c-collar, face trauma evident. NAD  HENT:  Right Ear: External ear normal.  Left Ear: External ear normal.  Facial brusing with active bleeding. Epistaxsis. No visible nasal septal hematoma.   Eyes: Pupils are equal, round, and reactive to light.  Neck: No JVD present. No tracheal deviation present. No thyromegaly present.  Neck tenderness  Cardiovascular: Normal rate and intact distal pulses.  Exam reveals no gallop and no friction rub.   No murmur heard. Pulmonary/Chest: Effort  normal and breath sounds normal. No stridor. No respiratory distress. He has no wheezes. He has no rales.  Abdominal: Soft. He exhibits no distension. There is no tenderness. There is no rebound and no guarding.  Musculoskeletal: Normal range of motion. He exhibits tenderness (left hand tenderness). He exhibits no edema.  Neurological: He is alert and oriented to person, place, and time. No cranial nerve deficit. He exhibits normal muscle tone. Coordination normal.  GCS 15. Slight left sided facial droop  Skin: Skin is warm and dry. No rash noted. He is not diaphoretic.  Psychiatric: He has a normal mood and affect. His behavior is normal.  Nursing note and vitals reviewed.   ED Course  Procedures (including critical care time) Labs Review Labs Reviewed  PROTIME-INR - Abnormal; Notable for the following:    Prothrombin Time 21.9 (*)    INR 1.92 (*)    All other components within normal limits  CBC - Abnormal; Notable for the following:    WBC 12.8 (*)    RBC 3.87 (*)    Hemoglobin 12.2 (*)    HCT 35.8 (*)    All  other components within normal limits  DIFFERENTIAL - Abnormal; Notable for the following:    Lymphs Abs 4.2 (*)    All other components within normal limits  COMPREHENSIVE METABOLIC PANEL - Abnormal; Notable for the following:    Sodium 134 (*)    CO2 19 (*)    Glucose, Bld 132 (*)    BUN 29 (*)    Creatinine, Ser 1.33 (*)    Total Protein 6.4 (*)    GFR calc non Af Amer 47 (*)    GFR calc Af Amer 54 (*)    All other components within normal limits  PROTIME-INR - Abnormal; Notable for the following:    Prothrombin Time 16.8 (*)    All other components within normal limits  PROTIME-INR - Abnormal; Notable for the following:    Prothrombin Time 16.2 (*)    All other components within normal limits  I-STAT CHEM 8, ED - Abnormal; Notable for the following:    BUN 32 (*)    Glucose, Bld 135 (*)    Calcium, Ion 1.06 (*)    All other components within normal limits   CBG MONITORING, ED - Abnormal; Notable for the following:    Glucose-Capillary 132 (*)    All other components within normal limits  ETHANOL  APTT  TROPONIN I  PROTIME-INR  PROTIME-INR  CBC  COMPREHENSIVE METABOLIC PANEL  APTT  PROTIME-INR  PROTIME-INR  I-STAT TROPOININ, ED  PREPARE FRESH FROZEN PLASMA    Imaging Review Ct Head Wo Contrast  07/28/2015  CLINICAL DATA:  Fall. EXAM: CT HEAD WITHOUT CONTRAST CT MAXILLOFACIAL WITHOUT CONTRAST CT CERVICAL SPINE WITHOUT CONTRAST TECHNIQUE: Multidetector CT imaging of the head, cervical spine, and maxillofacial structures were performed using the standard protocol without intravenous contrast. Multiplanar CT image reconstructions of the cervical spine and maxillofacial structures were also generated. COMPARISON:  CT scan of head of February 09, 2013. FINDINGS: CT HEAD FINDINGS Mildly depressed and comminuted frontal skull fracture is noted. Mild bilateral frontal subarachnoid hemorrhage is noted. Mild diffuse cortical atrophy is noted. Mild chronic ischemic white matter disease is noted. Ventricular size is within normal limits. No midline shift is noted. Possible 1 cm left frontal intraparenchymal hemorrhage is noted. CT MAXILLOFACIAL FINDINGS The mandible appears normal moderately depressed left nasal bone fracture is noted. Mildly displaced left zygomatic arch fracture is noted, with mildly displaced left posterior maxillary wall fracture. Left pterygoid plate appears normal. Moderately displaced and comminuted fracture is seen involving right pterygoid plate with associated fracture of the posterior wall of the right maxillary sinus. Minimally displaced fracture is seen involving the right lamina papyracea. Moderately displaced right anterior maxillary wall fracture is noted. Hemorrhage is noted in both maxillary sinuses as well as probably in the ethmoid and sphenoid sinuses. Fractures are seen involving the lateral orbital walls bilaterally. Mildly  displaced fractures are seen involving the superior orbital roofs bilaterally. Mildly displaced right orbital floor fracture is noted. Large amount of soft tissue gas is seen in the soft tissues lateral to the right maxillary sinus. CT CERVICAL SPINE FINDINGS No significant spondylolisthesis is noted. Mildly displaced fracture is seen involving the posterior spinous process of C5. Mild degenerative disc disease is noted at C3-4, C5-6 and C6-7. Visualized lung apices are unremarkable. IMPRESSION: Mild diffuse cortical atrophy. Mild chronic ischemic white matter disease. Mild bilateral frontal subarachnoid hemorrhage is noted. Probable 1 cm left frontal intraparenchymal hemorrhage is noted. No midline shift is noted. Small amount of pneumocephalus is noted. Mildly depressed  and comminuted bilateral frontal skull fracture is noted. Moderately depressed left nasal bone fracture. Mildly displaced left zygomatic arch fracture. Mildly displaced left posterior maxillary wall fracture. Moderately displaced and comminuted fracture is seen involving the posterior wall of the right maxillary sinus and the right pterygoid plate. Hemorrhage is noted in both maxillary, ethmoid and sphenoid sinuses. Minimally displaced fracture seen involving the right lamina papyracea. Moderately displaced right anterior maxillary wall fracture is noted. Mildly displaced fractures are seen involving the superior orbital roofs bilaterally. Mildly displaced right orbital floor fracture is noted. Mildly displaced bilateral lateral orbital wall fractures. Mildly displaced fracture is seen involving posterior spinous process of C5. Multilevel degenerative disc disease is noted in the cervical spine. No significant spondylolisthesis is noted. Critical Value/emergent results were called by telephone at the time of interpretation on 07/28/2015 at 5:43 pm to Dr. Bary Castilla , who verbally acknowledged these results. Electronically Signed   By: Lupita Raider, M.D.   On: 07/28/2015 17:44   Ct Cervical Spine Wo Contrast  07/28/2015  CLINICAL DATA:  Fall. EXAM: CT HEAD WITHOUT CONTRAST CT MAXILLOFACIAL WITHOUT CONTRAST CT CERVICAL SPINE WITHOUT CONTRAST TECHNIQUE: Multidetector CT imaging of the head, cervical spine, and maxillofacial structures were performed using the standard protocol without intravenous contrast. Multiplanar CT image reconstructions of the cervical spine and maxillofacial structures were also generated. COMPARISON:  CT scan of head of February 09, 2013. FINDINGS: CT HEAD FINDINGS Mildly depressed and comminuted frontal skull fracture is noted. Mild bilateral frontal subarachnoid hemorrhage is noted. Mild diffuse cortical atrophy is noted. Mild chronic ischemic white matter disease is noted. Ventricular size is within normal limits. No midline shift is noted. Possible 1 cm left frontal intraparenchymal hemorrhage is noted. CT MAXILLOFACIAL FINDINGS The mandible appears normal moderately depressed left nasal bone fracture is noted. Mildly displaced left zygomatic arch fracture is noted, with mildly displaced left posterior maxillary wall fracture. Left pterygoid plate appears normal. Moderately displaced and comminuted fracture is seen involving right pterygoid plate with associated fracture of the posterior wall of the right maxillary sinus. Minimally displaced fracture is seen involving the right lamina papyracea. Moderately displaced right anterior maxillary wall fracture is noted. Hemorrhage is noted in both maxillary sinuses as well as probably in the ethmoid and sphenoid sinuses. Fractures are seen involving the lateral orbital walls bilaterally. Mildly displaced fractures are seen involving the superior orbital roofs bilaterally. Mildly displaced right orbital floor fracture is noted. Large amount of soft tissue gas is seen in the soft tissues lateral to the right maxillary sinus. CT CERVICAL SPINE FINDINGS No significant  spondylolisthesis is noted. Mildly displaced fracture is seen involving the posterior spinous process of C5. Mild degenerative disc disease is noted at C3-4, C5-6 and C6-7. Visualized lung apices are unremarkable. IMPRESSION: Mild diffuse cortical atrophy. Mild chronic ischemic white matter disease. Mild bilateral frontal subarachnoid hemorrhage is noted. Probable 1 cm left frontal intraparenchymal hemorrhage is noted. No midline shift is noted. Small amount of pneumocephalus is noted. Mildly depressed and comminuted bilateral frontal skull fracture is noted. Moderately depressed left nasal bone fracture. Mildly displaced left zygomatic arch fracture. Mildly displaced left posterior maxillary wall fracture. Moderately displaced and comminuted fracture is seen involving the posterior wall of the right maxillary sinus and the right pterygoid plate. Hemorrhage is noted in both maxillary, ethmoid and sphenoid sinuses. Minimally displaced fracture seen involving the right lamina papyracea. Moderately displaced right anterior maxillary wall fracture is noted. Mildly displaced fractures are seen involving the  superior orbital roofs bilaterally. Mildly displaced right orbital floor fracture is noted. Mildly displaced bilateral lateral orbital wall fractures. Mildly displaced fracture is seen involving posterior spinous process of C5. Multilevel degenerative disc disease is noted in the cervical spine. No significant spondylolisthesis is noted. Critical Value/emergent results were called by telephone at the time of interpretation on 07/28/2015 at 5:43 pm to Dr. Bary Castilla , who verbally acknowledged these results. Electronically Signed   By: Lupita Raider, M.D.   On: 07/28/2015 17:44   Dg Hand 2 View Left  07/28/2015  CLINICAL DATA:  Larey Seat today, pain at MCP joints, bruising EXAM: LEFT HAND - 2 VIEW COMPARISON:  LEFT wrist radiographs 05/19/2014 FINDINGS: Diffuse osseous demineralization. Scattered degenerative  changes of interphalangeal joints. Osteolysis versus resection of trapezium. Degenerative changes first and second MCP joints. No acute fracture, dislocation, or bone destruction. Soft tissue swelling at distal phalanx index finger. Corticated ossicle noted dorsal to mid carpals appear present since 2016. Additional osseous density dorsal to the distal carpal row, seen only on lateral view, was not definitely visualized previously and is indeterminate in age. IMPRESSION: Osseous demineralization with scattered degenerative changes as above. Additional calcific density is seen dorsal to the distal carpal row on lateral view new since prior exam, age-indeterminate ; correlation for pain/tenderness at this site recommended to exclude acute fracture. Electronically Signed   By: Ulyses Southward M.D.   On: 07/28/2015 18:39   Dg Chest Portable 1 View  07/28/2015  CLINICAL DATA:  82 year old male with fall. EXAM: PORTABLE CHEST 1 VIEW COMPARISON:  07/28/2015 and prior radiographs FINDINGS: The cardiomediastinal silhouette is unremarkable. Right lower lung atelectasis versus airspace disease noted. There is no evidence of pulmonary edema, suspicious pulmonary nodule/mass, pleural effusion, or pneumothorax. No acute bony abnormalities are identified. IMPRESSION: Right lower lung atelectasis versus airspace disease. No evidence of pneumothorax. Electronically Signed   By: Harmon Pier M.D.   On: 07/28/2015 18:32   Dg Chest Portable 1 View  07/28/2015  CLINICAL DATA:  Fall EXAM: PORTABLE CHEST 1 VIEW COMPARISON:  05/18/2014 chest radiograph. FINDINGS: A portion of the costophrenic angles is excluded from the image. Stable cardiomediastinal silhouette with normal heart size. There is a nonspecific lucency at the right lung base, cannot exclude a small basilar right pneumothorax. No left pneumothorax. No pleural effusion. Hyperinflated lungs. Emphysema. No pulmonary edema. Mild curvilinear opacities at the right lung base.  Stable small granuloma in the left mid lung. No displaced fractures in the visualized chest, noting limited visualization of the right lower ribs due to overlying wires. IMPRESSION: 1. Nonspecific lucency at the right lung base, cannot exclude a small basilar right pneumothorax. Consider further evaluation with repeat upright PA and lateral chest radiographs and/or chest CT as clinically warranted . 2. Hyperinflated lungs and emphysema, suggesting COPD. Mild curvilinear opacity at the right lung base, probably atelectasis or scarring. These results were called by telephone at the time of interpretation on 07/28/2015 at 5:55 pm to Dr. Bary Castilla , who verbally acknowledged these results. Electronically Signed   By: Delbert Phenix M.D.   On: 07/28/2015 17:56   Ct Maxillofacial Wo Cm  07/28/2015  CLINICAL DATA:  Fall. EXAM: CT HEAD WITHOUT CONTRAST CT MAXILLOFACIAL WITHOUT CONTRAST CT CERVICAL SPINE WITHOUT CONTRAST TECHNIQUE: Multidetector CT imaging of the head, cervical spine, and maxillofacial structures were performed using the standard protocol without intravenous contrast. Multiplanar CT image reconstructions of the cervical spine and maxillofacial structures were also generated. COMPARISON:  CT  scan of head of February 09, 2013. FINDINGS: CT HEAD FINDINGS Mildly depressed and comminuted frontal skull fracture is noted. Mild bilateral frontal subarachnoid hemorrhage is noted. Mild diffuse cortical atrophy is noted. Mild chronic ischemic white matter disease is noted. Ventricular size is within normal limits. No midline shift is noted. Possible 1 cm left frontal intraparenchymal hemorrhage is noted. CT MAXILLOFACIAL FINDINGS The mandible appears normal moderately depressed left nasal bone fracture is noted. Mildly displaced left zygomatic arch fracture is noted, with mildly displaced left posterior maxillary wall fracture. Left pterygoid plate appears normal. Moderately displaced and comminuted fracture is  seen involving right pterygoid plate with associated fracture of the posterior wall of the right maxillary sinus. Minimally displaced fracture is seen involving the right lamina papyracea. Moderately displaced right anterior maxillary wall fracture is noted. Hemorrhage is noted in both maxillary sinuses as well as probably in the ethmoid and sphenoid sinuses. Fractures are seen involving the lateral orbital walls bilaterally. Mildly displaced fractures are seen involving the superior orbital roofs bilaterally. Mildly displaced right orbital floor fracture is noted. Large amount of soft tissue gas is seen in the soft tissues lateral to the right maxillary sinus. CT CERVICAL SPINE FINDINGS No significant spondylolisthesis is noted. Mildly displaced fracture is seen involving the posterior spinous process of C5. Mild degenerative disc disease is noted at C3-4, C5-6 and C6-7. Visualized lung apices are unremarkable. IMPRESSION: Mild diffuse cortical atrophy. Mild chronic ischemic white matter disease. Mild bilateral frontal subarachnoid hemorrhage is noted. Probable 1 cm left frontal intraparenchymal hemorrhage is noted. No midline shift is noted. Small amount of pneumocephalus is noted. Mildly depressed and comminuted bilateral frontal skull fracture is noted. Moderately depressed left nasal bone fracture. Mildly displaced left zygomatic arch fracture. Mildly displaced left posterior maxillary wall fracture. Moderately displaced and comminuted fracture is seen involving the posterior wall of the right maxillary sinus and the right pterygoid plate. Hemorrhage is noted in both maxillary, ethmoid and sphenoid sinuses. Minimally displaced fracture seen involving the right lamina papyracea. Moderately displaced right anterior maxillary wall fracture is noted. Mildly displaced fractures are seen involving the superior orbital roofs bilaterally. Mildly displaced right orbital floor fracture is noted. Mildly displaced  bilateral lateral orbital wall fractures. Mildly displaced fracture is seen involving posterior spinous process of C5. Multilevel degenerative disc disease is noted in the cervical spine. No significant spondylolisthesis is noted. Critical Value/emergent results were called by telephone at the time of interpretation on 07/28/2015 at 5:43 pm to Dr. Bary CastillaOURTENEY MACKUEN , who verbally acknowledged these results. Electronically Signed   By: Lupita RaiderJames  Green Jr, M.D.   On: 07/28/2015 17:44   I have personally reviewed and evaluated these images and lab results as part of my medical decision-making.   EKG Interpretation   Date/Time:  Friday July 28 2015 17:23:46 EDT Ventricular Rate:  102 PR Interval:    QRS Duration: 89 QT Interval:  353 QTC Calculation: 460 R Axis:   82 Text Interpretation:  Atrial fibrillation Ventricular premature complex  Borderline right axis deviation Probable anteroseptal infarct, old No  significant change since last tracing Confirmed by Kandis MannanMACKUEN, COURTNEY  [phone]) on 07/28/2015 5:26:28 PM      MDM   Final diagnoses:  SAH (subarachnoid hemorrhage) (HCC)    The patient is a [age] year old male currently on Coumadin for atrial fibrillation who presents after falling down 3 concrete steps and suffering head injuries. On arrival patient is afebrile hemolytically stable. Physical exam as above. No lower back or abdominal  tenderness on exam. Patient's head and C-spine CT shows multiple facial fractures, frontal skull fracture, and bilateral subarachnoid hemorrhages and C5 spinous process fracture. Neurosurgery was consulted and reviews the patient's imaging. Per their request we'll discontinue cervical collar as they state it is not indicated for a spinous process fracture. Per protocol patient given [facility] given he is anticoagulated with head bleed. Trauma surgery, critical care, and face trauma or also consult. The patient will be admitted to the trauma surgery service. Tetanus updated  all patient in the ED. Patient is admitted for further care.  Patient seen with attending, Dr. Juliann Pares, who oversaw clinical decision making.     Lula Olszewski, MD 07/29/15 1610  Abelino Derrick, MD 07/29/15 1626

## 2015-07-28 NOTE — Consult Note (Signed)
Requesting Physician: Dr.  Festus Aloe   Stroke code Reason for consultation:   Stroke code  HPI:                                                                                                                                         Kyle [name] is an 82 y.o. male patient whowas brought in by the EMS after they following down at his house. Patient apparently fell off of steps and fell on his face . Had bleeding from his nose, and has black eyes. EMS noted some possible facial droop and called stroke code in the field. The patient apparently was moving his upper and lower extremities symmetrically. Patient unable to provide any further history. no family is available   Date last known well:  07/28/2015 Time last known well:  Unknown  tPA Given: No: Traumatic subarachnoid hemorrhage  Stroke Risk Factors - atrial fibrillation  Past Medical History: Past Medical History  Diagnosis Date  . PAF (paroxysmal atrial fibrillation) (HCC)     Managed with rate control and coumadin  . COPD (chronic obstructive pulmonary disease) (HCC)   . HTN (hypertension)   . Hyperlipemia   . OA (osteoarthritis)   . Chronic anticoagulation   . Incomplete rotator cuff tear or rupture of right shoulder, not specified as traumatic 2012  . Chronic anticoagulation   . Nocturia   . Osteoarthritis of right hip 03/31/2012  . Shortness of breath     Past Surgical History  Procedure Laterality Date  . Total hip arthroplasty  1998  . Shoulder arthroscopy  1994    RIGHT  . Cholecystectomy    . Appendectomy    . Cataract extraction w/ intraocular lens  implant, bilateral    . Right knee arthroscopy    . Total hip arthroplasty  03/31/2012    RIGHT HIP  . Total hip arthroplasty  03/31/2012    Procedure: TOTAL HIP ARTHROPLASTY;  Surgeon: Eulas Post, MD;  Location: [facility] OR;  Service: Orthopedics;  Laterality: Right;    Family History: Family History  Problem Relation Age of Onset  . Cancer Father   . Cancer Mother    . Other Brother     SUICIDE    Social History:   reports that he has been smoking Pipe.  He has never used smokeless tobacco. He reports that he does not drink alcohol or use illicit drugs.  Allergies:  Allergies  Allergen Reactions  . Advair Diskus [Fluticasone-Salmeterol] Other (See Comments)  . Dilaudid [Hydromorphone Hcl] Other (See Comments)    Makes patient very loopy and disoriented  . Morphine And Related Other (See Comments)     Medications:  Current facility-administered medications:  .  phytonadione (VITAMIN K) 10 mg in dextrose 5 % 50 mL IVPB, 10 mg, Intravenous, STAT, Denisse Whitenack Daniel Nones, MD, Last Rate: 150 mL/hr at 07/28/15 1728, 10 mg at 07/28/15 1728 .  prothrombin complex conc human (KCENTRA) IVPB 1,563 Units, 1,563 Units, Intravenous, STAT, Courteney Lyn Mackuen, MD  Current outpatient prescriptions:  .  Alfuzosin HCl (UROXATRAL PO), Take 10 mg by mouth 2 (two) times daily. , Disp: , Rfl:  .  atenolol (TENORMIN) 50 MG tablet, Take 0.5 tablets (25 mg total) by mouth daily., Disp: , Rfl:  .  atorvastatin (LIPITOR) 10 MG tablet, Take 10 mg by mouth daily.  , Disp: , Rfl:  .  cephALEXin (KEFLEX) 500 MG capsule, Take 1 capsule (500 mg total) by mouth 2 (two) times daily., Disp: 14 capsule, Rfl: 0 .  digoxin (LANOXIN) 0.125 MG tablet, Take 1 tablet (125 mcg total) by mouth daily., Disp: 90 tablet, Rfl: 3 .  feeding supplement, ENSURE COMPLETE, (ENSURE COMPLETE) LIQD, Take 237 mLs by mouth 2 (two) times daily between meals. (Patient taking differently: Take 237 mLs by mouth every morning. ), Disp: , Rfl:  .  HYDROcodone-acetaminophen (NORCO/VICODIN) 5-325 MG per tablet, Take 1 tablet by mouth every 6 (six) hours as needed for moderate pain., Disp: 30 tablet, Rfl: 0 .  magnesium hydroxide (MILK OF MAGNESIA) 400 MG/5ML suspension, Take 5 mLs by mouth  daily as needed for mild constipation., Disp: , Rfl:  .  tiotropium (SPIRIVA) 18 MCG inhalation capsule, Place 18 mcg into inhaler and inhale daily., Disp: , Rfl:  .  triamterene-hydrochlorothiazide (MAXZIDE-25) 37.5-25 MG per tablet, Take 0.5 tablets by mouth daily., Disp: , Rfl:  .  warfarin (COUMADIN) 5 MG tablet, Take 1 tablet (5 mg total) by mouth daily with breakfast., Disp: 30 tablet, Rfl: 0   ROS:                                                                                                                                       History obtained from unobtainable from patient due to age and lack of cooperation  General ROS: negative for - chills, fatigue, fever, night sweats, weight gain or weight loss Psychological ROS: negative for - behavioral disorder, hallucinations, memory difficulties, mood swings or suicidal ideation Ophthalmic ROS: negative for - blurry vision, double vision, eye pain or loss of vision ENT ROS: negative for - epistaxis, nasal discharge, oral lesions, sore throat, tinnitus or vertigo Allergy and Immunology ROS: negative for - hives or itchy/watery eyes Hematological and Lymphatic ROS: negative for - bleeding problems, bruising or swollen lymph nodes Endocrine ROS: negative for - galactorrhea, hair pattern changes, polydipsia/polyuria or temperature intolerance Respiratory ROS: negative for - cough, hemoptysis, shortness of breath or wheezing Cardiovascular ROS: negative for - chest pain, dyspnea on exertion, edema or irregular heartbeat Gastrointestinal ROS: negative for - abdominal pain, diarrhea, hematemesis, nausea/vomiting  or stool incontinence Genito-Urinary ROS: negative for - dysuria, hematuria, incontinence or urinary frequency/urgency Musculoskeletal ROS: negative for - joint swelling or muscular weakness Neurological ROS: as noted in HPI Dermatological ROS: negative for rash and skin lesion changes  Neurologic Examination:                                                                                                     Today's Vitals   07/28/15 1700 07/28/15 1718 07/28/15 1719  BP:   133/95  Resp:   18  Weight: 67.7 kg (149 lb 4 oz) 67.7 kg (149 lb 4 oz)   SpO2:   91%  PainSc:  5    He does have ecchymosis around his eyelids bilaterally, his bleeding from his nose.   Evaluation of higher integrative functions including: Level of alertness: Alert,  Oriented to place and person, not to month or year Speech: fluent, no evidence of dysarthria or aphasia noted.  Test the following cranial nerves: 2-12 grossly intact. No evidence of facial weakness or asymmetry noted.  Motor examination: Normal tone, bulk, full 5/5 motor strength in all 4 extremities Examination of sensation : Normal and symmetric sensation to pinprick in all 4 extremities and on face Test coordination: Normal finger nose testing, with no evidence of limb appendicular ataxia or abnormal involuntary movements or tremors noted.  Gait: Deferred     Lab Results: Basic Metabolic Panel:  Recent Labs Lab 07/28/15 1706  NA 135  K 4.4  CL 101  GLUCOSE 135*  BUN 32*  CREATININE 1.10    Liver Function Tests: No results for input(s): AST, ALT, ALKPHOS, BILITOT, PROT, ALBUMIN in the last 168 hours. No results for input(s): LIPASE, AMYLASE in the last 168 hours. No results for input(s): AMMONIA in the last 168 hours.  CBC:  Recent Labs Lab 07/28/15 1700 07/28/15 1706  WBC 12.8*  --   NEUTROABS PENDING  --   HGB 12.2* 13.6  HCT 35.8* 40.0  MCV 92.5  --   PLT 230  --     Cardiac Enzymes: No results for input(s): CKTOTAL, CKMB, CKMBINDEX, TROPONINI in the last 168 hours.  Lipid Panel: No results for input(s): CHOL, TRIG, HDL, CHOLHDL, VLDL, LDLCALC in the last 168 hours.  CBG:  Recent Labs Lab 07/28/15 1717  GLUCAP 132*    Microbiology: Results for orders placed or performed during the hospital encounter of 01/22/15  Urine culture     Status:  None   Collection Time: 01/22/15  9:15 AM  Result Value Ref Range Status   Specimen Description URINE, RANDOM  Final   Special Requests NONE  Final   Culture >=100,000 COLONIES/mL PROTEUS MIRABILIS  Final   Report Status 01/24/2015 FINAL  Final   Organism ID, Bacteria PROTEUS MIRABILIS  Final      Susceptibility   Proteus mirabilis - MIC*    AMPICILLIN <=2 SENSITIVE Sensitive     CEFAZOLIN <=4 SENSITIVE Sensitive     CEFTRIAXONE <=1 SENSITIVE Sensitive     CIPROFLOXACIN <=0.25 SENSITIVE Sensitive     GENTAMICIN <=1 SENSITIVE  Sensitive     IMIPENEM 4 SENSITIVE Sensitive     NITROFURANTOIN 128 RESISTANT Resistant     TRIMETH/SULFA <=20 SENSITIVE Sensitive     AMPICILLIN/SULBACTAM <=2 SENSITIVE Sensitive     PIP/TAZO <=4 SENSITIVE Sensitive     * >=100,000 COLONIES/mL PROTEUS MIRABILIS     Imaging: CT imaging showed Mild bilateral frontal subarachnoid hemorrhage is noted. Probable 1 cm left frontal intraparenchymal hemorrhage is noted. No midline shift is noted. Small amount of pneumocephalus is noted.  Mildly depressed and comminuted bilateral frontal skull fracture is noted.  Moderately depressed left nasal bone fracture.  Mildly displaced left zygomatic arch fracture.  Mildly displaced left posterior maxillary wall fracture.  Moderately displaced and comminuted fracture is seen involving the posterior wall of the right maxillary sinus and the right pterygoid plate.  Hemorrhage is noted in both maxillary, ethmoid and sphenoid sinuses.  Minimally displaced fracture seen involving the right lamina papyracea.  Moderately displaced right anterior maxillary wall fracture is noted.  Mildly displaced fractures are seen involving the superior orbital roofs bilaterally.  Mildly displaced right orbital floor fracture is noted.  Mildly displaced bilateral lateral orbital wall fractures.  Mildly displaced fracture is seen involving posterior spinous process of  C5.   Assessment and plan:   Kyle [name] is an 82 y.o. male patient who presented after a fall, with multiple traumatic injuries to the face, has bifrontal skull fracture nasal bone fracture zygomatic arch fracture posterior maxillary wall fracture orbital fractures and posterior spinous process of C5 fracture. From neurological standpoint, he is a grossly nonfocal exam as described. He is on Coumadin, with INR of 1.92. Ordered Kcentra for reversal in addition to vitamin K.  Given the significant traumatic head injury with intracranial hemorrhages as described, we'll start empiric Keppra 500 mg twice a day for seizure prophylaxis.  Defer further management  of the facial and skull fractures to the trauma service.  We'll follow-up.

## 2015-07-28 NOTE — ED Notes (Signed)
Son [name] signing Consent for Plasma on pt behalf.

## 2015-07-28 NOTE — ED Notes (Signed)
Pt fell down 3 steps. Pt denies CP/dizziness prior to fall. Pt remembers falling and denies LOC. Pt on coumadin. Code stroke activated by EMS en route due to new facial droop on left side. No weakness noted anywhere else per EMS. BP 131/71, sinus rhythm HR 68. LSN 1530

## 2015-07-28 NOTE — Consult Note (Signed)
CC:  Chief Complaint  Patient presents with  . Fall    HPI: Kyle [name] is a 82 y.o. male s/p fall from stairs.  He complains of headache and facial pain as well as bruising.  He has some neck pain.  He is on coumadin for atrial fibrillation.  PMH: Past Medical History  Diagnosis Date  . PAF (paroxysmal atrial fibrillation) (HCC)     Managed with rate control and coumadin  . COPD (chronic obstructive pulmonary disease) (HCC)   . HTN (hypertension)   . Hyperlipemia   . OA (osteoarthritis)   . Chronic anticoagulation   . Incomplete rotator cuff tear or rupture of right shoulder, not specified as traumatic 2012  . Chronic anticoagulation   . Nocturia   . Osteoarthritis of right hip 03/31/2012  . Shortness of breath     PSH: Past Surgical History  Procedure Laterality Date  . Total hip arthroplasty  1998  . Shoulder arthroscopy  1994    RIGHT  . Cholecystectomy    . Appendectomy    . Cataract extraction w/ intraocular lens  implant, bilateral    . Right knee arthroscopy    . Total hip arthroplasty  03/31/2012    RIGHT HIP  . Total hip arthroplasty  03/31/2012    Procedure: TOTAL HIP ARTHROPLASTY;  Surgeon: Eulas PostJoshua P Landau, MD;  Location: [facility] OR;  Service: Orthopedics;  Laterality: Right;    SH: Social History  Substance Use Topics  . Smoking status: Current Some Day Smoker -- 44 years    Types: Pipe  . Smokeless tobacco: Never Used     Comment: Smoked cigarettes early age socially  . Alcohol Use: No    MEDS: Prior to Admission medications   Medication Sig Start Date End Date Taking? Authorizing Provider  Alfuzosin HCl (UROXATRAL PO) Take 10 mg by mouth 2 (two) times daily.     Historical Provider, MD  atenolol (TENORMIN) 50 MG tablet Take 0.5 tablets (25 mg total) by mouth daily. 09/25/10   Rosalio MacadamiaLori C Gerhardt, NP  atorvastatin (LIPITOR) 10 MG tablet Take 10 mg by mouth daily.      Historical Provider, MD  cephALEXin (KEFLEX) 500 MG capsule Take 1 capsule (500 mg total)  by mouth 2 (two) times daily. 01/22/15   Trixie DredgeEmily West, PA-C  digoxin (LANOXIN) 0.125 MG tablet Take 1 tablet (125 mcg total) by mouth daily. 09/27/10 01/22/15  Roger ShelterStanley [name], MD  feeding supplement, ENSURE COMPLETE, (ENSURE COMPLETE) LIQD Take 237 mLs by mouth 2 (two) times daily between meals. Patient taking differently: Take 237 mLs by mouth every morning.  05/20/14   Leroy SeaPrashant K Singh, MD  HYDROcodone-acetaminophen (NORCO/VICODIN) 5-325 MG per tablet Take 1 tablet by mouth every 6 (six) hours as needed for moderate pain. 05/20/14   Leroy SeaPrashant K Singh, MD  magnesium hydroxide (MILK OF MAGNESIA) 400 MG/5ML suspension Take 5 mLs by mouth daily as needed for mild constipation.    Historical Provider, MD  tiotropium (SPIRIVA) 18 MCG inhalation capsule Place 18 mcg into inhaler and inhale daily.    Historical Provider, MD  triamterene-hydrochlorothiazide (MAXZIDE-25) 37.5-25 MG per tablet Take 0.5 tablets by mouth daily. 12/19/14   Historical Provider, MD  warfarin (COUMADIN) 5 MG tablet Take 1 tablet (5 mg total) by mouth daily with breakfast. 05/17/15   Lars MassonKatarina H Nelson, MD    ALLERGY: Allergies  Allergen Reactions  . Advair Diskus [Fluticasone-Salmeterol] Other (See Comments)  . Dilaudid [Hydromorphone Hcl] Other (See Comments)    Makes  patient very loopy and disoriented  . Morphine And Related Other (See Comments)    ROS: ROS  NEUROLOGIC EXAM: Awake, alert, oriented Memory and concentration grossly intact Speech fluent, appropriate CN grossly intact but cannot completely evaluate because eyes and face are swollen Follows commands briskly throughout Sensation grossly intact to LT  IMAGING: I have independently reviewed his imaging.  He has small frontal SAH.  He has a mildly displaced frontal bone fracture.  He has a C5 spinous process fracture.  IMPRESSION: - 82 y.o. male with injuries as above and mild traumatic brain injury.  PLAN: - Please ask trauma to admit given patient's age and  multisystem involvement - No need to reverse anti-coagulation, just stop coumadin - Observe overnight in ICU - Repeat CT scan in AM - I do not anticipate any need for surgical intervention

## 2015-07-29 DIAGNOSIS — S066X9A Traumatic subarachnoid hemorrhage with loss of consciousness of unspecified duration, initial encounter: Secondary | ICD-10-CM | POA: Diagnosis not present

## 2015-07-29 DIAGNOSIS — D689 Coagulation defect, unspecified: Secondary | ICD-10-CM

## 2015-07-29 DIAGNOSIS — I481 Persistent atrial fibrillation: Secondary | ICD-10-CM

## 2015-07-29 LAB — CBC
HEMATOCRIT: 33.3 % — AB (ref 39.0–52.0)
Hemoglobin: 10.9 g/dL — ABNORMAL LOW (ref 13.0–17.0)
MCH: 30 pg (ref 26.0–34.0)
MCHC: 32.7 g/dL (ref 30.0–36.0)
MCV: 91.7 fL (ref 78.0–100.0)
Platelets: 228 10*3/uL (ref 150–400)
RBC: 3.63 MIL/uL — ABNORMAL LOW (ref 4.22–5.81)
RDW: 14.6 % (ref 11.5–15.5)
WBC: 13.6 10*3/uL — AB (ref 4.0–10.5)

## 2015-07-29 LAB — PREPARE FRESH FROZEN PLASMA: Unit division: 0

## 2015-07-29 LAB — COMPREHENSIVE METABOLIC PANEL
ALBUMIN: 3.5 g/dL (ref 3.5–5.0)
ALT: 19 U/L (ref 17–63)
AST: 27 U/L (ref 15–41)
Alkaline Phosphatase: 50 U/L (ref 38–126)
Anion gap: 8 (ref 5–15)
BILIRUBIN TOTAL: 0.9 mg/dL (ref 0.3–1.2)
BUN: 27 mg/dL — AB (ref 6–20)
CHLORIDE: 103 mmol/L (ref 101–111)
CO2: 24 mmol/L (ref 22–32)
Calcium: 8.8 mg/dL — ABNORMAL LOW (ref 8.9–10.3)
Creatinine, Ser: 1.2 mg/dL (ref 0.61–1.24)
GFR calc Af Amer: >60 mL/min (ref >60)
GFR calc non Af Amer: 53 mL/min — ABNORMAL LOW (ref >60)
GLUCOSE: 184 mg/dL — AB (ref 65–99)
POTASSIUM: 4.5 mmol/L (ref 3.5–5.1)
Sodium: 135 mmol/L (ref 135–145)
TOTAL PROTEIN: 6.2 g/dL — AB (ref 6.5–8.1)

## 2015-07-29 LAB — PROTIME-INR
INR: 1.26 (ref 0.00–1.49)
INR: 1.16 (ref 0.00–1.49)
INR: 1.22 (ref 0.00–1.49)
INR: 1.23 (ref 0.00–1.49)
PROTHROMBIN TIME: 15 s (ref 11.6–15.2)
PROTHROMBIN TIME: 15.6 s — AB (ref 11.6–15.2)
Prothrombin Time: 16 seconds — ABNORMAL HIGH (ref 11.6–15.2)
Prothrombin Time: 15.5 seconds — ABNORMAL HIGH (ref 11.6–15.2)

## 2015-07-29 LAB — APTT: aPTT: 30 seconds (ref 24–37)

## 2015-07-29 MED ORDER — CETYLPYRIDINIUM CHLORIDE 0.05 % MT LIQD
7.0000 mL | Freq: Two times a day (BID) | OROMUCOSAL | Status: DC
Start: 2015-07-29 — End: 2015-07-30
  Administered 2015-07-29 (×2): 7 mL via OROMUCOSAL

## 2015-07-29 MED ORDER — SALINE SPRAY 0.65 % NA SOLN
1.0000 | Freq: Three times a day (TID) | NASAL | Status: DC | PRN
  Filled 2015-07-29: qty 44

## 2015-07-29 NOTE — Progress Notes (Signed)
Wallet taken to security to be locked in safe.

## 2015-07-29 NOTE — Progress Notes (Signed)
No acute events Awake and alert Oriented x3 Eyes swollen shut Follows commands x4 Neurologically stable Given intact neurological exam, no need to repeat CT No further neurosurgical recommendations Feel free to call with questions

## 2015-07-29 NOTE — Progress Notes (Signed)
PULMONARY / CRITICAL CARE MEDICINE   Name: Kyle [name] MRN: [id number] DOB: 1926/05/06    ADMISSION DATE:  07/28/2015  CONSULTATION DATE:  07/28/15  REFERRING MD:  Trauma  CHIEF COMPLAINT:  82 year old with paroxysmal A. Fib on Coumadin, COPD, hypertension, hyperlipidemia, osteoarthritis.admitted after being found down in his house. Patient apparently fell off the steps onto his face.found to have facial, skull fractures, C5 fracture, subarachnoid and intraparenchymal hemorrhage. PCCM called for medical management.  Patient was also admitted in January, 2017 after a fall with a left hip fracture  SUBJECTIVE: No events overnight.  VITAL SIGNS: BP 113/61 mmHg  Pulse 77  Temp(Src) 98.6 F (37 C) (Axillary)  Resp 16  Ht  (1.854 m)  Wt 67.7 kg (149 lb 4 oz)  BMI 19.70 kg/m2  SpO2 94%  HEMODYNAMICS:    VENTILATOR SETTINGS:    INTAKE / OUTPUT: I/O last 3 completed shifts: In: 500 [I.V.:500] Out: 605 [Urine:605]  PHYSICAL EXAMINATION: General:  Somnolent, arousable, face swelling, multiple face deformities, hematoma, periorbital swelling Neuro:  Moves all 4 extremities, no gross focal deficit HEENT:  No JVD, thyromegaly Cardiovascular:  irregular, no murmurs rubs gallops Lungs:  Clear, no wheeze, crackle Abdomen:  Soft , positive bowel sounds Musculoskeletal:  Normal tone and bulk, no edema  LABS:  BMET  Recent Labs Lab 07/28/15 1700 07/28/15 1706 07/29/15 0254  NA 134* 135 135  K 4.5 4.4 4.5  CL 103 101 103  CO2 19*  --  24  BUN 29* 32* 27*  CREATININE 1.33* 1.10 1.20  GLUCOSE 132* 135* 184*    Electrolytes  Recent Labs Lab 07/28/15 1700 07/29/15 0254  CALCIUM 8.9 8.8*    CBC  Recent Labs Lab 07/28/15 1700 07/28/15 1706 07/29/15 0254  WBC 12.8*  --  13.6*  HGB 12.2* 13.6 10.9*  HCT 35.8* 40.0 33.3*  PLT 230  --  228    Coag's  Recent Labs Lab 07/28/15 1700 07/28/15 1920 07/28/15 2028 07/29/15 0122 07/29/15 0254  APTT 25  --   --    --  30  INR 1.92* 1.28 1.35 1.26  --     Sepsis Markers No results for input(s): LATICACIDVEN, PROCALCITON, O2SATVEN in the last 168 hours.  ABG No results for input(s): PHART, PCO2ART, PO2ART in the last 168 hours.  Liver Enzymes  Recent Labs Lab 07/28/15 1700 07/29/15 0254  AST 23 27  ALT 17 19  ALKPHOS 52 50  BILITOT 0.4 0.9  ALBUMIN 3.8 3.5    Cardiac Enzymes  Recent Labs Lab 07/28/15 2028  TROPONINI <0.03    Glucose  Recent Labs Lab 07/28/15 1717  GLUCAP 132*    Imaging Ct Head Wo Contrast  07/28/2015  CLINICAL DATA:  Fall. EXAM: CT HEAD WITHOUT CONTRAST CT MAXILLOFACIAL WITHOUT CONTRAST CT CERVICAL SPINE WITHOUT CONTRAST TECHNIQUE: Multidetector CT imaging of the head, cervical spine, and maxillofacial structures were performed using the standard protocol without intravenous contrast. Multiplanar CT image reconstructions of the cervical spine and maxillofacial structures were also generated. COMPARISON:  CT scan of head of February 09, 2013. FINDINGS: CT HEAD FINDINGS Mildly depressed and comminuted frontal skull fracture is noted. Mild bilateral frontal subarachnoid hemorrhage is noted. Mild diffuse cortical atrophy is noted. Mild chronic ischemic white matter disease is noted. Ventricular size is within normal limits. No midline shift is noted. Possible 1 cm left frontal intraparenchymal hemorrhage is noted. CT MAXILLOFACIAL FINDINGS The mandible appears normal moderately depressed left nasal bone fracture is noted. Mildly  displaced left zygomatic arch fracture is noted, with mildly displaced left posterior maxillary wall fracture. Left pterygoid plate appears normal. Moderately displaced and comminuted fracture is seen involving right pterygoid plate with associated fracture of the posterior wall of the right maxillary sinus. Minimally displaced fracture is seen involving the right lamina papyracea. Moderately displaced right anterior maxillary wall fracture is  noted. Hemorrhage is noted in both maxillary sinuses as well as probably in the ethmoid and sphenoid sinuses. Fractures are seen involving the lateral orbital walls bilaterally. Mildly displaced fractures are seen involving the superior orbital roofs bilaterally. Mildly displaced right orbital floor fracture is noted. Large amount of soft tissue gas is seen in the soft tissues lateral to the right maxillary sinus. CT CERVICAL SPINE FINDINGS No significant spondylolisthesis is noted. Mildly displaced fracture is seen involving the posterior spinous process of C5. Mild degenerative disc disease is noted at C3-4, C5-6 and C6-7. Visualized lung apices are unremarkable. IMPRESSION: Mild diffuse cortical atrophy. Mild chronic ischemic white matter disease. Mild bilateral frontal subarachnoid hemorrhage is noted. Probable 1 cm left frontal intraparenchymal hemorrhage is noted. No midline shift is noted. Small amount of pneumocephalus is noted. Mildly depressed and comminuted bilateral frontal skull fracture is noted. Moderately depressed left nasal bone fracture. Mildly displaced left zygomatic arch fracture. Mildly displaced left posterior maxillary wall fracture. Moderately displaced and comminuted fracture is seen involving the posterior wall of the right maxillary sinus and the right pterygoid plate. Hemorrhage is noted in both maxillary, ethmoid and sphenoid sinuses. Minimally displaced fracture seen involving the right lamina papyracea. Moderately displaced right anterior maxillary wall fracture is noted. Mildly displaced fractures are seen involving the superior orbital roofs bilaterally. Mildly displaced right orbital floor fracture is noted. Mildly displaced bilateral lateral orbital wall fractures. Mildly displaced fracture is seen involving posterior spinous process of C5. Multilevel degenerative disc disease is noted in the cervical spine. No significant spondylolisthesis is noted. Critical Value/emergent  results were called by telephone at the time of interpretation on 07/28/2015 at 5:43 pm to Dr. Bary Castilla , who verbally acknowledged these results. Electronically Signed   By: Lupita Raider, M.D.   On: 07/28/2015 17:44   Ct Cervical Spine Wo Contrast  07/28/2015  CLINICAL DATA:  Fall. EXAM: CT HEAD WITHOUT CONTRAST CT MAXILLOFACIAL WITHOUT CONTRAST CT CERVICAL SPINE WITHOUT CONTRAST TECHNIQUE: Multidetector CT imaging of the head, cervical spine, and maxillofacial structures were performed using the standard protocol without intravenous contrast. Multiplanar CT image reconstructions of the cervical spine and maxillofacial structures were also generated. COMPARISON:  CT scan of head of February 09, 2013. FINDINGS: CT HEAD FINDINGS Mildly depressed and comminuted frontal skull fracture is noted. Mild bilateral frontal subarachnoid hemorrhage is noted. Mild diffuse cortical atrophy is noted. Mild chronic ischemic white matter disease is noted. Ventricular size is within normal limits. No midline shift is noted. Possible 1 cm left frontal intraparenchymal hemorrhage is noted. CT MAXILLOFACIAL FINDINGS The mandible appears normal moderately depressed left nasal bone fracture is noted. Mildly displaced left zygomatic arch fracture is noted, with mildly displaced left posterior maxillary wall fracture. Left pterygoid plate appears normal. Moderately displaced and comminuted fracture is seen involving right pterygoid plate with associated fracture of the posterior wall of the right maxillary sinus. Minimally displaced fracture is seen involving the right lamina papyracea. Moderately displaced right anterior maxillary wall fracture is noted. Hemorrhage is noted in both maxillary sinuses as well as probably in the ethmoid and sphenoid sinuses. Fractures are seen involving  the lateral orbital walls bilaterally. Mildly displaced fractures are seen involving the superior orbital roofs bilaterally. Mildly displaced  right orbital floor fracture is noted. Large amount of soft tissue gas is seen in the soft tissues lateral to the right maxillary sinus. CT CERVICAL SPINE FINDINGS No significant spondylolisthesis is noted. Mildly displaced fracture is seen involving the posterior spinous process of C5. Mild degenerative disc disease is noted at C3-4, C5-6 and C6-7. Visualized lung apices are unremarkable. IMPRESSION: Mild diffuse cortical atrophy. Mild chronic ischemic white matter disease. Mild bilateral frontal subarachnoid hemorrhage is noted. Probable 1 cm left frontal intraparenchymal hemorrhage is noted. No midline shift is noted. Small amount of pneumocephalus is noted. Mildly depressed and comminuted bilateral frontal skull fracture is noted. Moderately depressed left nasal bone fracture. Mildly displaced left zygomatic arch fracture. Mildly displaced left posterior maxillary wall fracture. Moderately displaced and comminuted fracture is seen involving the posterior wall of the right maxillary sinus and the right pterygoid plate. Hemorrhage is noted in both maxillary, ethmoid and sphenoid sinuses. Minimally displaced fracture seen involving the right lamina papyracea. Moderately displaced right anterior maxillary wall fracture is noted. Mildly displaced fractures are seen involving the superior orbital roofs bilaterally. Mildly displaced right orbital floor fracture is noted. Mildly displaced bilateral lateral orbital wall fractures. Mildly displaced fracture is seen involving posterior spinous process of C5. Multilevel degenerative disc disease is noted in the cervical spine. No significant spondylolisthesis is noted. Critical Value/emergent results were called by telephone at the time of interpretation on 07/28/2015 at 5:43 pm to Dr. Bary Castilla , who verbally acknowledged these results. Electronically Signed   By: Lupita Raider, M.D.   On: 07/28/2015 17:44   Dg Hand 2 View Left  07/28/2015  CLINICAL DATA:  Larey Seat  today, pain at MCP joints, bruising EXAM: LEFT HAND - 2 VIEW COMPARISON:  LEFT wrist radiographs 05/19/2014 FINDINGS: Diffuse osseous demineralization. Scattered degenerative changes of interphalangeal joints. Osteolysis versus resection of trapezium. Degenerative changes first and second MCP joints. No acute fracture, dislocation, or bone destruction. Soft tissue swelling at distal phalanx index finger. Corticated ossicle noted dorsal to mid carpals appear present since 2016. Additional osseous density dorsal to the distal carpal row, seen only on lateral view, was not definitely visualized previously and is indeterminate in age. IMPRESSION: Osseous demineralization with scattered degenerative changes as above. Additional calcific density is seen dorsal to the distal carpal row on lateral view new since prior exam, age-indeterminate ; correlation for pain/tenderness at this site recommended to exclude acute fracture. Electronically Signed   By: Ulyses Southward M.D.   On: 07/28/2015 18:39   Dg Chest Portable 1 View  07/28/2015  CLINICAL DATA:  82 year old male with fall. EXAM: PORTABLE CHEST 1 VIEW COMPARISON:  07/28/2015 and prior radiographs FINDINGS: The cardiomediastinal silhouette is unremarkable. Right lower lung atelectasis versus airspace disease noted. There is no evidence of pulmonary edema, suspicious pulmonary nodule/mass, pleural effusion, or pneumothorax. No acute bony abnormalities are identified. IMPRESSION: Right lower lung atelectasis versus airspace disease. No evidence of pneumothorax. Electronically Signed   By: Harmon Pier M.D.   On: 07/28/2015 18:32   Dg Chest Portable 1 View  07/28/2015  CLINICAL DATA:  Fall EXAM: PORTABLE CHEST 1 VIEW COMPARISON:  05/18/2014 chest radiograph. FINDINGS: A portion of the costophrenic angles is excluded from the image. Stable cardiomediastinal silhouette with normal heart size. There is a nonspecific lucency at the right lung base, cannot exclude a small  basilar right pneumothorax. No left  pneumothorax. No pleural effusion. Hyperinflated lungs. Emphysema. No pulmonary edema. Mild curvilinear opacities at the right lung base. Stable small granuloma in the left mid lung. No displaced fractures in the visualized chest, noting limited visualization of the right lower ribs due to overlying wires. IMPRESSION: 1. Nonspecific lucency at the right lung base, cannot exclude a small basilar right pneumothorax. Consider further evaluation with repeat upright PA and lateral chest radiographs and/or chest CT as clinically warranted . 2. Hyperinflated lungs and emphysema, suggesting COPD. Mild curvilinear opacity at the right lung base, probably atelectasis or scarring. These results were called by telephone at the time of interpretation on 07/28/2015 at 5:55 pm to Dr. Bary Castilla , who verbally acknowledged these results. Electronically Signed   By: Delbert Phenix M.D.   On: 07/28/2015 17:56   Ct Maxillofacial Wo Cm  07/28/2015  CLINICAL DATA:  Fall. EXAM: CT HEAD WITHOUT CONTRAST CT MAXILLOFACIAL WITHOUT CONTRAST CT CERVICAL SPINE WITHOUT CONTRAST TECHNIQUE: Multidetector CT imaging of the head, cervical spine, and maxillofacial structures were performed using the standard protocol without intravenous contrast. Multiplanar CT image reconstructions of the cervical spine and maxillofacial structures were also generated. COMPARISON:  CT scan of head of February 09, 2013. FINDINGS: CT HEAD FINDINGS Mildly depressed and comminuted frontal skull fracture is noted. Mild bilateral frontal subarachnoid hemorrhage is noted. Mild diffuse cortical atrophy is noted. Mild chronic ischemic white matter disease is noted. Ventricular size is within normal limits. No midline shift is noted. Possible 1 cm left frontal intraparenchymal hemorrhage is noted. CT MAXILLOFACIAL FINDINGS The mandible appears normal moderately depressed left nasal bone fracture is noted. Mildly displaced left  zygomatic arch fracture is noted, with mildly displaced left posterior maxillary wall fracture. Left pterygoid plate appears normal. Moderately displaced and comminuted fracture is seen involving right pterygoid plate with associated fracture of the posterior wall of the right maxillary sinus. Minimally displaced fracture is seen involving the right lamina papyracea. Moderately displaced right anterior maxillary wall fracture is noted. Hemorrhage is noted in both maxillary sinuses as well as probably in the ethmoid and sphenoid sinuses. Fractures are seen involving the lateral orbital walls bilaterally. Mildly displaced fractures are seen involving the superior orbital roofs bilaterally. Mildly displaced right orbital floor fracture is noted. Large amount of soft tissue gas is seen in the soft tissues lateral to the right maxillary sinus. CT CERVICAL SPINE FINDINGS No significant spondylolisthesis is noted. Mildly displaced fracture is seen involving the posterior spinous process of C5. Mild degenerative disc disease is noted at C3-4, C5-6 and C6-7. Visualized lung apices are unremarkable. IMPRESSION: Mild diffuse cortical atrophy. Mild chronic ischemic white matter disease. Mild bilateral frontal subarachnoid hemorrhage is noted. Probable 1 cm left frontal intraparenchymal hemorrhage is noted. No midline shift is noted. Small amount of pneumocephalus is noted. Mildly depressed and comminuted bilateral frontal skull fracture is noted. Moderately depressed left nasal bone fracture. Mildly displaced left zygomatic arch fracture. Mildly displaced left posterior maxillary wall fracture. Moderately displaced and comminuted fracture is seen involving the posterior wall of the right maxillary sinus and the right pterygoid plate. Hemorrhage is noted in both maxillary, ethmoid and sphenoid sinuses. Minimally displaced fracture seen involving the right lamina papyracea. Moderately displaced right anterior maxillary wall  fracture is noted. Mildly displaced fractures are seen involving the superior orbital roofs bilaterally. Mildly displaced right orbital floor fracture is noted. Mildly displaced bilateral lateral orbital wall fractures. Mildly displaced fracture is seen involving posterior spinous process of C5. Multilevel degenerative disc  disease is noted in the cervical spine. No significant spondylolisthesis is noted. Critical Value/emergent results were called by telephone at the time of interpretation on 07/28/2015 at 5:43 pm to Dr. Bary CastillaOURTENEY MACKUEN , who verbally acknowledged these results. Electronically Signed   By: Lupita RaiderJames  Green Jr, M.D.   On: 07/28/2015 17:44    STUDIES:  CT head 3/31 > facial, skull fractures, C5 fracture, subarachnoid and intraparenchymal hemorrhage. CXR 3/31 > Rt basal opacity  CULTURES:  ANTIBIOTICS:  SIGNIFICANT EVENTS: 3/31 > Admit after fall.  LINES/TUBES:  I reviewed CXR myself, RLL atelectasis but no other abnormalities.  DISCUSSION: 82 year old with atrial fibrillation on Coumadin admitted with a fall. Found to have multiple facial, skull fractures with, to brain injury and intracranial hemorrhage. INR was elevated on admission but reversed after Kcentra and Vitk. Pt is stable and protecting the airway. He will likely need to be off coumadin permanently as he has recurrent falls.   PLAN: - Monitor respiratory status - No need for further blood products as INR has normalized - Hold Coumadin - Afib is rate controlled. Use lopressor IV as needed. Holding atenolol and Dig as pt is NPO - PRN fentanyl for pain  PCCM will sign off, please call back if needed.  Discussed with trauma MD.  Alyson ReedyWesam G. Yacoub, M.D. [facility]. Pager: [phone]. After hours pager: [phone].  07/29/2015, 8:30 AM
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
## 2015-07-29 NOTE — Progress Notes (Signed)
    Subjective:  Denies CP or dyspnea   Objective:  Filed Vitals:   07/29/15 1100 07/29/15 1155 07/29/15 1200 07/29/15 1201  BP: 109/69  112/59   Pulse: 67  78   Temp:  99.3 F (37.4 C)    TempSrc:  Axillary    Resp: 18  26   Height:      Weight:      SpO2: 95%  95% 94%    Intake/Output from previous day:  Intake/Output Summary (Last 24 hours) at 07/29/15 1224 Last data filed at 07/29/15 1000  Gross per 24 hour  Intake    650 ml  Output    755 ml  Net   -105 ml    Physical Exam: Physical exam: Well-developed well-nourished in no acute distress.  Skin is warm and dry.  HEENT facial trauma Neck is supple.  Chest is clear to auscultation with normal expansion.  Cardiovascular exam is irregular Abdominal exam nontender or distended. No masses palpated. Extremities show no edema. neuro grossly intact    Lab Results: Basic Metabolic Panel:  Recent Labs  16/01/9602/31/17 1700 07/28/15 1706 07/29/15 0254  NA 134* 135 135  K 4.5 4.4 4.5  CL 103 101 103  CO2 19*  --  24  GLUCOSE 132* 135* 184*  BUN 29* 32* 27*  CREATININE 1.33* 1.10 1.20  CALCIUM 8.9  --  8.8*   CBC:  Recent Labs  07/28/15 1700 07/28/15 1706 07/29/15 0254  WBC 12.8*  --  13.6*  NEUTROABS 7.6  --   --   HGB 12.2* 13.6 10.9*  HCT 35.8* 40.0 33.3*  MCV 92.5  --  91.7  PLT 230  --  228   Cardiac Enzymes:  Recent Labs  07/28/15 2028  TROPONINI <0.03     Assessment/Plan:  1 atrial fibrillation-continue atenolol for rate control. Anticoagulation is on hold because of trauma. Once he recovers it would be worthwhile to consider a watchman. 2 facial trauma-management per trauma. 3 hyperlipidemia-continue statin.  Kyle MillersBrian Lynnetta [name] 07/29/2015, 12:24 PM

## 2015-07-29 NOTE — Progress Notes (Signed)
Interval History:                                                                                                                      Kyle [name] is an 82 y.o. male patient with posttraumatic arachnoid hemorrhage and multiple facial fractures as described in my consultation note. He is on Keppra 500 mg twice a day empirically for seizure prophylaxis given the traumatic head injury with sever chronic hemorrhage.  He remains stable from neurological standpoint, no new neurological symptoms. His slightly drowsy from the when necessary pain medications. His wife was at bedside. His wife is known to have dementia, she is a former Engineer, civil (consulting) at the [employer].   Past Medical History: Past Medical History  Diagnosis Date  . PAF (paroxysmal atrial fibrillation) (HCC)     Managed with rate control and coumadin  . COPD (chronic obstructive pulmonary disease) (HCC)   . HTN (hypertension)   . Hyperlipemia   . OA (osteoarthritis)   . Chronic anticoagulation   . Incomplete rotator cuff tear or rupture of right shoulder, not specified as traumatic 2012  . Chronic anticoagulation   . Nocturia   . Osteoarthritis of right hip 03/31/2012  . Shortness of breath     Past Surgical History  Procedure Laterality Date  . Total hip arthroplasty  1998  . Shoulder arthroscopy  1994    RIGHT  . Cholecystectomy    . Appendectomy    . Cataract extraction w/ intraocular lens  implant, bilateral    . Right knee arthroscopy    . Total hip arthroplasty  03/31/2012    RIGHT HIP  . Total hip arthroplasty  03/31/2012    Procedure: TOTAL HIP ARTHROPLASTY;  Surgeon: Eulas Post, MD;  Location: [facility] OR;  Service: Orthopedics;  Laterality: Right;    Family History: Family History  Problem Relation Age of Onset  . Cancer Father   . Cancer Mother   . Other Brother     SUICIDE    Social History:   reports that he has been smoking Pipe.  He has never used smokeless tobacco. He reports that he does not drink  alcohol or use illicit drugs.  Allergies:  Allergies  Allergen Reactions  . Advair Diskus [Fluticasone-Salmeterol] Other (See Comments)    Unknown reaction per son  . Dilaudid [Hydromorphone Hcl] Other (See Comments)    Makes patient very loopy and disoriented  . Morphine And Related Other (See Comments)    Loopy and disoriented     Medications:  Current facility-administered medications:  .  0.9 %  sodium chloride infusion, , Intravenous, Once, Harriette Bouillon, MD .  alfuzosin (UROXATRAL) 24 hr tablet 10 mg, 10 mg, Oral, BID, Harriette Bouillon, MD, 10 mg at 07/28/15 2106 .  antiseptic oral rinse (CPC / CETYLPYRIDINIUM CHLORIDE 0.05%) solution 7 mL, 7 mL, Mouth Rinse, BID, Almond Lint, MD, 7 mL at 07/29/15 1000 .  atenolol (TENORMIN) tablet 25 mg, 25 mg, Oral, Daily, Harriette Bouillon, MD, 25 mg at 07/28/15 2206 .  atorvastatin (LIPITOR) tablet 10 mg, 10 mg, Oral, Daily, Harriette Bouillon, MD, 10 mg at 07/28/15 2207 .  dextrose 5 % and 0.9 % NaCl with KCl 20 mEq/L infusion, , Intravenous, Continuous, Harriette Bouillon, MD, Last Rate: 50 mL/hr at 07/29/15 1523, 50 mL/hr at 07/29/15 1523 .  fentaNYL (SUBLIMAZE) injection 50 mcg, 50 mcg, Intravenous, Q1H PRN, Harriette Bouillon, MD, 50 mcg at 07/29/15 1513 .  levETIRAcetam (KEPPRA) 500 mg in sodium chloride 0.9 % 100 mL IVPB, 500 mg, Intravenous, Q12H, [place] Daniel Nones, MD, Last Rate: 420 mL/hr at 07/29/15 1857, 500 mg at 07/29/15 1857 .  metoprolol (LOPRESSOR) injection 2.5-5 mg, 2.5-5 mg, Intravenous, Q3H PRN, Chilton Greathouse, MD .  neomycin-bacitracin-polymyxin (NEOSPORIN) ointment, , Topical, TID, Melvenia Beam, MD, 1 application at 07/29/15 1522 .  ondansetron (ZOFRAN) tablet 4 mg, 4 mg, Oral, Q6H PRN **OR** ondansetron (ZOFRAN) injection 4 mg, 4 mg, Intravenous, Q6H PRN, Harriette Bouillon, MD, 4 mg at 07/29/15 1257 .   pantoprazole (PROTONIX) EC tablet 40 mg, 40 mg, Oral, Daily, 40 mg at 07/28/15 2106 **OR** pantoprazole (PROTONIX) injection 40 mg, 40 mg, Intravenous, Daily, Harriette Bouillon, MD, 40 mg at 07/29/15 1005 .  sodium chloride (OCEAN) 0.65 % nasal spray 1 spray, 1 spray, Each Nare, Q8H PRN, Melvenia Beam, MD .  tiotropium [facility]) inhalation capsule 18 mcg, 18 mcg, Inhalation, Daily, Harriette Bouillon, MD, 18 mcg at 07/29/15 1201   Neurologic Examination:                                                                                                     Today's Vitals   07/29/15 1530 07/29/15 1600 07/29/15 1700 07/29/15 1800  BP:  112/58 109/68 112/63  Pulse:  82 71 68  Temp:      TempSrc:      Resp:  20 16 21   Height:      Weight:      SpO2:  94% 92% 94%  PainSc: 0-No pain       Level of alertness: Drowsy from the fentanyl given few minutes prior  Oriented to time, place and person Speech: fluent, no evidence of dysarthria or aphasia noted. Follows commands easily.  Able to elevate all 4 extremities antigravity, no drift. No abnormal involuntary movements noted  Lab Results: Basic Metabolic Panel:  Recent Labs Lab 07/28/15 1700 07/28/15 1706 07/29/15 0254  NA 134* 135 135  K 4.5 4.4 4.5  CL 103 101 103  CO2 19*  --  24  GLUCOSE 132* 135* 184*  BUN 29* 32* 27*  CREATININE 1.33* 1.10 1.20  CALCIUM 8.9  --  8.8*    Liver Function Tests:  Recent Labs Lab 07/28/15 1700 07/29/15 0254  AST 23 27  ALT 17 19  ALKPHOS 52 50  BILITOT 0.4 0.9  PROT 6.4* 6.2*  ALBUMIN 3.8 3.5   No results for input(s): LIPASE, AMYLASE in the last 168 hours. No results for input(s): AMMONIA in the last 168 hours.  CBC:  Recent Labs Lab 07/28/15 1700 07/28/15 1706 07/29/15 0254  WBC 12.8*  --  13.6*  NEUTROABS 7.6  --   --   HGB 12.2* 13.6 10.9*  HCT 35.8* 40.0 33.3*  MCV 92.5  --  91.7  PLT 230  --  228    Cardiac Enzymes:  Recent Labs Lab 07/28/15 2028  TROPONINI <0.03     Lipid Panel: No results for input(s): CHOL, TRIG, HDL, CHOLHDL, VLDL, LDLCALC in the last 168 hours.  CBG:  Recent Labs Lab 07/28/15 1717  GLUCAP 132*    Microbiology: Results for orders placed or performed during the hospital encounter of 01/22/15  Urine culture     Status: None   Collection Time: 01/22/15  9:15 AM  Result Value Ref Range Status   Specimen Description URINE, RANDOM  Final   Special Requests NONE  Final   Culture >=100,000 COLONIES/mL PROTEUS MIRABILIS  Final   Report Status 01/24/2015 FINAL  Final   Organism ID, Bacteria PROTEUS MIRABILIS  Final      Susceptibility   Proteus mirabilis - MIC*    AMPICILLIN <=2 SENSITIVE Sensitive     CEFAZOLIN <=4 SENSITIVE Sensitive     CEFTRIAXONE <=1 SENSITIVE Sensitive     CIPROFLOXACIN <=0.25 SENSITIVE Sensitive     GENTAMICIN <=1 SENSITIVE Sensitive     IMIPENEM 4 SENSITIVE Sensitive     NITROFURANTOIN 128 RESISTANT Resistant     TRIMETH/SULFA <=20 SENSITIVE Sensitive     AMPICILLIN/SULBACTAM <=2 SENSITIVE Sensitive     PIP/TAZO <=4 SENSITIVE Sensitive     * >=100,000 COLONIES/mL PROTEUS MIRABILIS    Imaging: Ct Head Wo Contrast  07/28/2015  CLINICAL DATA:  Fall. EXAM: CT HEAD WITHOUT CONTRAST CT MAXILLOFACIAL WITHOUT CONTRAST CT CERVICAL SPINE WITHOUT CONTRAST TECHNIQUE: Multidetector CT imaging of the head, cervical spine, and maxillofacial structures were performed using the standard protocol without intravenous contrast. Multiplanar CT image reconstructions of the cervical spine and maxillofacial structures were also generated. COMPARISON:  CT scan of head of February 09, 2013. FINDINGS: CT HEAD FINDINGS Mildly depressed and comminuted frontal skull fracture is noted. Mild bilateral frontal subarachnoid hemorrhage is noted. Mild diffuse cortical atrophy is noted. Mild chronic ischemic white matter disease is noted. Ventricular size is within normal limits. No midline shift is noted. Possible 1 cm left frontal  intraparenchymal hemorrhage is noted. CT MAXILLOFACIAL FINDINGS The mandible appears normal moderately depressed left nasal bone fracture is noted. Mildly displaced left zygomatic arch fracture is noted, with mildly displaced left posterior maxillary wall fracture. Left pterygoid plate appears normal. Moderately displaced and comminuted fracture is seen involving right pterygoid plate with associated fracture of the posterior wall of the right maxillary sinus. Minimally displaced fracture is seen involving the right lamina papyracea. Moderately displaced right anterior maxillary wall fracture is noted. Hemorrhage is noted in both maxillary sinuses as well as probably in the ethmoid and sphenoid sinuses. Fractures are seen involving the lateral orbital walls bilaterally. Mildly displaced fractures are seen involving the superior orbital roofs bilaterally. Mildly displaced right orbital floor fracture is noted. Large amount of  soft tissue gas is seen in the soft tissues lateral to the right maxillary sinus. CT CERVICAL SPINE FINDINGS No significant spondylolisthesis is noted. Mildly displaced fracture is seen involving the posterior spinous process of C5. Mild degenerative disc disease is noted at C3-4, C5-6 and C6-7. Visualized lung apices are unremarkable. IMPRESSION: Mild diffuse cortical atrophy. Mild chronic ischemic white matter disease. Mild bilateral frontal subarachnoid hemorrhage is noted. Probable 1 cm left frontal intraparenchymal hemorrhage is noted. No midline shift is noted. Small amount of pneumocephalus is noted. Mildly depressed and comminuted bilateral frontal skull fracture is noted. Moderately depressed left nasal bone fracture. Mildly displaced left zygomatic arch fracture. Mildly displaced left posterior maxillary wall fracture. Moderately displaced and comminuted fracture is seen involving the posterior wall of the right maxillary sinus and the right pterygoid plate. Hemorrhage is noted in both  maxillary, ethmoid and sphenoid sinuses. Minimally displaced fracture seen involving the right lamina papyracea. Moderately displaced right anterior maxillary wall fracture is noted. Mildly displaced fractures are seen involving the superior orbital roofs bilaterally. Mildly displaced right orbital floor fracture is noted. Mildly displaced bilateral lateral orbital wall fractures. Mildly displaced fracture is seen involving posterior spinous process of C5. Multilevel degenerative disc disease is noted in the cervical spine. No significant spondylolisthesis is noted. Critical Value/emergent results were called by telephone at the time of interpretation on 07/28/2015 at 5:43 pm to Dr. Bary Castilla , who verbally acknowledged these results. Electronically Signed   By: Lupita Raider, M.D.   On: 07/28/2015 17:44   Ct Cervical Spine Wo Contrast  07/28/2015  CLINICAL DATA:  Fall. EXAM: CT HEAD WITHOUT CONTRAST CT MAXILLOFACIAL WITHOUT CONTRAST CT CERVICAL SPINE WITHOUT CONTRAST TECHNIQUE: Multidetector CT imaging of the head, cervical spine, and maxillofacial structures were performed using the standard protocol without intravenous contrast. Multiplanar CT image reconstructions of the cervical spine and maxillofacial structures were also generated. COMPARISON:  CT scan of head of February 09, 2013. FINDINGS: CT HEAD FINDINGS Mildly depressed and comminuted frontal skull fracture is noted. Mild bilateral frontal subarachnoid hemorrhage is noted. Mild diffuse cortical atrophy is noted. Mild chronic ischemic white matter disease is noted. Ventricular size is within normal limits. No midline shift is noted. Possible 1 cm left frontal intraparenchymal hemorrhage is noted. CT MAXILLOFACIAL FINDINGS The mandible appears normal moderately depressed left nasal bone fracture is noted. Mildly displaced left zygomatic arch fracture is noted, with mildly displaced left posterior maxillary wall fracture. Left pterygoid plate  appears normal. Moderately displaced and comminuted fracture is seen involving right pterygoid plate with associated fracture of the posterior wall of the right maxillary sinus. Minimally displaced fracture is seen involving the right lamina papyracea. Moderately displaced right anterior maxillary wall fracture is noted. Hemorrhage is noted in both maxillary sinuses as well as probably in the ethmoid and sphenoid sinuses. Fractures are seen involving the lateral orbital walls bilaterally. Mildly displaced fractures are seen involving the superior orbital roofs bilaterally. Mildly displaced right orbital floor fracture is noted. Large amount of soft tissue gas is seen in the soft tissues lateral to the right maxillary sinus. CT CERVICAL SPINE FINDINGS No significant spondylolisthesis is noted. Mildly displaced fracture is seen involving the posterior spinous process of C5. Mild degenerative disc disease is noted at C3-4, C5-6 and C6-7. Visualized lung apices are unremarkable. IMPRESSION: Mild diffuse cortical atrophy. Mild chronic ischemic white matter disease. Mild bilateral frontal subarachnoid hemorrhage is noted. Probable 1 cm left frontal intraparenchymal hemorrhage is noted. No midline shift is  noted. Small amount of pneumocephalus is noted. Mildly depressed and comminuted bilateral frontal skull fracture is noted. Moderately depressed left nasal bone fracture. Mildly displaced left zygomatic arch fracture. Mildly displaced left posterior maxillary wall fracture. Moderately displaced and comminuted fracture is seen involving the posterior wall of the right maxillary sinus and the right pterygoid plate. Hemorrhage is noted in both maxillary, ethmoid and sphenoid sinuses. Minimally displaced fracture seen involving the right lamina papyracea. Moderately displaced right anterior maxillary wall fracture is noted. Mildly displaced fractures are seen involving the superior orbital roofs bilaterally. Mildly displaced  right orbital floor fracture is noted. Mildly displaced bilateral lateral orbital wall fractures. Mildly displaced fracture is seen involving posterior spinous process of C5. Multilevel degenerative disc disease is noted in the cervical spine. No significant spondylolisthesis is noted. Critical Value/emergent results were called by telephone at the time of interpretation on 07/28/2015 at 5:43 pm to Dr. Bary CastillaOURTENEY MACKUEN , who verbally acknowledged these results. Electronically Signed   By: Lupita RaiderJames  Green Jr, M.D.   On: 07/28/2015 17:44   Dg Hand 2 View Left  07/28/2015  CLINICAL DATA:  Larey SeatFell today, pain at MCP joints, bruising EXAM: LEFT HAND - 2 VIEW COMPARISON:  LEFT wrist radiographs 05/19/2014 FINDINGS: Diffuse osseous demineralization. Scattered degenerative changes of interphalangeal joints. Osteolysis versus resection of trapezium. Degenerative changes first and second MCP joints. No acute fracture, dislocation, or bone destruction. Soft tissue swelling at distal phalanx index finger. Corticated ossicle noted dorsal to mid carpals appear present since 2016. Additional osseous density dorsal to the distal carpal row, seen only on lateral view, was not definitely visualized previously and is indeterminate in age. IMPRESSION: Osseous demineralization with scattered degenerative changes as above. Additional calcific density is seen dorsal to the distal carpal row on lateral view new since prior exam, age-indeterminate ; correlation for pain/tenderness at this site recommended to exclude acute fracture. Electronically Signed   By: Ulyses SouthwardMark  Boles M.D.   On: 07/28/2015 18:39   Dg Chest Portable 1 View  07/28/2015  CLINICAL DATA:  82 year old male with fall. EXAM: PORTABLE CHEST 1 VIEW COMPARISON:  07/28/2015 and prior radiographs FINDINGS: The cardiomediastinal silhouette is unremarkable. Right lower lung atelectasis versus airspace disease noted. There is no evidence of pulmonary edema, suspicious pulmonary  nodule/mass, pleural effusion, or pneumothorax. No acute bony abnormalities are identified. IMPRESSION: Right lower lung atelectasis versus airspace disease. No evidence of pneumothorax. Electronically Signed   By: Harmon PierJeffrey  Hu M.D.   On: 07/28/2015 18:32   Dg Chest Portable 1 View  07/28/2015  CLINICAL DATA:  Fall EXAM: PORTABLE CHEST 1 VIEW COMPARISON:  05/18/2014 chest radiograph. FINDINGS: A portion of the costophrenic angles is excluded from the image. Stable cardiomediastinal silhouette with normal heart size. There is a nonspecific lucency at the right lung base, cannot exclude a small basilar right pneumothorax. No left pneumothorax. No pleural effusion. Hyperinflated lungs. Emphysema. No pulmonary edema. Mild curvilinear opacities at the right lung base. Stable small granuloma in the left mid lung. No displaced fractures in the visualized chest, noting limited visualization of the right lower ribs due to overlying wires. IMPRESSION: 1. Nonspecific lucency at the right lung base, cannot exclude a small basilar right pneumothorax. Consider further evaluation with repeat upright PA and lateral chest radiographs and/or chest CT as clinically warranted . 2. Hyperinflated lungs and emphysema, suggesting COPD. Mild curvilinear opacity at the right lung base, probably atelectasis or scarring. These results were called by telephone at the time of interpretation on 07/28/2015  at 5:55 pm to Dr. Bary Castilla , who verbally acknowledged these results. Electronically Signed   By: Kyle Phenix M.D.   On: 07/28/2015 17:56   Ct Maxillofacial Wo Cm  07/28/2015  CLINICAL DATA:  Fall. EXAM: CT HEAD WITHOUT CONTRAST CT MAXILLOFACIAL WITHOUT CONTRAST CT CERVICAL SPINE WITHOUT CONTRAST TECHNIQUE: Multidetector CT imaging of the head, cervical spine, and maxillofacial structures were performed using the standard protocol without intravenous contrast. Multiplanar CT image reconstructions of the cervical spine and  maxillofacial structures were also generated. COMPARISON:  CT scan of head of February 09, 2013. FINDINGS: CT HEAD FINDINGS Mildly depressed and comminuted frontal skull fracture is noted. Mild bilateral frontal subarachnoid hemorrhage is noted. Mild diffuse cortical atrophy is noted. Mild chronic ischemic white matter disease is noted. Ventricular size is within normal limits. No midline shift is noted. Possible 1 cm left frontal intraparenchymal hemorrhage is noted. CT MAXILLOFACIAL FINDINGS The mandible appears normal moderately depressed left nasal bone fracture is noted. Mildly displaced left zygomatic arch fracture is noted, with mildly displaced left posterior maxillary wall fracture. Left pterygoid plate appears normal. Moderately displaced and comminuted fracture is seen involving right pterygoid plate with associated fracture of the posterior wall of the right maxillary sinus. Minimally displaced fracture is seen involving the right lamina papyracea. Moderately displaced right anterior maxillary wall fracture is noted. Hemorrhage is noted in both maxillary sinuses as well as probably in the ethmoid and sphenoid sinuses. Fractures are seen involving the lateral orbital walls bilaterally. Mildly displaced fractures are seen involving the superior orbital roofs bilaterally. Mildly displaced right orbital floor fracture is noted. Large amount of soft tissue gas is seen in the soft tissues lateral to the right maxillary sinus. CT CERVICAL SPINE FINDINGS No significant spondylolisthesis is noted. Mildly displaced fracture is seen involving the posterior spinous process of C5. Mild degenerative disc disease is noted at C3-4, C5-6 and C6-7. Visualized lung apices are unremarkable. IMPRESSION: Mild diffuse cortical atrophy. Mild chronic ischemic white matter disease. Mild bilateral frontal subarachnoid hemorrhage is noted. Probable 1 cm left frontal intraparenchymal hemorrhage is noted. No midline shift is noted.  Small amount of pneumocephalus is noted. Mildly depressed and comminuted bilateral frontal skull fracture is noted. Moderately depressed left nasal bone fracture. Mildly displaced left zygomatic arch fracture. Mildly displaced left posterior maxillary wall fracture. Moderately displaced and comminuted fracture is seen involving the posterior wall of the right maxillary sinus and the right pterygoid plate. Hemorrhage is noted in both maxillary, ethmoid and sphenoid sinuses. Minimally displaced fracture seen involving the right lamina papyracea. Moderately displaced right anterior maxillary wall fracture is noted. Mildly displaced fractures are seen involving the superior orbital roofs bilaterally. Mildly displaced right orbital floor fracture is noted. Mildly displaced bilateral lateral orbital wall fractures. Mildly displaced fracture is seen involving posterior spinous process of C5. Multilevel degenerative disc disease is noted in the cervical spine. No significant spondylolisthesis is noted. Critical Value/emergent results were called by telephone at the time of interpretation on 07/28/2015 at 5:43 pm to Dr. Bary Castilla , who verbally acknowledged these results. Electronically Signed   By: Lupita Raider, M.D.   On: 07/28/2015 17:44    Assessment and plan:   Kyle [name] is an 82 y.o. male patient with posttraumatic subarachnoid hemorrhage and multiple facial fractures as described above. Been stable from neurological standpoint, no new symptoms, no seizures. On Keppra 500 twice a day empirically for seizure prophylaxis given the traumatic head injury and subarachnoid hemorrhage. No side  effects from medication. Recommend to continue the same for now. No new neurological issues to address at this time.  We'll sign off. Please call if any further questions.

## 2015-07-29 NOTE — Progress Notes (Signed)
MRSA swab not completed due to nasal fractures.

## 2015-07-30 LAB — PROTIME-INR
INR: 1.18 (ref 0.00–1.49)
PROTHROMBIN TIME: 15.2 s (ref 11.6–15.2)

## 2015-07-30 MED ORDER — CETYLPYRIDINIUM CHLORIDE 0.05 % MT LIQD
7.0000 mL | Freq: Two times a day (BID) | OROMUCOSAL | Status: DC
  Administered 2015-07-30 – 2015-08-03 (×7): 7 mL via OROMUCOSAL

## 2015-07-30 MED ORDER — CHLORHEXIDINE GLUCONATE 0.12 % MT SOLN
15.0000 mL | Freq: Two times a day (BID) | OROMUCOSAL | Status: DC
  Administered 2015-07-30 – 2015-08-03 (×7): 15 mL via OROMUCOSAL
  Filled 2015-07-30 (×8): qty 15

## 2015-07-30 NOTE — Progress Notes (Signed)
Subjective: More awake today     Objective: Vital signs in last 24 hours: Temp:  [98.1 F (36.7 C)-99.3 F (37.4 C)] 98.1 F (36.7 C) (04/02 0800) Pulse Rate:  [52-92] 88 (04/02 1100) Resp:  [0-26] 16 (04/02 1100) BP: (104-128)/(54-84) 124/58 mmHg (04/02 1100) SpO2:  [89 %-100 %] 95 % (04/02 1100) FiO2 (%):  [40 %] 40 % (04/01 1523) Last BM Date:  (pta)  Intake/Output from previous day: 04/01 0701 - 04/02 0700 In: 1305 [I.V.:1200; IV Piggyback:105] Out: 975 [Urine:975] Intake/Output this shift: Total I/O In: 200 [I.V.:200] Out: 155 [Urine:155]  Head: bruising noted Resp: clear to auscultation bilaterally GI: soft, non-tender; bowel sounds normal; no masses,  no organomegaly Neurologic: Mental status: alertness: lethargic Sensory: normal Motor: grossly normal  Lab Results:   Recent Labs  07/28/15 1700 07/28/15 1706 07/29/15 0254  WBC 12.8*  --  13.6*  HGB 12.2* 13.6 10.9*  HCT 35.8* 40.0 33.3*  PLT 230  --  228   BMET  Recent Labs  07/28/15 1700 07/28/15 1706 07/29/15 0254  NA 134* 135 135  K 4.5 4.4 4.5  CL 103 101 103  CO2 19*  --  24  GLUCOSE 132* 135* 184*  BUN 29* 32* 27*  CREATININE 1.33* 1.10 1.20  CALCIUM 8.9  --  8.8*   PT/INR  Recent Labs  07/29/15 1921 07/30/15 0507  LABPROT 15.6* 15.2  INR 1.23 1.18   ABG No results for input(s): PHART, HCO3 in the last 72 hours.  Invalid input(s): PCO2, PO2  Studies/Results: Ct Head Wo Contrast  07/28/2015  CLINICAL DATA:  Fall. EXAM: CT HEAD WITHOUT CONTRAST CT MAXILLOFACIAL WITHOUT CONTRAST CT CERVICAL SPINE WITHOUT CONTRAST TECHNIQUE: Multidetector CT imaging of the head, cervical spine, and maxillofacial structures were performed using the standard protocol without intravenous contrast. Multiplanar CT image reconstructions of the cervical spine and maxillofacial structures were also generated. COMPARISON:  CT scan of head of February 09, 2013. FINDINGS: CT HEAD FINDINGS Mildly depressed  and comminuted frontal skull fracture is noted. Mild bilateral frontal subarachnoid hemorrhage is noted. Mild diffuse cortical atrophy is noted. Mild chronic ischemic white matter disease is noted. Ventricular size is within normal limits. No midline shift is noted. Possible 1 cm left frontal intraparenchymal hemorrhage is noted. CT MAXILLOFACIAL FINDINGS The mandible appears normal moderately depressed left nasal bone fracture is noted. Mildly displaced left zygomatic arch fracture is noted, with mildly displaced left posterior maxillary wall fracture. Left pterygoid plate appears normal. Moderately displaced and comminuted fracture is seen involving right pterygoid plate with associated fracture of the posterior wall of the right maxillary sinus. Minimally displaced fracture is seen involving the right lamina papyracea. Moderately displaced right anterior maxillary wall fracture is noted. Hemorrhage is noted in both maxillary sinuses as well as probably in the ethmoid and sphenoid sinuses. Fractures are seen involving the lateral orbital walls bilaterally. Mildly displaced fractures are seen involving the superior orbital roofs bilaterally. Mildly displaced right orbital floor fracture is noted. Large amount of soft tissue gas is seen in the soft tissues lateral to the right maxillary sinus. CT CERVICAL SPINE FINDINGS No significant spondylolisthesis is noted. Mildly displaced fracture is seen involving the posterior spinous process of C5. Mild degenerative disc disease is noted at C3-4, C5-6 and C6-7. Visualized lung apices are unremarkable. IMPRESSION: Mild diffuse cortical atrophy. Mild chronic ischemic white matter disease. Mild bilateral frontal subarachnoid hemorrhage is noted. Probable 1 cm left frontal intraparenchymal hemorrhage is noted. No midline shift is  noted. Small amount of pneumocephalus is noted. Mildly depressed and comminuted bilateral frontal skull fracture is noted. Moderately depressed left  nasal bone fracture. Mildly displaced left zygomatic arch fracture. Mildly displaced left posterior maxillary wall fracture. Moderately displaced and comminuted fracture is seen involving the posterior wall of the right maxillary sinus and the right pterygoid plate. Hemorrhage is noted in both maxillary, ethmoid and sphenoid sinuses. Minimally displaced fracture seen involving the right lamina papyracea. Moderately displaced right anterior maxillary wall fracture is noted. Mildly displaced fractures are seen involving the superior orbital roofs bilaterally. Mildly displaced right orbital floor fracture is noted. Mildly displaced bilateral lateral orbital wall fractures. Mildly displaced fracture is seen involving posterior spinous process of C5. Multilevel degenerative disc disease is noted in the cervical spine. No significant spondylolisthesis is noted. Critical Value/emergent results were called by telephone at the time of interpretation on 07/28/2015 at 5:43 pm to Dr. Bary Castilla , who verbally acknowledged these results. Electronically Signed   By: Lupita Raider, M.D.   On: 07/28/2015 17:44   Ct Cervical Spine Wo Contrast  07/28/2015  CLINICAL DATA:  Fall. EXAM: CT HEAD WITHOUT CONTRAST CT MAXILLOFACIAL WITHOUT CONTRAST CT CERVICAL SPINE WITHOUT CONTRAST TECHNIQUE: Multidetector CT imaging of the head, cervical spine, and maxillofacial structures were performed using the standard protocol without intravenous contrast. Multiplanar CT image reconstructions of the cervical spine and maxillofacial structures were also generated. COMPARISON:  CT scan of head of February 09, 2013. FINDINGS: CT HEAD FINDINGS Mildly depressed and comminuted frontal skull fracture is noted. Mild bilateral frontal subarachnoid hemorrhage is noted. Mild diffuse cortical atrophy is noted. Mild chronic ischemic white matter disease is noted. Ventricular size is within normal limits. No midline shift is noted. Possible 1 cm left  frontal intraparenchymal hemorrhage is noted. CT MAXILLOFACIAL FINDINGS The mandible appears normal moderately depressed left nasal bone fracture is noted. Mildly displaced left zygomatic arch fracture is noted, with mildly displaced left posterior maxillary wall fracture. Left pterygoid plate appears normal. Moderately displaced and comminuted fracture is seen involving right pterygoid plate with associated fracture of the posterior wall of the right maxillary sinus. Minimally displaced fracture is seen involving the right lamina papyracea. Moderately displaced right anterior maxillary wall fracture is noted. Hemorrhage is noted in both maxillary sinuses as well as probably in the ethmoid and sphenoid sinuses. Fractures are seen involving the lateral orbital walls bilaterally. Mildly displaced fractures are seen involving the superior orbital roofs bilaterally. Mildly displaced right orbital floor fracture is noted. Large amount of soft tissue gas is seen in the soft tissues lateral to the right maxillary sinus. CT CERVICAL SPINE FINDINGS No significant spondylolisthesis is noted. Mildly displaced fracture is seen involving the posterior spinous process of C5. Mild degenerative disc disease is noted at C3-4, C5-6 and C6-7. Visualized lung apices are unremarkable. IMPRESSION: Mild diffuse cortical atrophy. Mild chronic ischemic white matter disease. Mild bilateral frontal subarachnoid hemorrhage is noted. Probable 1 cm left frontal intraparenchymal hemorrhage is noted. No midline shift is noted. Small amount of pneumocephalus is noted. Mildly depressed and comminuted bilateral frontal skull fracture is noted. Moderately depressed left nasal bone fracture. Mildly displaced left zygomatic arch fracture. Mildly displaced left posterior maxillary wall fracture. Moderately displaced and comminuted fracture is seen involving the posterior wall of the right maxillary sinus and the right pterygoid plate. Hemorrhage is noted  in both maxillary, ethmoid and sphenoid sinuses. Minimally displaced fracture seen involving the right lamina papyracea. Moderately displaced right anterior maxillary wall  fracture is noted. Mildly displaced fractures are seen involving the superior orbital roofs bilaterally. Mildly displaced right orbital floor fracture is noted. Mildly displaced bilateral lateral orbital wall fractures. Mildly displaced fracture is seen involving posterior spinous process of C5. Multilevel degenerative disc disease is noted in the cervical spine. No significant spondylolisthesis is noted. Critical Value/emergent results were called by telephone at the time of interpretation on 07/28/2015 at 5:43 pm to Dr. Bary Castilla , who verbally acknowledged these results. Electronically Signed   By: Lupita Raider, M.D.   On: 07/28/2015 17:44   Dg Hand 2 View Left  07/28/2015  CLINICAL DATA:  Larey Seat today, pain at MCP joints, bruising EXAM: LEFT HAND - 2 VIEW COMPARISON:  LEFT wrist radiographs 05/19/2014 FINDINGS: Diffuse osseous demineralization. Scattered degenerative changes of interphalangeal joints. Osteolysis versus resection of trapezium. Degenerative changes first and second MCP joints. No acute fracture, dislocation, or bone destruction. Soft tissue swelling at distal phalanx index finger. Corticated ossicle noted dorsal to mid carpals appear present since 2016. Additional osseous density dorsal to the distal carpal row, seen only on lateral view, was not definitely visualized previously and is indeterminate in age. IMPRESSION: Osseous demineralization with scattered degenerative changes as above. Additional calcific density is seen dorsal to the distal carpal row on lateral view new since prior exam, age-indeterminate ; correlation for pain/tenderness at this site recommended to exclude acute fracture. Electronically Signed   By: Ulyses Southward M.D.   On: 07/28/2015 18:39   Dg Chest Portable 1 View  07/28/2015  CLINICAL DATA:   82 year old male with fall. EXAM: PORTABLE CHEST 1 VIEW COMPARISON:  07/28/2015 and prior radiographs FINDINGS: The cardiomediastinal silhouette is unremarkable. Right lower lung atelectasis versus airspace disease noted. There is no evidence of pulmonary edema, suspicious pulmonary nodule/mass, pleural effusion, or pneumothorax. No acute bony abnormalities are identified. IMPRESSION: Right lower lung atelectasis versus airspace disease. No evidence of pneumothorax. Electronically Signed   By: Harmon Pier M.D.   On: 07/28/2015 18:32   Dg Chest Portable 1 View  07/28/2015  CLINICAL DATA:  Fall EXAM: PORTABLE CHEST 1 VIEW COMPARISON:  05/18/2014 chest radiograph. FINDINGS: A portion of the costophrenic angles is excluded from the image. Stable cardiomediastinal silhouette with normal heart size. There is a nonspecific lucency at the right lung base, cannot exclude a small basilar right pneumothorax. No left pneumothorax. No pleural effusion. Hyperinflated lungs. Emphysema. No pulmonary edema. Mild curvilinear opacities at the right lung base. Stable small granuloma in the left mid lung. No displaced fractures in the visualized chest, noting limited visualization of the right lower ribs due to overlying wires. IMPRESSION: 1. Nonspecific lucency at the right lung base, cannot exclude a small basilar right pneumothorax. Consider further evaluation with repeat upright PA and lateral chest radiographs and/or chest CT as clinically warranted . 2. Hyperinflated lungs and emphysema, suggesting COPD. Mild curvilinear opacity at the right lung base, probably atelectasis or scarring. These results were called by telephone at the time of interpretation on 07/28/2015 at 5:55 pm to Dr. Bary Castilla , who verbally acknowledged these results. Electronically Signed   By: Delbert Phenix M.D.   On: 07/28/2015 17:56   Ct Maxillofacial Wo Cm  07/28/2015  CLINICAL DATA:  Fall. EXAM: CT HEAD WITHOUT CONTRAST CT MAXILLOFACIAL  WITHOUT CONTRAST CT CERVICAL SPINE WITHOUT CONTRAST TECHNIQUE: Multidetector CT imaging of the head, cervical spine, and maxillofacial structures were performed using the standard protocol without intravenous contrast. Multiplanar CT image reconstructions of the cervical  spine and maxillofacial structures were also generated. COMPARISON:  CT scan of head of February 09, 2013. FINDINGS: CT HEAD FINDINGS Mildly depressed and comminuted frontal skull fracture is noted. Mild bilateral frontal subarachnoid hemorrhage is noted. Mild diffuse cortical atrophy is noted. Mild chronic ischemic white matter disease is noted. Ventricular size is within normal limits. No midline shift is noted. Possible 1 cm left frontal intraparenchymal hemorrhage is noted. CT MAXILLOFACIAL FINDINGS The mandible appears normal moderately depressed left nasal bone fracture is noted. Mildly displaced left zygomatic arch fracture is noted, with mildly displaced left posterior maxillary wall fracture. Left pterygoid plate appears normal. Moderately displaced and comminuted fracture is seen involving right pterygoid plate with associated fracture of the posterior wall of the right maxillary sinus. Minimally displaced fracture is seen involving the right lamina papyracea. Moderately displaced right anterior maxillary wall fracture is noted. Hemorrhage is noted in both maxillary sinuses as well as probably in the ethmoid and sphenoid sinuses. Fractures are seen involving the lateral orbital walls bilaterally. Mildly displaced fractures are seen involving the superior orbital roofs bilaterally. Mildly displaced right orbital floor fracture is noted. Large amount of soft tissue gas is seen in the soft tissues lateral to the right maxillary sinus. CT CERVICAL SPINE FINDINGS No significant spondylolisthesis is noted. Mildly displaced fracture is seen involving the posterior spinous process of C5. Mild degenerative disc disease is noted at C3-4, C5-6 and  C6-7. Visualized lung apices are unremarkable. IMPRESSION: Mild diffuse cortical atrophy. Mild chronic ischemic white matter disease. Mild bilateral frontal subarachnoid hemorrhage is noted. Probable 1 cm left frontal intraparenchymal hemorrhage is noted. No midline shift is noted. Small amount of pneumocephalus is noted. Mildly depressed and comminuted bilateral frontal skull fracture is noted. Moderately depressed left nasal bone fracture. Mildly displaced left zygomatic arch fracture. Mildly displaced left posterior maxillary wall fracture. Moderately displaced and comminuted fracture is seen involving the posterior wall of the right maxillary sinus and the right pterygoid plate. Hemorrhage is noted in both maxillary, ethmoid and sphenoid sinuses. Minimally displaced fracture seen involving the right lamina papyracea. Moderately displaced right anterior maxillary wall fracture is noted. Mildly displaced fractures are seen involving the superior orbital roofs bilaterally. Mildly displaced right orbital floor fracture is noted. Mildly displaced bilateral lateral orbital wall fractures. Mildly displaced fracture is seen involving posterior spinous process of C5. Multilevel degenerative disc disease is noted in the cervical spine. No significant spondylolisthesis is noted. Critical Value/emergent results were called by telephone at the time of interpretation on 07/28/2015 at 5:43 pm to Dr. Bary CastillaOURTENEY MACKUEN , who verbally acknowledged these results. Electronically Signed   By: Lupita RaiderJames  Green Jr, M.D.   On: 07/28/2015 17:44    Anti-infectives: Anti-infectives    None      Assessment/Plan: Patient Active Problem List   Diagnosis Date Noted  . SAH (subarachnoid hemorrhage) (HCC) 07/28/2015  . Closed left hip fracture (HCC) 05/18/2014  . Osteoarthritis of right hip 03/31/2012  . Weakness generalized 08/20/2010  . HTN (hypertension) 08/20/2010  . Atrial fibrillation (HCC) 07/17/2010  . Chronic  anticoagulation 07/17/2010  . COPD (chronic obstructive pulmonary disease) (HCC) 07/17/2010  Leave in ICU Soft diet  Hold anticoagulation    LOS: 2 days    Delwin Raczkowski A. 07/30/2015

## 2015-07-31 ENCOUNTER — Inpatient Hospital Stay (HOSPITAL_COMMUNITY): Payer: Medicare Other

## 2015-07-31 LAB — COMPREHENSIVE METABOLIC PANEL
ALBUMIN: 3.2 g/dL — AB (ref 3.5–5.0)
ALT: 39 U/L (ref 17–63)
AST: 37 U/L (ref 15–41)
Alkaline Phosphatase: 48 U/L (ref 38–126)
Anion gap: 9 (ref 5–15)
BILIRUBIN TOTAL: 1.7 mg/dL — AB (ref 0.3–1.2)
BUN: 23 mg/dL — AB (ref 6–20)
CALCIUM: 8.9 mg/dL (ref 8.9–10.3)
CHLORIDE: 107 mmol/L (ref 101–111)
CO2: 27 mmol/L (ref 22–32)
CREATININE: 0.9 mg/dL (ref 0.61–1.24)
GFR calc Af Amer: >60 mL/min (ref >60)
GLUCOSE: 142 mg/dL — AB (ref 65–99)
Potassium: 3.7 mmol/L (ref 3.5–5.1)
Sodium: 143 mmol/L (ref 135–145)
TOTAL PROTEIN: 6.7 g/dL (ref 6.5–8.1)

## 2015-07-31 LAB — CBC
HEMATOCRIT: 33.5 % — AB (ref 39.0–52.0)
Hemoglobin: 11.1 g/dL — ABNORMAL LOW (ref 13.0–17.0)
MCH: 31.3 pg (ref 26.0–34.0)
MCHC: 33.1 g/dL (ref 30.0–36.0)
MCV: 94.4 fL (ref 78.0–100.0)
PLATELETS: 213 10*3/uL (ref 150–400)
RBC: 3.55 MIL/uL — AB (ref 4.22–5.81)
RDW: 14.5 % (ref 11.5–15.5)
WBC: 12.2 10*3/uL — AB (ref 4.0–10.5)

## 2015-07-31 LAB — PROTIME-INR
INR: 1.17 (ref 0.00–1.49)
Prothrombin Time: 15.1 seconds (ref 11.6–15.2)

## 2015-07-31 MED ORDER — TRAMADOL HCL 50 MG PO TABS
50.0000 mg | ORAL_TABLET | Freq: Four times a day (QID) | ORAL | Status: DC | PRN
  Administered 2015-07-31: 50 mg via ORAL
  Filled 2015-07-31: qty 1

## 2015-07-31 NOTE — Progress Notes (Signed)
SUBJECTIVE:  No complaints.  He has been sleepy all day per the nursing student.   OBJECTIVE:   Vitals:   Filed Vitals:   07/31/15 1000 07/31/15 1100 07/31/15 1200 07/31/15 1236  BP: 134/78 147/85 149/81   Pulse: 78 72 87   Temp:   97.8 F (36.6 C)   TempSrc:   Oral   Resp: 26 17 24    Height:      Weight:      SpO2: 87% 96% 97% 97%   I&O's:   Intake/Output Summary (Last 24 hours) at 07/31/15 1502 Last data filed at 07/31/15 1400  Gross per 24 hour  Intake   1160 ml  Output   1420 ml  Net   -260 ml   TELEMETRY: Reviewed telemetry pt in AFib, rate controlled:     PHYSICAL EXAM General: Well developed, well nourished, in no acute distress Head:   Face bruised diffusely  Lungs:   Clear bilaterally to auscultation. Heart:   Irregularly irregular S1 S2  No JVD.   Abdomen: abdomen soft and non-tender Msk:  Back normal,  Normal strength and tone for age. Extremities:   No edema.   Neuro: Alert and oriented. Psych:  Normal affect, responds appropriately Skin: No rash   LABS: Basic Metabolic Panel:  Recent Labs  30/86/57 0254 07/31/15 0341  NA 135 143  K 4.5 3.7  CL 103 107  CO2 24 27  GLUCOSE 184* 142*  BUN 27* 23*  CREATININE 1.20 0.90  CALCIUM 8.8* 8.9   Liver Function Tests:  Recent Labs  07/29/15 0254 07/31/15 0341  AST 27 37  ALT 19 39  ALKPHOS 50 48  BILITOT 0.9 1.7*  PROT 6.2* 6.7  ALBUMIN 3.5 3.2*   No results for input(s): LIPASE, AMYLASE in the last 72 hours. CBC:  Recent Labs  07/28/15 1700  07/29/15 0254 07/31/15 0341  WBC 12.8*  --  13.6* 12.2*  NEUTROABS 7.6  --   --   --   HGB 12.2*  < > 10.9* 11.1*  HCT 35.8*  < > 33.3* 33.5*  MCV 92.5  --  91.7 94.4  PLT 230  --  228 213  < > = values in this interval not displayed. Cardiac Enzymes:  Recent Labs  07/28/15 2028  TROPONINI <0.03   BNP: Invalid input(s): POCBNP D-Dimer: No results for input(s): DDIMER in the last 72 hours. Hemoglobin A1C: No results for  input(s): HGBA1C in the last 72 hours. Fasting Lipid Panel: No results for input(s): CHOL, HDL, LDLCALC, TRIG, CHOLHDL, LDLDIRECT in the last 72 hours. Thyroid Function Tests: No results for input(s): TSH, T4TOTAL, T3FREE, THYROIDAB in the last 72 hours.  Invalid input(s): FREET3 Anemia Panel: No results for input(s): VITAMINB12, FOLATE, FERRITIN, TIBC, IRON, RETICCTPCT in the last 72 hours. Coag Panel:   Lab Results  Component Value Date   INR 1.17 07/31/2015   INR 1.18 07/30/2015   INR 1.23 07/29/2015    RADIOLOGY: Ct Head Wo Contrast  07/28/2015  CLINICAL DATA:  Fall. EXAM: CT HEAD WITHOUT CONTRAST CT MAXILLOFACIAL WITHOUT CONTRAST CT CERVICAL SPINE WITHOUT CONTRAST TECHNIQUE: Multidetector CT imaging of the head, cervical spine, and maxillofacial structures were performed using the standard protocol without intravenous contrast. Multiplanar CT image reconstructions of the cervical spine and maxillofacial structures were also generated. COMPARISON:  CT scan of head of February 09, 2013. FINDINGS: CT HEAD FINDINGS Mildly depressed and comminuted frontal skull fracture is noted. Mild bilateral frontal subarachnoid hemorrhage is  noted. Mild diffuse cortical atrophy is noted. Mild chronic ischemic white matter disease is noted. Ventricular size is within normal limits. No midline shift is noted. Possible 1 cm left frontal intraparenchymal hemorrhage is noted. CT MAXILLOFACIAL FINDINGS The mandible appears normal moderately depressed left nasal bone fracture is noted. Mildly displaced left zygomatic arch fracture is noted, with mildly displaced left posterior maxillary wall fracture. Left pterygoid plate appears normal. Moderately displaced and comminuted fracture is seen involving right pterygoid plate with associated fracture of the posterior wall of the right maxillary sinus. Minimally displaced fracture is seen involving the right lamina papyracea. Moderately displaced right anterior maxillary  wall fracture is noted. Hemorrhage is noted in both maxillary sinuses as well as probably in the ethmoid and sphenoid sinuses. Fractures are seen involving the lateral orbital walls bilaterally. Mildly displaced fractures are seen involving the superior orbital roofs bilaterally. Mildly displaced right orbital floor fracture is noted. Large amount of soft tissue gas is seen in the soft tissues lateral to the right maxillary sinus. CT CERVICAL SPINE FINDINGS No significant spondylolisthesis is noted. Mildly displaced fracture is seen involving the posterior spinous process of C5. Mild degenerative disc disease is noted at C3-4, C5-6 and C6-7. Visualized lung apices are unremarkable. IMPRESSION: Mild diffuse cortical atrophy. Mild chronic ischemic white matter disease. Mild bilateral frontal subarachnoid hemorrhage is noted. Probable 1 cm left frontal intraparenchymal hemorrhage is noted. No midline shift is noted. Small amount of pneumocephalus is noted. Mildly depressed and comminuted bilateral frontal skull fracture is noted. Moderately depressed left nasal bone fracture. Mildly displaced left zygomatic arch fracture. Mildly displaced left posterior maxillary wall fracture. Moderately displaced and comminuted fracture is seen involving the posterior wall of the right maxillary sinus and the right pterygoid plate. Hemorrhage is noted in both maxillary, ethmoid and sphenoid sinuses. Minimally displaced fracture seen involving the right lamina papyracea. Moderately displaced right anterior maxillary wall fracture is noted. Mildly displaced fractures are seen involving the superior orbital roofs bilaterally. Mildly displaced right orbital floor fracture is noted. Mildly displaced bilateral lateral orbital wall fractures. Mildly displaced fracture is seen involving posterior spinous process of C5. Multilevel degenerative disc disease is noted in the cervical spine. No significant spondylolisthesis is noted. Critical  Value/emergent results were called by telephone at the time of interpretation on 07/28/2015 at 5:43 pm to Dr. Bary Castilla , who verbally acknowledged these results. Electronically Signed   By: Lupita Raider, M.D.   On: 07/28/2015 17:44   Ct Cervical Spine Wo Contrast  07/28/2015  CLINICAL DATA:  Fall. EXAM: CT HEAD WITHOUT CONTRAST CT MAXILLOFACIAL WITHOUT CONTRAST CT CERVICAL SPINE WITHOUT CONTRAST TECHNIQUE: Multidetector CT imaging of the head, cervical spine, and maxillofacial structures were performed using the standard protocol without intravenous contrast. Multiplanar CT image reconstructions of the cervical spine and maxillofacial structures were also generated. COMPARISON:  CT scan of head of February 09, 2013. FINDINGS: CT HEAD FINDINGS Mildly depressed and comminuted frontal skull fracture is noted. Mild bilateral frontal subarachnoid hemorrhage is noted. Mild diffuse cortical atrophy is noted. Mild chronic ischemic white matter disease is noted. Ventricular size is within normal limits. No midline shift is noted. Possible 1 cm left frontal intraparenchymal hemorrhage is noted. CT MAXILLOFACIAL FINDINGS The mandible appears normal moderately depressed left nasal bone fracture is noted. Mildly displaced left zygomatic arch fracture is noted, with mildly displaced left posterior maxillary wall fracture. Left pterygoid plate appears normal. Moderately displaced and comminuted fracture is seen involving right pterygoid plate with  associated fracture of the posterior wall of the right maxillary sinus. Minimally displaced fracture is seen involving the right lamina papyracea. Moderately displaced right anterior maxillary wall fracture is noted. Hemorrhage is noted in both maxillary sinuses as well as probably in the ethmoid and sphenoid sinuses. Fractures are seen involving the lateral orbital walls bilaterally. Mildly displaced fractures are seen involving the superior orbital roofs bilaterally.  Mildly displaced right orbital floor fracture is noted. Large amount of soft tissue gas is seen in the soft tissues lateral to the right maxillary sinus. CT CERVICAL SPINE FINDINGS No significant spondylolisthesis is noted. Mildly displaced fracture is seen involving the posterior spinous process of C5. Mild degenerative disc disease is noted at C3-4, C5-6 and C6-7. Visualized lung apices are unremarkable. IMPRESSION: Mild diffuse cortical atrophy. Mild chronic ischemic white matter disease. Mild bilateral frontal subarachnoid hemorrhage is noted. Probable 1 cm left frontal intraparenchymal hemorrhage is noted. No midline shift is noted. Small amount of pneumocephalus is noted. Mildly depressed and comminuted bilateral frontal skull fracture is noted. Moderately depressed left nasal bone fracture. Mildly displaced left zygomatic arch fracture. Mildly displaced left posterior maxillary wall fracture. Moderately displaced and comminuted fracture is seen involving the posterior wall of the right maxillary sinus and the right pterygoid plate. Hemorrhage is noted in both maxillary, ethmoid and sphenoid sinuses. Minimally displaced fracture seen involving the right lamina papyracea. Moderately displaced right anterior maxillary wall fracture is noted. Mildly displaced fractures are seen involving the superior orbital roofs bilaterally. Mildly displaced right orbital floor fracture is noted. Mildly displaced bilateral lateral orbital wall fractures. Mildly displaced fracture is seen involving posterior spinous process of C5. Multilevel degenerative disc disease is noted in the cervical spine. No significant spondylolisthesis is noted. Critical Value/emergent results were called by telephone at the time of interpretation on 07/28/2015 at 5:43 pm to Dr. Bary CastillaOURTENEY MACKUEN , who verbally acknowledged these results. Electronically Signed   By: Lupita RaiderJames  Green Jr, M.D.   On: 07/28/2015 17:44   Dg Hand 2 View Left  07/28/2015   CLINICAL DATA:  Larey SeatFell today, pain at MCP joints, bruising EXAM: LEFT HAND - 2 VIEW COMPARISON:  LEFT wrist radiographs 05/19/2014 FINDINGS: Diffuse osseous demineralization. Scattered degenerative changes of interphalangeal joints. Osteolysis versus resection of trapezium. Degenerative changes first and second MCP joints. No acute fracture, dislocation, or bone destruction. Soft tissue swelling at distal phalanx index finger. Corticated ossicle noted dorsal to mid carpals appear present since 2016. Additional osseous density dorsal to the distal carpal row, seen only on lateral view, was not definitely visualized previously and is indeterminate in age. IMPRESSION: Osseous demineralization with scattered degenerative changes as above. Additional calcific density is seen dorsal to the distal carpal row on lateral view new since prior exam, age-indeterminate ; correlation for pain/tenderness at this site recommended to exclude acute fracture. Electronically Signed   By: Ulyses SouthwardMark  Boles M.D.   On: 07/28/2015 18:39   Dg Chest Portable 1 View  07/28/2015  CLINICAL DATA:  82 year old male with fall. EXAM: PORTABLE CHEST 1 VIEW COMPARISON:  07/28/2015 and prior radiographs FINDINGS: The cardiomediastinal silhouette is unremarkable. Right lower lung atelectasis versus airspace disease noted. There is no evidence of pulmonary edema, suspicious pulmonary nodule/mass, pleural effusion, or pneumothorax. No acute bony abnormalities are identified. IMPRESSION: Right lower lung atelectasis versus airspace disease. No evidence of pneumothorax. Electronically Signed   By: Harmon PierJeffrey  Hu M.D.   On: 07/28/2015 18:32   Dg Chest Portable 1 View  07/28/2015  CLINICAL DATA:  Fall EXAM: PORTABLE CHEST 1 VIEW COMPARISON:  05/18/2014 chest radiograph. FINDINGS: A portion of the costophrenic angles is excluded from the image. Stable cardiomediastinal silhouette with normal heart size. There is a nonspecific lucency at the right lung base, cannot  exclude a small basilar right pneumothorax. No left pneumothorax. No pleural effusion. Hyperinflated lungs. Emphysema. No pulmonary edema. Mild curvilinear opacities at the right lung base. Stable small granuloma in the left mid lung. No displaced fractures in the visualized chest, noting limited visualization of the right lower ribs due to overlying wires. IMPRESSION: 1. Nonspecific lucency at the right lung base, cannot exclude a small basilar right pneumothorax. Consider further evaluation with repeat upright PA and lateral chest radiographs and/or chest CT as clinically warranted . 2. Hyperinflated lungs and emphysema, suggesting COPD. Mild curvilinear opacity at the right lung base, probably atelectasis or scarring. These results were called by telephone at the time of interpretation on 07/28/2015 at 5:55 pm to Dr. Bary Castilla , who verbally acknowledged these results. Electronically Signed   By: Delbert Phenix M.D.   On: 07/28/2015 17:56   Dg Cerv Spine Flex&ext Only  07/31/2015  CLINICAL DATA:  Trauma, clearance films EXAM: CERVICAL SPINE - FLEXION AND EXTENSION VIEWS ONLY COMPARISON:  CT cervical spine 07/28/2015 FINDINGS: Prevertebral soft tissues normal thickness. Vertebral body heights maintained. Scattered disc space narrowing greatest at C5-C6 and C6-C7 where endplate spurs are identified. No acute fracture or subluxation. Little collection is demonstrated. No abnormal motion is seen with demonstrated flexion or extension. IMPRESSION: Degenerative disc disease changes of the cervical spine as above. No abnormal motion identified with flexion or extension. Electronically Signed   By: Ulyses Southward M.D.   On: 07/31/2015 13:05   Ct Maxillofacial Wo Cm  07/28/2015  CLINICAL DATA:  Fall. EXAM: CT HEAD WITHOUT CONTRAST CT MAXILLOFACIAL WITHOUT CONTRAST CT CERVICAL SPINE WITHOUT CONTRAST TECHNIQUE: Multidetector CT imaging of the head, cervical spine, and maxillofacial structures were performed using the  standard protocol without intravenous contrast. Multiplanar CT image reconstructions of the cervical spine and maxillofacial structures were also generated. COMPARISON:  CT scan of head of February 09, 2013. FINDINGS: CT HEAD FINDINGS Mildly depressed and comminuted frontal skull fracture is noted. Mild bilateral frontal subarachnoid hemorrhage is noted. Mild diffuse cortical atrophy is noted. Mild chronic ischemic white matter disease is noted. Ventricular size is within normal limits. No midline shift is noted. Possible 1 cm left frontal intraparenchymal hemorrhage is noted. CT MAXILLOFACIAL FINDINGS The mandible appears normal moderately depressed left nasal bone fracture is noted. Mildly displaced left zygomatic arch fracture is noted, with mildly displaced left posterior maxillary wall fracture. Left pterygoid plate appears normal. Moderately displaced and comminuted fracture is seen involving right pterygoid plate with associated fracture of the posterior wall of the right maxillary sinus. Minimally displaced fracture is seen involving the right lamina papyracea. Moderately displaced right anterior maxillary wall fracture is noted. Hemorrhage is noted in both maxillary sinuses as well as probably in the ethmoid and sphenoid sinuses. Fractures are seen involving the lateral orbital walls bilaterally. Mildly displaced fractures are seen involving the superior orbital roofs bilaterally. Mildly displaced right orbital floor fracture is noted. Large amount of soft tissue gas is seen in the soft tissues lateral to the right maxillary sinus. CT CERVICAL SPINE FINDINGS No significant spondylolisthesis is noted. Mildly displaced fracture is seen involving the posterior spinous process of C5. Mild degenerative disc disease is noted at C3-4, C5-6 and C6-7. Visualized lung apices are unremarkable.  IMPRESSION: Mild diffuse cortical atrophy. Mild chronic ischemic white matter disease. Mild bilateral frontal subarachnoid  hemorrhage is noted. Probable 1 cm left frontal intraparenchymal hemorrhage is noted. No midline shift is noted. Small amount of pneumocephalus is noted. Mildly depressed and comminuted bilateral frontal skull fracture is noted. Moderately depressed left nasal bone fracture. Mildly displaced left zygomatic arch fracture. Mildly displaced left posterior maxillary wall fracture. Moderately displaced and comminuted fracture is seen involving the posterior wall of the right maxillary sinus and the right pterygoid plate. Hemorrhage is noted in both maxillary, ethmoid and sphenoid sinuses. Minimally displaced fracture seen involving the right lamina papyracea. Moderately displaced right anterior maxillary wall fracture is noted. Mildly displaced fractures are seen involving the superior orbital roofs bilaterally. Mildly displaced right orbital floor fracture is noted. Mildly displaced bilateral lateral orbital wall fractures. Mildly displaced fracture is seen involving posterior spinous process of C5. Multilevel degenerative disc disease is noted in the cervical spine. No significant spondylolisthesis is noted. Critical Value/emergent results were called by telephone at the time of interpretation on 07/28/2015 at 5:43 pm to Dr. Bary Castilla , who verbally acknowledged these results. Electronically Signed   By: Lupita Raider, M.D.   On: 07/28/2015 17:44      ASSESSMENT: Kyle [name]:    1) AFib: rate controlled. No anticoagulation at this time. Continue current rate control meds.  Corky Crafts, MD  07/31/2015  3:02 PM

## 2015-07-31 NOTE — Progress Notes (Signed)
Trauma Service Note  Subjective: Patietn answers questions, but delayed and mumbling.  Does not appear to be in any acute distress.  Nasal apssages appear to be partially obstructed.  Objective: Vital signs in last 24 hours: Temp:  [97.6 F (36.4 C)-98.8 F (37.1 C)] 98.2 F (36.8 C) (04/03 0400) Pulse Rate:  [63-133] 86 (04/03 0700) Resp:  [14-30] 22 (04/03 0700) BP: (113-152)/(58-88) 136/79 mmHg (04/03 0700) SpO2:  [90 %-100 %] 95 % (04/03 0700) FiO2 (%):  [40 %] 40 % (04/02 1900) Last BM Date:  (pta)  Intake/Output from previous day: 04/02 0701 - 04/03 0700 In: 1410 [I.V.:1200; IV Piggyback:210] Out: 1270 [Urine:1270] Intake/Output this shift:    General: No acute distress.  Hard to tell if he is demented or not.  Lungs: Clear  Abd: Soft, benign.  Has been clear for a diet.  Extremities: Lots of bruising.  Neuro: Intact  Lab Results: CBC   Recent Labs  07/29/15 0254 07/31/15 0341  WBC 13.6* 12.2*  HGB 10.9* 11.1*  HCT 33.3* 33.5*  PLT 228 213   BMET  Recent Labs  07/29/15 0254 07/31/15 0341  NA 135 143  K 4.5 3.7  CL 103 107  CO2 24 27  GLUCOSE 184* 142*  BUN 27* 23*  CREATININE 1.20 0.90  CALCIUM 8.8* 8.9   PT/INR  Recent Labs  07/30/15 0507 07/31/15 0341  LABPROT 15.2 15.1  INR 1.18 1.17   ABG No results for input(s): PHART, HCO3 in the last 72 hours.  Invalid input(s): PCO2, PO2  Studies/Results: No results found.  Anti-infectives: Anti-infectives    None      Assessment/Plan: s/p  Transfer to the floor with telemetry.  LOS: 3 days   Marta LamasJames O. Gae BonWyatt, III, MD, FACS [phone] Trauma Surgeon 07/31/2015

## 2015-07-31 NOTE — Care Management Important Message (Signed)
Important Message  Patient Details  Name: Kyle [name] MRN: [id number] Date of Birth: 05/06/1926   Medicare Important Message Given:  Other (see comment)    Kyle [name] Abena 07/31/2015, 11:41 AM

## 2015-08-01 DIAGNOSIS — W19XXXA Unspecified fall, initial encounter: Secondary | ICD-10-CM | POA: Diagnosis present

## 2015-08-01 DIAGNOSIS — S129XXA Fracture of neck, unspecified, initial encounter: Secondary | ICD-10-CM | POA: Diagnosis present

## 2015-08-01 DIAGNOSIS — S0292XA Unspecified fracture of facial bones, initial encounter for closed fracture: Secondary | ICD-10-CM | POA: Diagnosis present

## 2015-08-01 MED ORDER — TRAMADOL HCL 50 MG PO TABS
50.0000 mg | ORAL_TABLET | Freq: Four times a day (QID) | ORAL | Status: DC | PRN
  Administered 2015-08-01 (×2): 100 mg via ORAL
  Filled 2015-08-01 (×3): qty 2

## 2015-08-01 MED ORDER — LEVETIRACETAM 500 MG PO TABS
500.0000 mg | ORAL_TABLET | Freq: Two times a day (BID) | ORAL | Status: DC
  Administered 2015-08-01 – 2015-08-03 (×5): 500 mg via ORAL
  Filled 2015-08-01 (×5): qty 1

## 2015-08-01 MED ORDER — ATENOLOL 25 MG PO TABS
25.0000 mg | ORAL_TABLET | Freq: Two times a day (BID) | ORAL | Status: DC
  Administered 2015-08-01 – 2015-08-03 (×4): 25 mg via ORAL
  Filled 2015-08-01 (×4): qty 1

## 2015-08-01 NOTE — Progress Notes (Signed)
Patient HR elevated in the 180s during activities at this time. Pt with hx AF. Pt apepars cam at this time able to follow commands. Trauma notified of HR and to call CARDIOLOGY with elevation of HR> Restraints to discontinued. Will continue to monitor,  Sim BoastHavy, RN

## 2015-08-01 NOTE — NC FL2 (Signed)
[place] MEDICAID FL2 LEVEL OF CARE SCREENING TOOL     IDENTIFICATION  Patient Name: Kyle [name] Birthdate: 04/24/1927 Sex: male Admission Date (Current Location): 07/28/2015  [facility] [place]   Facility and Address:  [facility] [address], [place], [place] [address]      Provider Number: [id number]  Attending Physician Name and Address:  Trauma Md, MD  Relative Name and Phone Number:       Current Level of Care: Hospital Recommended Level of Care: Skilled Nursing Facility Prior Approval Number:    Date Approved/Denied:   PASRR Number: [id number]  Discharge Plan: SNF    Current Diagnoses: Patient Active Problem List   Diagnosis Date Noted  . Fall 08/01/2015  . Closed fracture of facial bones (HCC) 08/01/2015  . Fracture of cervical spinous process (HCC) 08/01/2015  . TBI (traumatic brain injury) (HCC) 07/28/2015  . Closed left hip fracture (HCC) 05/18/2014  . Osteoarthritis of right hip 03/31/2012  . Weakness generalized 08/20/2010  . HTN (hypertension) 08/20/2010  . Atrial fibrillation (HCC) 07/17/2010  . Chronic anticoagulation 07/17/2010  . COPD (chronic obstructive pulmonary disease) (HCC) 07/17/2010    Orientation RESPIRATION BLADDER Height & Weight     Self  Normal Continent Weight: 67.7 kg (149 lb 4 oz) Height:  6\' 1"  (185.4 cm)  BEHAVIORAL SYMPTOMS/MOOD NEUROLOGICAL BOWEL NUTRITION STATUS   (NONE)  (NONE) Continent Diet (Soft Diet)  AMBULATORY STATUS COMMUNICATION OF NEEDS Skin   Extensive Assist Verbally Normal, Bruising                       Personal Care Assistance Level of Assistance  Bathing, Feeding, Dressing Bathing Assistance: Maximum assistance Feeding assistance: Independent Dressing Assistance: Maximum assistance     Functional Limitations Info  Sight, Hearing, Speech Sight Info: Adequate Hearing Info: Adequate Speech Info: Adequate    SPECIAL CARE FACTORS FREQUENCY  PT  (By licensed PT), OT (By licensed OT)     PT Frequency: 5/week OT Frequency: 5/week            Contractures Contractures Info: Not present    Additional Factors Info  Code Status, Allergies Code Status Info: Full Allergies Info: Advair Diskus, Dilaudid, Morphine And Related           Current Medications (08/01/2015):  This is the current hospital active medication list Current Facility-Administered Medications  Medication Dose Route Frequency Provider Last Rate Last Dose  . alfuzosin (UROXATRAL) 24 hr tablet 10 mg  10 mg Oral BID Harriette Bouillonhomas Cornett, MD   10 mg at 08/01/15 0915  . antiseptic oral rinse (CPC / CETYLPYRIDINIUM CHLORIDE 0.05%) solution 7 mL  7 mL Mouth Rinse q12n4p Harriette Bouillonhomas Cornett, MD   7 mL at 07/31/15 1600  . atenolol (TENORMIN) tablet 25 mg  25 mg Oral BID Corky CraftsJayadeep S Varanasi, MD      . atorvastatin (LIPITOR) tablet 10 mg  10 mg Oral Daily Harriette Bouillonhomas Cornett, MD   10 mg at 08/01/15 0915  . chlorhexidine (PERIDEX) 0.12 % solution 15 mL  15 mL Mouth Rinse BID Harriette Bouillonhomas Cornett, MD   15 mL at 08/01/15 0915  . levETIRAcetam (KEPPRA) tablet 500 mg  500 mg Oral BID Freeman CaldronMichael J Jeffery, PA-C   500 mg at 08/01/15 [phone]  . metoprolol (LOPRESSOR) injection 2.5-5 mg  2.5-5 mg Intravenous Q3H PRN Praveen Mannam, MD      . neomycin-bacitracin-polymyxin (NEOSPORIN) ointment   Topical TID Melvenia BeamMitchell Gore, MD      .  ondansetron (ZOFRAN) tablet 4 mg  4 mg Oral Q6H PRN Harriette Bouillon, MD       Or  . ondansetron [facility]) injection 4 mg  4 mg Intravenous Q6H PRN Harriette Bouillon, MD   4 mg at 07/29/15 1257  . sodium chloride (OCEAN) 0.65 % nasal spray 1 spray  1 spray Each Nare Q8H PRN Melvenia Beam, MD      . tiotropium [facility]) inhalation capsule 18 mcg  18 mcg Inhalation Daily Harriette Bouillon, MD   18 mcg at 08/01/15 [phone]  . traMADol (ULTRAM) tablet 50-100 mg  50-100 mg Oral Q6H PRN Freeman Caldron, PA-C   100 mg at 08/01/15 1016     Discharge Medications: Please see discharge summary for a  list of discharge medications.  Relevant Imaging Results:  Relevant Lab Results:   Additional Information SSN: [id number]  Venita Lick, LCSW

## 2015-08-01 NOTE — Evaluation (Signed)
Physical Therapy Evaluation Patient Details Name: Kyle [name] MRN: [id number] DOB: 09-27-1926 Today's Date: 08/01/2015   History of Present Illness  Patient is an 82 y/o male with PMH of fall with L femur fx 1/16, R RCT, COPD, afib, bil THA who presented s/p fall at home.  CT scan was done which shows a frontal bone fracture, subarachnoid hemorrhage, multiple facial fractures, transverse process of C5, and significant bruising of the face.   Clinical Impression  Patient presents with decreased mobility due to deficits listed in PT problem list  He will benefit from skilled PT in the acute setting to allow return home after SNF level rehab.  Patient limited cognitively, but more so by pain and fear of falling.  Will continue acute level PT until d/c.    Follow Up Recommendations SNF    Equipment Recommendations  Other (comment) (TBA)    Recommendations for Other Services       Precautions / Restrictions Precautions Precautions: Fall      Mobility  Bed Mobility               General bed mobility comments: up in chair  Transfers Overall transfer level: Needs assistance Equipment used: Rolling walker (2 wheeled) Transfers: Sit to/from Stand Sit to Stand: +2 physical assistance;Max assist         General transfer comment: max-mod of 2 due to fear of falling, poor anterior weight shift and soreness R shoulder and leg; did not want to remain standing reported fear of falling  Ambulation/Gait                Stairs            Wheelchair Mobility    Modified Rankin (Stroke Patients Only)       Balance Overall balance assessment: Needs assistance Sitting-balance support: Feet supported;Bilateral upper extremity supported Sitting balance-Leahy Scale: Poor Sitting balance - Comments: needed UE support to lean away from back of chair, was able to reach partly to his feet   Standing balance support: Bilateral upper extremity supported Standing balance-Leahy  Scale: Poor Standing balance comment: UE support and mod A for balance in standing                             Pertinent Vitals/Pain Pain Assessment: Faces Faces Pain Scale: Hurts even more Pain Location: R shoulder esp with movement, and R thigh with movement Pain Descriptors / Indicators: Sore;Aching Pain Intervention(s): Repositioned;Monitored during session;Limited activity within patient's tolerance    Home Living Family/patient expects to be discharged to:: Skilled nursing facility                      Prior Function Level of Independence: Independent         Comments: reports was independent at home, states had to assist wife at times, but not always, had a walker, but not using currently     Hand Dominance   Dominant Hand: Right    Extremity/Trunk Assessment   Upper Extremity Assessment: Defer to OT evaluation           Lower Extremity Assessment: RLE deficits/detail;LLE deficits/detail RLE Deficits / Details: AAROM grossly WFL, but demonstrates limited movement due to pain in R thigh LLE Deficits / Details: AAROM grossly WFL, strength at least 3/5 testing limited due to cognition  Cervical / Trunk Assessment: Kyphotic  Communication   Communication: HOH  Cognition Arousal/Alertness: Awake/alert Behavior During  Therapy: WFL for tasks assessed/performed Overall Cognitive Status: Impaired/Different from baseline Area of Impairment: Orientation;Problem solving;Following commands;Memory Orientation Level: Place   Memory: Decreased short-term memory Following Commands: Follows one step commands with increased time     Problem Solving: Slow processing;Requires verbal cues General Comments: perseverated once about how wife showers, each time questioned about his functioning at home, reported about how his wife functions    General Comments General comments (skin integrity, edema, etc.): severe ecchymosis surrounding face, neck and eyes,  nose with dried blood, then dark mucous drainage.  Patient denies visual deficits, but not formally tested    Exercises        Assessment/Plan    PT Assessment Patient needs continued PT services  PT Diagnosis Generalized weakness;Acute pain;Abnormality of gait   PT Problem List Decreased strength;Decreased activity tolerance;Decreased balance;Decreased mobility;Decreased safety awareness;Decreased cognition;Pain  PT Treatment Interventions DME instruction;Balance training;Gait training;Modalities;Functional mobility training;Cognitive remediation;Therapeutic activities;Therapeutic exercise;Patient/family education   PT Goals (Current goals can be found in the Care Plan section) Acute Rehab PT Goals Patient Stated Goal: To go where he can have more help PT Goal Formulation: With patient Time For Goal Achievement: 2015/05/17 Potential to Achieve Goals: Fair    Frequency Min 3X/week   Barriers to discharge Decreased caregiver support      Co-evaluation PT/OT/SLP Co-Evaluation/Treatment: Yes Reason for Co-Treatment: Complexity of the patient's impairments (multi-system involvement) PT goals addressed during session: Mobility/safety with mobility;Balance;Strengthening/ROM         End of Session Equipment Utilized During Treatment: Gait belt Activity Tolerance: Patient limited by pain Patient left: in chair;with call bell/phone within reach;with chair alarm set           Time: [id number] PT Time Calculation (min) (ACUTE ONLY): 26 min   Charges:   PT Evaluation $PT Eval High Complexity: 1 Procedure     PT G CodesElray Mcgregor:        Anjulie Dipierro 08/01/2015, 10:40 AM  Sheran Lawlessyndi Tatem Holsonback, PT [phone] 08/01/2015

## 2015-08-01 NOTE — Evaluation (Signed)
Speech Language Pathology Evaluation Patient Details Name: Kyle EdouardMyrl [name] MRN: [id number] DOB: 09/10/1926 Today's Date: 08/01/2015 Time: [id number] SLP Time Calculation (min) (ACUTE ONLY): 10 min  Problem List:  Patient Active Problem List   Diagnosis Date Noted  . Fall 08/01/2015  . Closed fracture of facial bones (HCC) 08/01/2015  . Fracture of cervical spinous process (HCC) 08/01/2015  . TBI (traumatic brain injury) (HCC) 07/28/2015  . Closed left hip fracture (HCC) 05/18/2014  . Osteoarthritis of right hip 03/31/2012  . Weakness generalized 08/20/2010  . HTN (hypertension) 08/20/2010  . Atrial fibrillation (HCC) 07/17/2010  . Chronic anticoagulation 07/17/2010  . COPD (chronic obstructive pulmonary disease) (HCC) 07/17/2010   Past Medical History:  Past Medical History  Diagnosis Date  . PAF (paroxysmal atrial fibrillation) (HCC)     Managed with rate control and coumadin  . COPD (chronic obstructive pulmonary disease) (HCC)   . HTN (hypertension)   . Hyperlipemia   . OA (osteoarthritis)   . Chronic anticoagulation   . Incomplete rotator cuff tear or rupture of right shoulder, not specified as traumatic 2012  . Chronic anticoagulation   . Nocturia   . Osteoarthritis of right hip 03/31/2012  . Shortness of breath    Past Surgical History:  Past Surgical History  Procedure Laterality Date  . Total hip arthroplasty  1998  . Shoulder arthroscopy  1994    RIGHT  . Cholecystectomy    . Appendectomy    . Cataract extraction w/ intraocular lens  implant, bilateral    . Right knee arthroscopy    . Total hip arthroplasty  03/31/2012    RIGHT HIP  . Total hip arthroplasty  03/31/2012    Procedure: TOTAL HIP ARTHROPLASTY;  Surgeon: Eulas PostJoshua P Landau, MD;  Location: [facility] OR;  Service: Orthopedics;  Laterality: Right;   HPI:  Patient is an 82 y.o. male with PMH of fall with L femur fx 1/16, R RCT, COPD, HTN, hyperlipidemia, Rt shoulder arthroplasty, SOB, OA, PAF, afib, THA who presented  s/p fall at home. CT scan was done which shows a frontal bone fracture, subarachnoid hemorrhage, multiple facial fractures, transverse process of C5, and significant bruising of the face.   Assessment / Plan / Recommendation Clinical Impression  Cognitive-linguistic evaluation completed.  Patient demonstrates cognitive impairments consistent with a Rancho Level V-VI, characterized by orientation to name and year.  Patient required Total assist for awareness of errors with DOB, [facility] and date.  Lethargy and impaired sustained attention limited evaluation of receptive and expressive language abilities today.  However, at this time cognitive impairments are significant enough to impact the patient's overall safety with basic functional self-care tasks. Patient also demonstrates low vocal intensity that impacts speech intelligibility, suspect to be impacted by cognition as well.  Patient would benefit from skilled SLP intervention in order to maximize their functional independence prior to discharge. Anticipate patient will require 24 hour supervision and follow up SLP services.      SLP Assessment  Patient needs continued Speech Lanaguage Pathology Services    Follow Up Recommendations  24 hour supervision/assistance;Inpatient Rehab vs. Skilled Nursing facility    Frequency and Duration min 2x/week  2 weeks      SLP Evaluation Prior Functioning  Cognitive/Linguistic Baseline: Information not available Type of Home: House  Lives With: Spouse Available Help at Discharge: Family;Available PRN/intermittently Vocation: Retired   IT consultantCognition  Overall Cognitive Status: No family/caregiver present to determine baseline cognitive functioning Arousal/Alertness: Lethargic Orientation Level: Disoriented  to person;Disoriented to place;Disoriented to time;Disoriented to situation (able to state name but not DOB accurately ) Attention: Sustained Sustained Attention:  Impaired Sustained Attention Impairment: Verbal basic;Functional basic Memory: Impaired Awareness: Impaired Problem Solving: Impaired Executive Function:  (all impaired due to lower level deficits ) Safety/Judgment: Impaired Rancho Mirant Scales of Cognitive Functioning: Confused/inappropriate/non-agitated    Comprehension  Auditory Comprehension Overall Auditory Comprehension:  (difficult to assess due to impaired sustained attention ) Visual Recognition/Discrimination Discrimination: Not tested Reading Comprehension Reading Status: Not tested    Expression Expression Primary Mode of Expression: Verbal Verbal Expression Overall Verbal Expression: Other (comment) (diffcult to assess due to impairments in sustained attention ) Written Expression Dominant Hand: Right Written Expression: Not tested   Oral / Motor  Oral Motor/Sensory Function Overall Oral Motor/Sensory Function: Within functional limits Motor Speech Overall Motor Speech: Impaired Phonation: Low vocal intensity Intelligibility: Intelligibility reduced Word: 50-74% accurate Phrase: 25-49% accurate Motor Speech Errors: Not applicable Effective Techniques: Increased vocal intensity   GO                   Charlane Ferretti., CCC-SLP [phone]  Sneijder Bernards 08/01/2015, 4:53 PM

## 2015-08-01 NOTE — Progress Notes (Signed)
Patient is DTV at 1700. Unable to emptied bladder at this time. Bladder scanned of greater than 200cc. IN&OUT of 300 of tea color urine. No odor noted. Pt will be DTV at 2300PM.    Sim BoastHavy, RN

## 2015-08-01 NOTE — Clinical Social Work Placement (Signed)
   CLINICAL SOCIAL WORK PLACEMENT  NOTE  Date:  08/01/2015  Patient Details  Name: Kyle [name] MRN: [id number] Date of Birth: 01/24/1927  Clinical Social Work is seeking post-discharge placement for this patient at the Skilled  Nursing Facility level of care (*CSW will initial, date and re-position this form in  chart as items are completed):  Yes   Patient/family provided with [facility] Clinical Social Work Department's list of facilities offering this level of care within the geographic area requested by the patient (or if unable, by the patient's family).  Yes   Patient/family informed of their freedom to choose among providers that offer the needed level of care, that participate in Medicare, Medicaid or managed care program needed by the patient, have an available bed and are willing to accept the patient.  Yes   Patient/family informed of 's ownership interest in [facility] and [facility], as well as of the fact that they are under no obligation to receive care at these facilities.  PASRR submitted to EDS on       PASRR number received on       Existing PASRR number confirmed on 08/01/15     FL2 transmitted to all facilities in geographic area requested by pt/family on 08/01/15     FL2 transmitted to all facilities within larger geographic area on       Patient informed that his/her managed care company has contracts with or will negotiate with certain facilities, including the following:            Patient/family informed of bed offers received.  Patient chooses bed at       Physician recommends and patient chooses bed at      Patient to be transferred to   on  .  Patient to be transferred to facility by       Patient family notified on   of transfer.  Name of family member notified:        PHYSICIAN Please prepare priority discharge summary, including medications, Please prepare prescriptions, Please sign FL2     Additional Comment:     _______________________________________________ Roddie McBryant Chanele Douglas MSW, LCSW, [place], [phone]

## 2015-08-01 NOTE — Progress Notes (Signed)
Patient Name: Kyle EdouardMyrl Weirauch Date of Encounter: 08/01/2015   SUBJECTIVE  Sleepy but answers questions appropriately. No chest pain, sob or palpitation. Rate is up since this morning due to activity.   CURRENT MEDS . alfuzosin  10 mg Oral BID  . antiseptic oral rinse  7 mL Mouth Rinse q12n4p  . atenolol  25 mg Oral Daily  . atorvastatin  10 mg Oral Daily  . chlorhexidine  15 mL Mouth Rinse BID  . levETIRAcetam  500 mg Oral BID  . neomycin-bacitracin-polymyxin   Topical TID  . tiotropium  18 mcg Inhalation Daily    OBJECTIVE  Filed Vitals:   08/01/15 0127 08/01/15 0506 08/01/15 0940 08/01/15 1013  BP: 135/85 130/73  142/84  Pulse: 99 89  96  Temp: 98.9 F (37.2 C) 98.3 F (36.8 C)  97.9 F (36.6 C)  TempSrc: Oral Oral  Oral  Resp: 18 18  18   Height:      Weight:      SpO2: 95% 98% 97% 93%    Intake/Output Summary (Last 24 hours) at 08/01/15 1148 Last data filed at 08/01/15 [id number]  Gross per 24 hour  Intake    150 ml  Output    735 ml  Net   -585 ml   Filed Weights   07/28/15 1700 07/28/15 1718  Weight: 149 lb 4 oz (67.7 kg) 149 lb 4 oz (67.7 kg)    PHYSICAL EXAM  General: frail chronically ill appearing and sleepy male in NAD. Neuro: Alert and oriented X 3. Moves all extremities spontaneously. Psych: Normal affect. HEENT:  Face bruised Neck: Supple without bruits or JVD. Lungs:  Resp regular and unlabored, CTA. Heart: I rI Ir with tachycardia, no s3, s4, or murmurs. Abdomen: Soft, non-tender, non-distended, BS + x 4.  Extremities: No clubbing, cyanosis or edema. DP/PT/Radials 2+ and equal bilaterally.  Accessory Clinical Findings  CBC  Recent Labs  07/31/15 0341  WBC 12.2*  HGB 11.1*  HCT 33.5*  MCV 94.4  PLT 213   Basic Metabolic Panel  Recent Labs  07/31/15 0341  NA 143  K 3.7  CL 107  CO2 27  GLUCOSE 142*  BUN 23*  CREATININE 0.90  CALCIUM 8.9   Liver Function Tests  Recent Labs  07/31/15 0341  AST 37  ALT 39  ALKPHOS 48    BILITOT 1.7*  PROT 6.7  ALBUMIN 3.2*    TELE  afib at rate of 100-120s currently  Radiology/Studies  Ct Head Wo Contrast  07/28/2015  CLINICAL DATA:  Fall. EXAM: CT HEAD WITHOUT CONTRAST CT MAXILLOFACIAL WITHOUT CONTRAST CT CERVICAL SPINE WITHOUT CONTRAST TECHNIQUE: Multidetector CT imaging of the head, cervical spine, and maxillofacial structures were performed using the standard protocol without intravenous contrast. Multiplanar CT image reconstructions of the cervical spine and maxillofacial structures were also generated. COMPARISON:  CT scan of head of February 09, 2013. FINDINGS: CT HEAD FINDINGS Mildly depressed and comminuted frontal skull fracture is noted. Mild bilateral frontal subarachnoid hemorrhage is noted. Mild diffuse cortical atrophy is noted. Mild chronic ischemic white matter disease is noted. Ventricular size is within normal limits. No midline shift is noted. Possible 1 cm left frontal intraparenchymal hemorrhage is noted. CT MAXILLOFACIAL FINDINGS The mandible appears normal moderately depressed left nasal bone fracture is noted. Mildly displaced left zygomatic arch fracture is noted, with mildly displaced left posterior maxillary wall fracture. Left pterygoid plate appears normal. Moderately displaced and comminuted fracture is seen involving right pterygoid plate with associated  fracture of the posterior wall of the right maxillary sinus. Minimally displaced fracture is seen involving the right lamina papyracea. Moderately displaced right anterior maxillary wall fracture is noted. Hemorrhage is noted in both maxillary sinuses as well as probably in the ethmoid and sphenoid sinuses. Fractures are seen involving the lateral orbital walls bilaterally. Mildly displaced fractures are seen involving the superior orbital roofs bilaterally. Mildly displaced right orbital floor fracture is noted. Large amount of soft tissue gas is seen in the soft tissues lateral to the right maxillary  sinus. CT CERVICAL SPINE FINDINGS No significant spondylolisthesis is noted. Mildly displaced fracture is seen involving the posterior spinous process of C5. Mild degenerative disc disease is noted at C3-4, C5-6 and C6-7. Visualized lung apices are unremarkable. IMPRESSION: Mild diffuse cortical atrophy. Mild chronic ischemic white matter disease. Mild bilateral frontal subarachnoid hemorrhage is noted. Probable 1 cm left frontal intraparenchymal hemorrhage is noted. No midline shift is noted. Small amount of pneumocephalus is noted. Mildly depressed and comminuted bilateral frontal skull fracture is noted. Moderately depressed left nasal bone fracture. Mildly displaced left zygomatic arch fracture. Mildly displaced left posterior maxillary wall fracture. Moderately displaced and comminuted fracture is seen involving the posterior wall of the right maxillary sinus and the right pterygoid plate. Hemorrhage is noted in both maxillary, ethmoid and sphenoid sinuses. Minimally displaced fracture seen involving the right lamina papyracea. Moderately displaced right anterior maxillary wall fracture is noted. Mildly displaced fractures are seen involving the superior orbital roofs bilaterally. Mildly displaced right orbital floor fracture is noted. Mildly displaced bilateral lateral orbital wall fractures. Mildly displaced fracture is seen involving posterior spinous process of C5. Multilevel degenerative disc disease is noted in the cervical spine. No significant spondylolisthesis is noted. Critical Value/emergent results were called by telephone at the time of interpretation on 07/28/2015 at 5:43 pm to Dr. Bary Castilla , who verbally acknowledged these results. Electronically Signed   By: Lupita Raider, M.D.   On: 07/28/2015 17:44   Ct Cervical Spine Wo Contrast  07/28/2015  CLINICAL DATA:  Fall. EXAM: CT HEAD WITHOUT CONTRAST CT MAXILLOFACIAL WITHOUT CONTRAST CT CERVICAL SPINE WITHOUT CONTRAST TECHNIQUE:  Multidetector CT imaging of the head, cervical spine, and maxillofacial structures were performed using the standard protocol without intravenous contrast. Multiplanar CT image reconstructions of the cervical spine and maxillofacial structures were also generated. COMPARISON:  CT scan of head of February 09, 2013. FINDINGS: CT HEAD FINDINGS Mildly depressed and comminuted frontal skull fracture is noted. Mild bilateral frontal subarachnoid hemorrhage is noted. Mild diffuse cortical atrophy is noted. Mild chronic ischemic white matter disease is noted. Ventricular size is within normal limits. No midline shift is noted. Possible 1 cm left frontal intraparenchymal hemorrhage is noted. CT MAXILLOFACIAL FINDINGS The mandible appears normal moderately depressed left nasal bone fracture is noted. Mildly displaced left zygomatic arch fracture is noted, with mildly displaced left posterior maxillary wall fracture. Left pterygoid plate appears normal. Moderately displaced and comminuted fracture is seen involving right pterygoid plate with associated fracture of the posterior wall of the right maxillary sinus. Minimally displaced fracture is seen involving the right lamina papyracea. Moderately displaced right anterior maxillary wall fracture is noted. Hemorrhage is noted in both maxillary sinuses as well as probably in the ethmoid and sphenoid sinuses. Fractures are seen involving the lateral orbital walls bilaterally. Mildly displaced fractures are seen involving the superior orbital roofs bilaterally. Mildly displaced right orbital floor fracture is noted. Large amount of soft tissue gas is seen in  the soft tissues lateral to the right maxillary sinus. CT CERVICAL SPINE FINDINGS No significant spondylolisthesis is noted. Mildly displaced fracture is seen involving the posterior spinous process of C5. Mild degenerative disc disease is noted at C3-4, C5-6 and C6-7. Visualized lung apices are unremarkable. IMPRESSION: Mild  diffuse cortical atrophy. Mild chronic ischemic white matter disease. Mild bilateral frontal subarachnoid hemorrhage is noted. Probable 1 cm left frontal intraparenchymal hemorrhage is noted. No midline shift is noted. Small amount of pneumocephalus is noted. Mildly depressed and comminuted bilateral frontal skull fracture is noted. Moderately depressed left nasal bone fracture. Mildly displaced left zygomatic arch fracture. Mildly displaced left posterior maxillary wall fracture. Moderately displaced and comminuted fracture is seen involving the posterior wall of the right maxillary sinus and the right pterygoid plate. Hemorrhage is noted in both maxillary, ethmoid and sphenoid sinuses. Minimally displaced fracture seen involving the right lamina papyracea. Moderately displaced right anterior maxillary wall fracture is noted. Mildly displaced fractures are seen involving the superior orbital roofs bilaterally. Mildly displaced right orbital floor fracture is noted. Mildly displaced bilateral lateral orbital wall fractures. Mildly displaced fracture is seen involving posterior spinous process of C5. Multilevel degenerative disc disease is noted in the cervical spine. No significant spondylolisthesis is noted. Critical Value/emergent results were called by telephone at the time of interpretation on 07/28/2015 at 5:43 pm to Dr. Bary Castilla , who verbally acknowledged these results. Electronically Signed   By: Lupita Raider, M.D.   On: 07/28/2015 17:44   Dg Hand 2 View Left  07/28/2015  CLINICAL DATA:  Larey Seat today, pain at MCP joints, bruising EXAM: LEFT HAND - 2 VIEW COMPARISON:  LEFT wrist radiographs 05/19/2014 FINDINGS: Diffuse osseous demineralization. Scattered degenerative changes of interphalangeal joints. Osteolysis versus resection of trapezium. Degenerative changes first and second MCP joints. No acute fracture, dislocation, or bone destruction. Soft tissue swelling at distal phalanx index finger.  Corticated ossicle noted dorsal to mid carpals appear present since 2016. Additional osseous density dorsal to the distal carpal row, seen only on lateral view, was not definitely visualized previously and is indeterminate in age. IMPRESSION: Osseous demineralization with scattered degenerative changes as above. Additional calcific density is seen dorsal to the distal carpal row on lateral view new since prior exam, age-indeterminate ; correlation for pain/tenderness at this site recommended to exclude acute fracture. Electronically Signed   By: Ulyses Southward M.D.   On: 07/28/2015 18:39   Dg Chest Portable 1 View  07/28/2015  CLINICAL DATA:  82 year old male with fall. EXAM: PORTABLE CHEST 1 VIEW COMPARISON:  07/28/2015 and prior radiographs FINDINGS: The cardiomediastinal silhouette is unremarkable. Right lower lung atelectasis versus airspace disease noted. There is no evidence of pulmonary edema, suspicious pulmonary nodule/mass, pleural effusion, or pneumothorax. No acute bony abnormalities are identified. IMPRESSION: Right lower lung atelectasis versus airspace disease. No evidence of pneumothorax. Electronically Signed   By: Harmon Pier M.D.   On: 07/28/2015 18:32   Dg Chest Portable 1 View  07/28/2015  CLINICAL DATA:  Fall EXAM: PORTABLE CHEST 1 VIEW COMPARISON:  05/18/2014 chest radiograph. FINDINGS: A portion of the costophrenic angles is excluded from the image. Stable cardiomediastinal silhouette with normal heart size. There is a nonspecific lucency at the right lung base, cannot exclude a small basilar right pneumothorax. No left pneumothorax. No pleural effusion. Hyperinflated lungs. Emphysema. No pulmonary edema. Mild curvilinear opacities at the right lung base. Stable small granuloma in the left mid lung. No displaced fractures in the visualized chest, noting  limited visualization of the right lower ribs due to overlying wires. IMPRESSION: 1. Nonspecific lucency at the right lung base, cannot  exclude a small basilar right pneumothorax. Consider further evaluation with repeat upright PA and lateral chest radiographs and/or chest CT as clinically warranted . 2. Hyperinflated lungs and emphysema, suggesting COPD. Mild curvilinear opacity at the right lung base, probably atelectasis or scarring. These results were called by telephone at the time of interpretation on 07/28/2015 at 5:55 pm to Dr. Bary Castilla , who verbally acknowledged these results. Electronically Signed   By: Delbert Phenix M.D.   On: 07/28/2015 17:56   Dg Cerv Spine Flex&ext Only  07/31/2015  CLINICAL DATA:  Trauma, clearance films EXAM: CERVICAL SPINE - FLEXION AND EXTENSION VIEWS ONLY COMPARISON:  CT cervical spine 07/28/2015 FINDINGS: Prevertebral soft tissues normal thickness. Vertebral body heights maintained. Scattered disc space narrowing greatest at C5-C6 and C6-C7 where endplate spurs are identified. No acute fracture or subluxation. Little collection is demonstrated. No abnormal motion is seen with demonstrated flexion or extension. IMPRESSION: Degenerative disc disease changes of the cervical spine as above. No abnormal motion identified with flexion or extension. Electronically Signed   By: Ulyses Southward M.D.   On: 07/31/2015 13:05   Ct Maxillofacial Wo Cm  07/28/2015  CLINICAL DATA:  Fall. EXAM: CT HEAD WITHOUT CONTRAST CT MAXILLOFACIAL WITHOUT CONTRAST CT CERVICAL SPINE WITHOUT CONTRAST TECHNIQUE: Multidetector CT imaging of the head, cervical spine, and maxillofacial structures were performed using the standard protocol without intravenous contrast. Multiplanar CT image reconstructions of the cervical spine and maxillofacial structures were also generated. COMPARISON:  CT scan of head of February 09, 2013. FINDINGS: CT HEAD FINDINGS Mildly depressed and comminuted frontal skull fracture is noted. Mild bilateral frontal subarachnoid hemorrhage is noted. Mild diffuse cortical atrophy is noted. Mild chronic ischemic white  matter disease is noted. Ventricular size is within normal limits. No midline shift is noted. Possible 1 cm left frontal intraparenchymal hemorrhage is noted. CT MAXILLOFACIAL FINDINGS The mandible appears normal moderately depressed left nasal bone fracture is noted. Mildly displaced left zygomatic arch fracture is noted, with mildly displaced left posterior maxillary wall fracture. Left pterygoid plate appears normal. Moderately displaced and comminuted fracture is seen involving right pterygoid plate with associated fracture of the posterior wall of the right maxillary sinus. Minimally displaced fracture is seen involving the right lamina papyracea. Moderately displaced right anterior maxillary wall fracture is noted. Hemorrhage is noted in both maxillary sinuses as well as probably in the ethmoid and sphenoid sinuses. Fractures are seen involving the lateral orbital walls bilaterally. Mildly displaced fractures are seen involving the superior orbital roofs bilaterally. Mildly displaced right orbital floor fracture is noted. Large amount of soft tissue gas is seen in the soft tissues lateral to the right maxillary sinus. CT CERVICAL SPINE FINDINGS No significant spondylolisthesis is noted. Mildly displaced fracture is seen involving the posterior spinous process of C5. Mild degenerative disc disease is noted at C3-4, C5-6 and C6-7. Visualized lung apices are unremarkable. IMPRESSION: Mild diffuse cortical atrophy. Mild chronic ischemic white matter disease. Mild bilateral frontal subarachnoid hemorrhage is noted. Probable 1 cm left frontal intraparenchymal hemorrhage is noted. No midline shift is noted. Small amount of pneumocephalus is noted. Mildly depressed and comminuted bilateral frontal skull fracture is noted. Moderately depressed left nasal bone fracture. Mildly displaced left zygomatic arch fracture. Mildly displaced left posterior maxillary wall fracture. Moderately displaced and comminuted fracture is  seen involving the posterior wall of the right maxillary  sinus and the right pterygoid plate. Hemorrhage is noted in both maxillary, ethmoid and sphenoid sinuses. Minimally displaced fracture seen involving the right lamina papyracea. Moderately displaced right anterior maxillary wall fracture is noted. Mildly displaced fractures are seen involving the superior orbital roofs bilaterally. Mildly displaced right orbital floor fracture is noted. Mildly displaced bilateral lateral orbital wall fractures. Mildly displaced fracture is seen involving posterior spinous process of C5. Multilevel degenerative disc disease is noted in the cervical spine. No significant spondylolisthesis is noted. Critical Value/emergent results were called by telephone at the time of interpretation on 07/28/2015 at 5:43 pm to Dr. Bary Castilla , who verbally acknowledged these results. Electronically Signed   By: Lupita Raider, M.D.   On: 07/28/2015 17:44    ASSESSMENT AND PLAN 1) AFib: rate elevated this morning due to activity, coming down currently.  No anticoagulation at this time due to multiple trauma. Consider increasing dose of atonolol to 50mg . BP is stable.   Signed, Bhagat,Bhavinkumar PA-C Pager [phone]  I have examined the patient and reviewed assessment and plan and discussed with patient.  Agree with above as stated.  Patient is confused.  Hemodynamically stable.  Rate increased while awake, but he is asymptomatic. Bruised face.  Increase atenolol to 25 mg BID.  VARANASI,JAYADEEP S.

## 2015-08-01 NOTE — Care Management Note (Addendum)
Case Management Note  Patient Details  Name: Kyle EdouardMyrl Dudash MRN: [id number] Date of Birth: 10/18/1926  Subjective/Objective:   Pt admitted on 07/28/15 s/p ground level fall with C5 fx, SAH, multiple facial fractures.  PTA, pt independent, lives with wife, who has Alzheimer's Disease.  They have a supportive son, [name], who assists them as needed.                  Action/Plan: PT/OT recommending SNF at dc, and CSW consulted to facilitate dc to SNF when medically stable.  Son states they would like [facility], as pt has been there before.  Notified CSW of pt/family's first choice for SNF.    Expected Discharge Date:                  Expected Discharge Plan:  Skilled Nursing Facility  In-House Referral:  Clinical Social Work  Discharge planning Services  CM Consult  Post Acute Care Choice:    Choice offered to:     DME Arranged:    DME Agency:     HH Arranged:    HH Agency:     Status of Service:  In process, will continue to follow  Medicare Important Message Given:  Other (see comment) Date Medicare IM Given:    Medicare IM give by:    Date Additional Medicare IM Given:    Additional Medicare Important Message give by:     If discussed at Long Length of Stay Meetings, dates discussed:    Additional Comments:  Quintella BatonJulie W. Corinthian Kemler, RN, BSN  Trauma/Neuro ICU Case Manager [phone]

## 2015-08-01 NOTE — Evaluation (Addendum)
Occupational Therapy Evaluation Patient Details Name: Kyle EdouardMyrl [name] MRN: [id number] DOB: 11/12/1926 Today's Date: 08/01/2015    History of Present Illness Patient is an 82 y.o. male with PMH of fall with L femur fx 1/16, R RCT, COPD, HTN, hyperlipidemia, Rt shoulder arthroplasty, SOB, OA, PAF, afib, THA who presented s/p fall at home.  CT scan was done which shows a frontal bone fracture, subarachnoid hemorrhage, multiple facial fractures, transverse process of C5, and significant bruising of the face.    Clinical Impression   Pt admitted with above. Pt reports he was independent with ADLs, PTA. Feel pt will benefit from acute OT to increase independence prior to d/c. Recommending SNF for rehab. Pt appeared to be a [place] Level VI (confused/appropriate).    Follow Up Recommendations  SNF;Supervision/Assistance - 24 hour    Equipment Recommendations  Other (comment) (defer to next venue)    Recommendations for Other Services       Precautions / Restrictions Precautions Precautions: Fall Restrictions Weight Bearing Restrictions: No      Mobility Bed Mobility               General bed mobility comments: up in chair  Transfers Overall transfer level: Needs assistance Equipment used: Rolling walker (2 wheeled) Transfers: Sit to/from Stand Sit to Stand: +2 physical assistance;Max assist         General transfer comment: max-mod of 2 due to fear of falling, poor anterior weight shift and soreness R shoulder and leg; did not want to remain standing reported fear of falling    Balance Overall balance assessment: Needs assistance Sitting-balance support: Feet supported;Bilateral upper extremity supported Sitting balance-Leahy Scale: Poor Sitting balance - Comments: needed UE support to lean away from back of chair, was able to reach partly to his feet   Standing balance support: Bilateral upper extremity supported Standing balance-Leahy Scale: Poor Standing balance  comment: UE support and mod A for balance in standing                            ADL Overall ADL's : Needs assistance/impaired     Grooming: Wash/dry face;Sitting;Minimal assistance Grooming Details (indicate cue type and reason): washed off face with supervision and set up assist.              Lower Body Dressing: +2 for physical assistance;Maximal assistance   Toilet Transfer: +2 for physical assistance;Maximal assistance;RW (sit to stand from chair)           Functional mobility during ADLs: Maximal assistance;+2 for physical assistance;Rolling walker (sit to stand from chair)       Vision     Perception     Praxis      Pertinent Vitals/Pain Pain Assessment: Faces Faces Pain Scale: Hurts even more Pain Location: Rt shoulder and Rt thigh with movement Pain Descriptors / Indicators: Grimacing Pain Intervention(s): Repositioned;Monitored during session;Limited activity within patient's tolerance     Hand Dominance Right   Extremity/Trunk Assessment Upper Extremity Assessment Upper Extremity Assessment: RUE deficits/detail;LUE deficits/detail RUE Deficits / Details: pain with movement of shoulder LUE Deficits / Details: edema and bruising noted in Lt hand   Lower Extremity Assessment Lower Extremity Assessment: Defer to PT evaluation RLE Deficits / Details: AAROM grossly WFL, but demonstrates limited movement due to pain in R thigh RLE Sensation: decreased light touch (on foot) LLE Deficits / Details: AAROM grossly WFL, strength at least 3/5 testing limited due to cognition  Cervical / Trunk Assessment Cervical / Trunk Assessment: Kyphotic   Communication Communication Communication: HOH   Cognition Arousal/Alertness: Awake/alert Behavior During Therapy: WFL for tasks assessed/performed Overall Cognitive Status: No family/caregiver present to determine baseline cognitive functioning Area of Impairment: Orientation;Problem solving;Following  commands Orientation Level: Disoriented to;Place   Following Commands: Follows one step commands with increased time     Problem Solving: Slow processing;Requires verbal cues General Comments: perseverated about how wife showers, each time questioned about his functioning at home, reported about how his wife functions   General Comments       Exercises       Shoulder Instructions      Home Living Family/patient expects to be discharged to:: Unsure                                Bathroom: Sounds like he has a walk-in shower and tub        Prior Functioning/Environment Level of Independence: Independent        Comments: reports was independent at home, states had to assist wife at times, but not always, had a walker, but not using currently    OT Diagnosis: Acute pain;Generalized weakness   OT Problem List: Decreased strength;Pain;Impaired UE functional use;Decreased knowledge of use of DME or AE;Decreased knowledge of precautions;Decreased cognition;Increased edema;Impaired balance (sitting and/or standing);Decreased range of motion;Decreased activity tolerance   OT Treatment/Interventions: Self-care/ADL training;DME and/or AE instruction;Therapeutic activities;Balance training;Patient/family education;Cognitive remediation/compensation;Energy conservation    OT Goals(Current goals can be found in the care plan section) Acute Rehab OT Goals Patient Stated Goal: To go where he can have more help OT Goal Formulation: With patient Time For Goal Achievement: 08/16/2015 Potential to Achieve Goals: Good ADL Goals Pt Will Perform Lower Body Bathing: sit to/from stand;with adaptive equipment;with mod assist Pt Will Perform Lower Body Dressing: sit to/from stand;with adaptive equipment;with mod assist Pt Will Transfer to Toilet: ambulating;with mod assist;bedside commode Pt Will Perform Toileting - Clothing Manipulation and hygiene: with mod assist;sit to/from stand   OT Frequency: Min 2X/week   Barriers to D/C:            Co-evaluation PT/OT/SLP Co-Evaluation/Treatment: Yes Reason for Co-Treatment: Complexity of the patient's impairments (multi-system involvement);For patient/therapist safety PT goals addressed during session: Mobility/safety with mobility;Balance;Strengthening/ROM OT goals addressed during session: Other (comment);ADL's and self-care (mobility)      End of Session Equipment Utilized During Treatment: Gait belt;Rolling walker Nurse Communication: Mobility status  Activity Tolerance: Patient limited by pain;Other (comment) (pt HR up to 162) Patient left: in chair;with call bell/phone within reach;with chair alarm set   Time: 1610-9604 OT Time Calculation (min): 25 min Charges:  OT General Charges $OT Visit: 1 Procedure OT Evaluation $OT Eval Moderate Complexity: 1 Procedure G-CodesEarlie Raveling OTR/L [phone] 08/01/2015, 2:03 PM

## 2015-08-01 NOTE — Progress Notes (Signed)
Patient ID: Aletta EdouardMyrl Canavan, male   DOB: 02/03/1927, 82 y.o.   MRN: [id number]   LOS: 4 days   Subjective: Wanting to get up and sit with his wife (not in room)   Objective: Vital signs in last 24 hours: Temp:  [97.8 F (36.6 C)-99.3 F (37.4 C)] 98.3 F (36.8 C) (04/04 0506) Pulse Rate:  [72-99] 89 (04/04 0506) Resp:  [15-26] 18 (04/04 0506) BP: (128-149)/(73-88) 130/73 mmHg (04/04 0506) SpO2:  [87 %-98 %] 98 % (04/04 0506) Last BM Date:  (pta)   Physical Exam General appearance: no distress Resp: clear to auscultation bilaterally Cardio: irregularly irregular rhythm GI: normal findings: bowel sounds normal and soft, non-tender   Assessment/Plan: Fall TBI -- Stable, awaiting TBI team eval Facial fxs -- Non-operative per Dr. Emeline DarlingGore C5 SP fx -- Flex/ex negative Multiple medical problems -- Home meds except coumadin FEN -- D/C foley VTE -- SCD's Dispo -- TBI team    Freeman CaldronMichael J. Soliana Kitko, PA-C Pager: [phone] General Trauma PA Pager: [phone]  08/01/2015

## 2015-08-01 NOTE — Clinical Social Work Note (Signed)
Clinical Social Work Assessment  Patient Details  Name: Kyle EdouardMyrl [name] MRN: [id number] Date of Birth: 03/31/1927  Date of referral:  08/01/15               Reason for consult:  Trauma, Discharge Planning, Facility Placement                Permission sought to share information with:  Family Supports, Oceanographeracility Contact Representative Permission granted to share information::  Yes, Verbal Permission Granted  Name::     Advertising copywriterWilliam  Agency::  SNFs  Relationship::  Son  SolicitorContact Information:     Housing/Transportation Living arrangements for the past 2 months:  Single Family Home Source of Information:  Patient, Adult Children Patient Interpreter Needed:  None Criminal Activity/Legal Involvement Pertinent to Current Situation/Hospitalization:  No - Comment as needed Significant Relationships:  Adult Children, Spouse Lives with:  Spouse, Adult Children Do you feel safe going back to the place where you live?  Yes Need for family participation in patient care:  Yes (Comment)  Care giving concerns:  The patient expresses concerns about his wife at home, as he shares that the wife is a Chartered loss adjusterhoarder. The patient's son does not express any concerns at this time, but is agreeable to SNF placement if the patient is agreeable.   Social Worker assessment / plan:  CSW me with patient at bedside to complete assessment. The patient was resting comfortably in bedside recliner. The patient presented to the trauma service after experiencing a fall at home. CSW explained reason for visit and patient was agreeable to discussing SNF placement. The patient reports that he has received PT and OT in the past but this was done at home. CSW explained SNF search/placement process to patient and requested the patient's verbal consent to contact his family (wife and son) to include them in the discharge planning process as well. The patient is agreeable to this.   CSW spoke with patient's son [name], who also agrees that placement  may be needed at discharge but would like to discuss this further with the patient. CSW will followup with available bed offers.   Employment status:  Retired Health and safety inspectornsurance information:  [employer] PT Recommendations:  Skilled Nursing Facility Information / Referral to community resources:  Skilled Nursing Facility  Patient/Family's Response to care:  The patient is happy with the care he has received. CSW and son are appreciative of CSW assistance with discharge needs.  Patient/Family's Understanding of and Emotional Response to Diagnosis, Current Treatment, and Prognosis:  The patient has a fair understanding of the reason for his admission and his post discharge needs. The patient's son appears to have a good understanding of the reason for the patient's admission and post DC need.   Emotional Assessment Appearance:  Appears stated age, Other (Comment Required (Had multiple bruises on his face and neck.) Attitude/Demeanor/Rapport:  Other (The patient was pleasant and engaged with CSW, though he seems somewhat confused.) Affect (typically observed):  Flat, Calm, Accepting, Pleasant Orientation:  Oriented to Self, Oriented to Place, Oriented to  Time, Oriented to Situation Alcohol / Substance use:  Not Applicable Psych involvement (Current and /or in the community):  No (Comment)  Discharge Needs  Concerns to be addressed:  Discharge Planning Concerns Readmission within the last 30 days:  No Current discharge risk:  Chronically ill, Physical Impairment Barriers to Discharge:  Continued Medical Work up   The KrogerBryant Anetta Olvera MSW, [place]CS[place], [place], [phone]

## 2015-08-01 NOTE — Progress Notes (Signed)
Staffs attempted to assist patient with breakfast but he declined at this time asking for his wife. Will continue to monitor.   Sim BoastHavy, RN

## 2015-08-01 NOTE — Progress Notes (Signed)
Physical Therapy Treatment Patient Details Name: Kyle [name] Duey MRN: [id number] DOB: 02/20/1927 Today's Date: 08/01/2015    History of Present Illness Patient is an 82 y.o. male with PMH of fall with L femur fx 1/16, R RCT, COPD, HTN, hyperlipidemia, Rt shoulder arthroplasty, SOB, OA, PAF, afib, THA who presented s/p fall at home.  CT scan was done which shows a frontal bone fracture, subarachnoid hemorrhage, multiple facial fractures, transverse process of C5, and significant bruising of the face.     PT Comments    Focus of PT session was safe transition from bed to chair. PT entered as pt was attempting to get out of bed without assistance, with the bed alarm sounding. Pt required +2 assist for transfer to standing, and was very guarded in taking pivotal steps around to the recliner. Recommend use of gait belt for safety with staff. Will continue to follow.   Follow Up Recommendations  SNF     Equipment Recommendations  Other (comment) (TBD by next venue of care)    Recommendations for Other Services       Precautions / Restrictions Precautions Precautions: Fall Restrictions Weight Bearing Restrictions: No    Mobility  Bed Mobility Overal bed mobility: Needs Assistance Bed Mobility: Supine to Sit     Supine to sit: Min assist;+2 for physical assistance     General bed mobility comments: PT entered as pt was trying to get out of bed on his own - bed alarm was active. Pt able to swing his legs off EOB without assistance but required assist to elevate trunk to full sitting position. Pt painful in R shoulder upon transition.   Transfers Overall transfer level: Needs assistance Equipment used: 2 person hand held assist Transfers: Sit to/from UGI CorporationStand;Stand Pivot Transfers Sit to Stand: Mod assist;+2 physical assistance Stand pivot transfers: Mod assist;+2 physical assistance       General transfer comment: +2 mod assist required to achieve full stand and take pivotal steps around  to the chair. Pt very guarded with all OOB movement and expressed his fear of falling.   Ambulation/Gait             General Gait Details: Unable to attempt at this time.    Stairs            Wheelchair Mobility    Modified Rankin (Stroke Patients Only)       Balance Overall balance assessment: Needs assistance Sitting-balance support: Feet supported;Bilateral upper extremity supported Sitting balance-Leahy Scale: Poor Sitting balance - Comments: Required UE support to maintain upright posture.  Postural control: Posterior lean Standing balance support: Bilateral upper extremity supported;During functional activity Standing balance-Leahy Scale: Poor                      Cognition Arousal/Alertness: Awake/alert Behavior During Therapy: WFL for tasks assessed/performed Overall Cognitive Status: No family/caregiver present to determine baseline cognitive functioning Area of Impairment: Orientation;Problem solving;Following commands Orientation Level: Disoriented to;Place     Following Commands: Follows one step commands with increased time     Problem Solving: Slow processing;Requires verbal cues General Comments: Perseverating on where wife is and trying to get home    Exercises      General Comments        Pertinent Vitals/Pain Pain Assessment: Faces Faces Pain Scale: Hurts even more Pain Location: R shoulder and R thigh with movement Pain Descriptors / Indicators: Grimacing;Guarding Pain Intervention(s): Limited activity within patient's tolerance;Monitored during session;Repositioned    Home  Living Family/patient expects to be discharged to:: Unsure                    Prior Function Level of Independence: Independent      Comments: reports was independent at home, states had to assist wife at times, but not always, had a walker, but not using currently   PT Goals (current goals can now be found in the care plan section) Acute  Rehab PT Goals Patient Stated Goal: To go where he can have more help PT Goal Formulation: With patient Time For Goal Achievement: 09/07/2015 Potential to Achieve Goals: Fair Progress towards PT goals: Progressing toward goals    Frequency  Min 3X/week    PT Plan Current plan remains appropriate    Co-evaluation   Reason for Co-Treatment: Complexity of the patient's impairments (multi-system involvement);For patient/therapist safety   OT goals addressed during session: Other (comment);ADL's and self-care (mobility)     End of Session Equipment Utilized During Treatment: Gait belt Activity Tolerance: Patient limited by pain Patient left: in chair;with call bell/phone within reach;with chair alarm set     Time: 1311-1320 PT Time Calculation (min) (ACUTE ONLY): 9 min  Charges:  $Gait Training: 8-22 mins                    G Codes:      Conni Slipper 08/31/15, 2:38 PM  Conni Slipper, PT, DPT Acute Rehabilitation Services Pager: [phone]

## 2015-08-02 LAB — CBC WITH DIFFERENTIAL/PLATELET
BASOS PCT: 0 %
Basophils Absolute: 0 10*3/uL (ref 0.0–0.1)
EOS ABS: 0 10*3/uL (ref 0.0–0.7)
EOS PCT: 0 %
HCT: 32.3 % — ABNORMAL LOW (ref 39.0–52.0)
Hemoglobin: 10.8 g/dL — ABNORMAL LOW (ref 13.0–17.0)
LYMPHS ABS: 2.2 10*3/uL (ref 0.7–4.0)
Lymphocytes Relative: 15 %
MCH: 31 pg (ref 26.0–34.0)
MCHC: 33.4 g/dL (ref 30.0–36.0)
MCV: 92.8 fL (ref 78.0–100.0)
MONOS PCT: 12 %
Monocytes Absolute: 1.8 10*3/uL — ABNORMAL HIGH (ref 0.1–1.0)
Neutro Abs: 10.6 10*3/uL — ABNORMAL HIGH (ref 1.7–7.7)
Neutrophils Relative %: 73 %
PLATELETS: 246 10*3/uL (ref 150–400)
RBC: 3.48 MIL/uL — ABNORMAL LOW (ref 4.22–5.81)
RDW: 14.8 % (ref 11.5–15.5)
WBC: 14.8 10*3/uL — AB (ref 4.0–10.5)

## 2015-08-02 LAB — BASIC METABOLIC PANEL
Anion gap: 9 (ref 5–15)
BUN: 28 mg/dL — AB (ref 6–20)
CALCIUM: 8.8 mg/dL — AB (ref 8.9–10.3)
CO2: 27 mmol/L (ref 22–32)
CREATININE: 1.02 mg/dL (ref 0.61–1.24)
Chloride: 106 mmol/L (ref 101–111)
GFR calc Af Amer: >60 mL/min (ref >60)
Glucose, Bld: 109 mg/dL — ABNORMAL HIGH (ref 65–99)
Potassium: 3.4 mmol/L — ABNORMAL LOW (ref 3.5–5.1)
SODIUM: 142 mmol/L (ref 135–145)

## 2015-08-02 MED ORDER — SODIUM CHLORIDE 0.9 % IV SOLN
INTRAVENOUS | Status: DC
  Administered 2015-08-02: 14:00:00 via INTRAVENOUS
  Administered 2015-08-02: 1000 mL via INTRAVENOUS

## 2015-08-02 MED ORDER — ENSURE ENLIVE PO LIQD
237.0000 mL | Freq: Three times a day (TID) | ORAL | Status: DC
  Administered 2015-08-02 – 2015-08-03 (×3): 237 mL via ORAL
  Filled 2015-08-02 (×6): qty 237

## 2015-08-02 NOTE — Progress Notes (Signed)
Pt has had condom cath on the entire morning, no urine in collection bag. rn earlier did bladder scan which showed >200 ml in bladder.

## 2015-08-02 NOTE — Progress Notes (Signed)
Bladder scanned pt around 2300 showed over 400 ml performed in-out cath 410 ml of tea colored urine.

## 2015-08-02 NOTE — Clinical Social Work Note (Signed)
Clinical Social Worker continuing to follow patient and family for support and discharge planning needs.  Patient oriented to person and place only at this time.  CSW spoke with patient son over the phone to provide bed offers and [facility].  CSW contacted facility who is in agreement with patient admission under short term only.  CSW remains available for support and to facilitate patient discharge needs once medically stable.  Macario GoldsJesse Babatunde Seago, Kentucky[place]CSW [phone]

## 2015-08-02 NOTE — Progress Notes (Signed)
SUBJECTIVE:  Resting comfortably.  OBJECTIVE:   Vitals:   Filed Vitals:   08/01/15 2117 08/02/15 0205 08/02/15 0448 08/02/15 1042  BP: 120/78 105/55 107/62 124/80  Pulse: 91 99 92 95  Temp: 99.2 F (37.3 C) 99.1 F (37.3 C) 97.4 F (36.3 C) 97.5 F (36.4 C)  TempSrc: Oral Oral Axillary Axillary  Resp: Height:      Weight:      SpO2: 93% 82% 96% 98%   I&O's:   Intake/Output Summary (Last 24 hours) at 08/02/15 1153 Last data filed at 08/02/15 0500  Gross per 24 hour  Intake    600 ml  Output    590 ml  Net     10 ml   TELEMETRY: Reviewed telemetry pt in AFib, rate controlled.:     PHYSICAL EXAM General:  in no acute distress, frail Head:   Bruised face  Lungs:  No wheezing bilaterally to auscultation. Heart:   HRRR S1 S2  No JVD.   Abdomen: abdomen soft and non-tender Msk:  Back normal,  Normal strength and tone for age. Extremities:   No edema.   Neuro: sleepy Psych:  sleepy Skin: Bruised   LABS: Basic Metabolic Panel:  Recent Labs  16/10/96 0341  NA 143  K 3.7  CL 107  CO2 27  GLUCOSE 142*  BUN 23*  CREATININE 0.90  CALCIUM 8.9   Liver Function Tests:  Recent Labs  07/31/15 0341  AST 37  ALT 39  ALKPHOS 48  BILITOT 1.7*  PROT 6.7  ALBUMIN 3.2*   No results for input(s): LIPASE, AMYLASE in the last 72 hours. CBC:  Recent Labs  07/31/15 0341  WBC 12.2*  HGB 11.1*  HCT 33.5*  MCV 94.4  PLT 213   Cardiac Enzymes: No results for input(s): CKTOTAL, CKMB, CKMBINDEX, TROPONINI in the last 72 hours. BNP: Invalid input(s): POCBNP D-Dimer: No results for input(s): DDIMER in the last 72 hours. Hemoglobin A1C: No results for input(s): HGBA1C in the last 72 hours. Fasting Lipid Panel: No results for input(s): CHOL, HDL, LDLCALC, TRIG, CHOLHDL, LDLDIRECT in the last 72 hours. Thyroid Function Tests: No results for input(s): TSH, T4TOTAL, T3FREE, THYROIDAB in the last 72 hours.  Invalid input(s): FREET3 Anemia  Panel: No results for input(s): VITAMINB12, FOLATE, FERRITIN, TIBC, IRON, RETICCTPCT in the last 72 hours. Coag Panel:   Lab Results  Component Value Date   INR 1.17 07/31/2015   INR 1.18 07/30/2015   INR 1.23 07/29/2015    RADIOLOGY: Ct Head Wo Contrast  07/28/2015  CLINICAL DATA:  Fall. EXAM: CT HEAD WITHOUT CONTRAST CT MAXILLOFACIAL WITHOUT CONTRAST CT CERVICAL SPINE WITHOUT CONTRAST TECHNIQUE: Multidetector CT imaging of the head, cervical spine, and maxillofacial structures were performed using the standard protocol without intravenous contrast. Multiplanar CT image reconstructions of the cervical spine and maxillofacial structures were also generated. COMPARISON:  CT scan of head of February 09, 2013. FINDINGS: CT HEAD FINDINGS Mildly depressed and comminuted frontal skull fracture is noted. Mild bilateral frontal subarachnoid hemorrhage is noted. Mild diffuse cortical atrophy is noted. Mild chronic ischemic white matter disease is noted. Ventricular size is within normal limits. No midline shift is noted. Possible 1 cm left frontal intraparenchymal hemorrhage is noted. CT MAXILLOFACIAL FINDINGS The mandible appears normal moderately depressed left nasal bone fracture is noted. Mildly displaced left zygomatic arch fracture is noted, with mildly displaced left posterior maxillary wall fracture. Left pterygoid plate appears normal. Moderately displaced and comminuted  fracture is seen involving right pterygoid plate with associated fracture of the posterior wall of the right maxillary sinus. Minimally displaced fracture is seen involving the right lamina papyracea. Moderately displaced right anterior maxillary wall fracture is noted. Hemorrhage is noted in both maxillary sinuses as well as probably in the ethmoid and sphenoid sinuses. Fractures are seen involving the lateral orbital walls bilaterally. Mildly displaced fractures are seen involving the superior orbital roofs bilaterally. Mildly displaced  right orbital floor fracture is noted. Large amount of soft tissue gas is seen in the soft tissues lateral to the right maxillary sinus. CT CERVICAL SPINE FINDINGS No significant spondylolisthesis is noted. Mildly displaced fracture is seen involving the posterior spinous process of C5. Mild degenerative disc disease is noted at C3-4, C5-6 and C6-7. Visualized lung apices are unremarkable. IMPRESSION: Mild diffuse cortical atrophy. Mild chronic ischemic white matter disease. Mild bilateral frontal subarachnoid hemorrhage is noted. Probable 1 cm left frontal intraparenchymal hemorrhage is noted. No midline shift is noted. Small amount of pneumocephalus is noted. Mildly depressed and comminuted bilateral frontal skull fracture is noted. Moderately depressed left nasal bone fracture. Mildly displaced left zygomatic arch fracture. Mildly displaced left posterior maxillary wall fracture. Moderately displaced and comminuted fracture is seen involving the posterior wall of the right maxillary sinus and the right pterygoid plate. Hemorrhage is noted in both maxillary, ethmoid and sphenoid sinuses. Minimally displaced fracture seen involving the right lamina papyracea. Moderately displaced right anterior maxillary wall fracture is noted. Mildly displaced fractures are seen involving the superior orbital roofs bilaterally. Mildly displaced right orbital floor fracture is noted. Mildly displaced bilateral lateral orbital wall fractures. Mildly displaced fracture is seen involving posterior spinous process of C5. Multilevel degenerative disc disease is noted in the cervical spine. No significant spondylolisthesis is noted. Critical Value/emergent results were called by telephone at the time of interpretation on 07/28/2015 at 5:43 pm to Dr. Bary Castilla , who verbally acknowledged these results. Electronically Signed   By: Lupita Raider, M.D.   On: 07/28/2015 17:44   Ct Cervical Spine Wo Contrast  07/28/2015  CLINICAL  DATA:  Fall. EXAM: CT HEAD WITHOUT CONTRAST CT MAXILLOFACIAL WITHOUT CONTRAST CT CERVICAL SPINE WITHOUT CONTRAST TECHNIQUE: Multidetector CT imaging of the head, cervical spine, and maxillofacial structures were performed using the standard protocol without intravenous contrast. Multiplanar CT image reconstructions of the cervical spine and maxillofacial structures were also generated. COMPARISON:  CT scan of head of February 09, 2013. FINDINGS: CT HEAD FINDINGS Mildly depressed and comminuted frontal skull fracture is noted. Mild bilateral frontal subarachnoid hemorrhage is noted. Mild diffuse cortical atrophy is noted. Mild chronic ischemic white matter disease is noted. Ventricular size is within normal limits. No midline shift is noted. Possible 1 cm left frontal intraparenchymal hemorrhage is noted. CT MAXILLOFACIAL FINDINGS The mandible appears normal moderately depressed left nasal bone fracture is noted. Mildly displaced left zygomatic arch fracture is noted, with mildly displaced left posterior maxillary wall fracture. Left pterygoid plate appears normal. Moderately displaced and comminuted fracture is seen involving right pterygoid plate with associated fracture of the posterior wall of the right maxillary sinus. Minimally displaced fracture is seen involving the right lamina papyracea. Moderately displaced right anterior maxillary wall fracture is noted. Hemorrhage is noted in both maxillary sinuses as well as probably in the ethmoid and sphenoid sinuses. Fractures are seen involving the lateral orbital walls bilaterally. Mildly displaced fractures are seen involving the superior orbital roofs bilaterally. Mildly displaced right orbital floor fracture is noted.  Large amount of soft tissue gas is seen in the soft tissues lateral to the right maxillary sinus. CT CERVICAL SPINE FINDINGS No significant spondylolisthesis is noted. Mildly displaced fracture is seen involving the posterior spinous process of C5.  Mild degenerative disc disease is noted at C3-4, C5-6 and C6-7. Visualized lung apices are unremarkable. IMPRESSION: Mild diffuse cortical atrophy. Mild chronic ischemic white matter disease. Mild bilateral frontal subarachnoid hemorrhage is noted. Probable 1 cm left frontal intraparenchymal hemorrhage is noted. No midline shift is noted. Small amount of pneumocephalus is noted. Mildly depressed and comminuted bilateral frontal skull fracture is noted. Moderately depressed left nasal bone fracture. Mildly displaced left zygomatic arch fracture. Mildly displaced left posterior maxillary wall fracture. Moderately displaced and comminuted fracture is seen involving the posterior wall of the right maxillary sinus and the right pterygoid plate. Hemorrhage is noted in both maxillary, ethmoid and sphenoid sinuses. Minimally displaced fracture seen involving the right lamina papyracea. Moderately displaced right anterior maxillary wall fracture is noted. Mildly displaced fractures are seen involving the superior orbital roofs bilaterally. Mildly displaced right orbital floor fracture is noted. Mildly displaced bilateral lateral orbital wall fractures. Mildly displaced fracture is seen involving posterior spinous process of C5. Multilevel degenerative disc disease is noted in the cervical spine. No significant spondylolisthesis is noted. Critical Value/emergent results were called by telephone at the time of interpretation on 07/28/2015 at 5:43 pm to Dr. Bary CastillaOURTENEY MACKUEN , who verbally acknowledged these results. Electronically Signed   By: Lupita RaiderJames  Green Jr, M.D.   On: 07/28/2015 17:44   Dg Hand 2 View Left  07/28/2015  CLINICAL DATA:  Larey SeatFell today, pain at MCP joints, bruising EXAM: LEFT HAND - 2 VIEW COMPARISON:  LEFT wrist radiographs 05/19/2014 FINDINGS: Diffuse osseous demineralization. Scattered degenerative changes of interphalangeal joints. Osteolysis versus resection of trapezium. Degenerative changes first and  second MCP joints. No acute fracture, dislocation, or bone destruction. Soft tissue swelling at distal phalanx index finger. Corticated ossicle noted dorsal to mid carpals appear present since 2016. Additional osseous density dorsal to the distal carpal row, seen only on lateral view, was not definitely visualized previously and is indeterminate in age. IMPRESSION: Osseous demineralization with scattered degenerative changes as above. Additional calcific density is seen dorsal to the distal carpal row on lateral view new since prior exam, age-indeterminate ; correlation for pain/tenderness at this site recommended to exclude acute fracture. Electronically Signed   By: Ulyses SouthwardMark  Boles M.D.   On: 07/28/2015 18:39   Dg Chest Portable 1 View  07/28/2015  CLINICAL DATA:  82 year old male with fall. EXAM: PORTABLE CHEST 1 VIEW COMPARISON:  07/28/2015 and prior radiographs FINDINGS: The cardiomediastinal silhouette is unremarkable. Right lower lung atelectasis versus airspace disease noted. There is no evidence of pulmonary edema, suspicious pulmonary nodule/mass, pleural effusion, or pneumothorax. No acute bony abnormalities are identified. IMPRESSION: Right lower lung atelectasis versus airspace disease. No evidence of pneumothorax. Electronically Signed   By: Harmon PierJeffrey  Hu M.D.   On: 07/28/2015 18:32   Dg Chest Portable 1 View  07/28/2015  CLINICAL DATA:  Fall EXAM: PORTABLE CHEST 1 VIEW COMPARISON:  05/18/2014 chest radiograph. FINDINGS: A portion of the costophrenic angles is excluded from the image. Stable cardiomediastinal silhouette with normal heart size. There is a nonspecific lucency at the right lung base, cannot exclude a small basilar right pneumothorax. No left pneumothorax. No pleural effusion. Hyperinflated lungs. Emphysema. No pulmonary edema. Mild curvilinear opacities at the right lung base. Stable small granuloma in the left mid  lung. No displaced fractures in the visualized chest, noting limited  visualization of the right lower ribs due to overlying wires. IMPRESSION: 1. Nonspecific lucency at the right lung base, cannot exclude a small basilar right pneumothorax. Consider further evaluation with repeat upright PA and lateral chest radiographs and/or chest CT as clinically warranted . 2. Hyperinflated lungs and emphysema, suggesting COPD. Mild curvilinear opacity at the right lung base, probably atelectasis or scarring. These results were called by telephone at the time of interpretation on 07/28/2015 at 5:55 pm to Dr. Bary Castilla , who verbally acknowledged these results. Electronically Signed   By: Delbert Phenix M.D.   On: 07/28/2015 17:56   Dg Cerv Spine Flex&ext Only  07/31/2015  CLINICAL DATA:  Trauma, clearance films EXAM: CERVICAL SPINE - FLEXION AND EXTENSION VIEWS ONLY COMPARISON:  CT cervical spine 07/28/2015 FINDINGS: Prevertebral soft tissues normal thickness. Vertebral body heights maintained. Scattered disc space narrowing greatest at C5-C6 and C6-C7 where endplate spurs are identified. No acute fracture or subluxation. Little collection is demonstrated. No abnormal motion is seen with demonstrated flexion or extension. IMPRESSION: Degenerative disc disease changes of the cervical spine as above. No abnormal motion identified with flexion or extension. Electronically Signed   By: Ulyses Southward M.D.   On: 07/31/2015 13:05   Ct Maxillofacial Wo Cm  07/28/2015  CLINICAL DATA:  Fall. EXAM: CT HEAD WITHOUT CONTRAST CT MAXILLOFACIAL WITHOUT CONTRAST CT CERVICAL SPINE WITHOUT CONTRAST TECHNIQUE: Multidetector CT imaging of the head, cervical spine, and maxillofacial structures were performed using the standard protocol without intravenous contrast. Multiplanar CT image reconstructions of the cervical spine and maxillofacial structures were also generated. COMPARISON:  CT scan of head of February 09, 2013. FINDINGS: CT HEAD FINDINGS Mildly depressed and comminuted frontal skull fracture is  noted. Mild bilateral frontal subarachnoid hemorrhage is noted. Mild diffuse cortical atrophy is noted. Mild chronic ischemic white matter disease is noted. Ventricular size is within normal limits. No midline shift is noted. Possible 1 cm left frontal intraparenchymal hemorrhage is noted. CT MAXILLOFACIAL FINDINGS The mandible appears normal moderately depressed left nasal bone fracture is noted. Mildly displaced left zygomatic arch fracture is noted, with mildly displaced left posterior maxillary wall fracture. Left pterygoid plate appears normal. Moderately displaced and comminuted fracture is seen involving right pterygoid plate with associated fracture of the posterior wall of the right maxillary sinus. Minimally displaced fracture is seen involving the right lamina papyracea. Moderately displaced right anterior maxillary wall fracture is noted. Hemorrhage is noted in both maxillary sinuses as well as probably in the ethmoid and sphenoid sinuses. Fractures are seen involving the lateral orbital walls bilaterally. Mildly displaced fractures are seen involving the superior orbital roofs bilaterally. Mildly displaced right orbital floor fracture is noted. Large amount of soft tissue gas is seen in the soft tissues lateral to the right maxillary sinus. CT CERVICAL SPINE FINDINGS No significant spondylolisthesis is noted. Mildly displaced fracture is seen involving the posterior spinous process of C5. Mild degenerative disc disease is noted at C3-4, C5-6 and C6-7. Visualized lung apices are unremarkable. IMPRESSION: Mild diffuse cortical atrophy. Mild chronic ischemic white matter disease. Mild bilateral frontal subarachnoid hemorrhage is noted. Probable 1 cm left frontal intraparenchymal hemorrhage is noted. No midline shift is noted. Small amount of pneumocephalus is noted. Mildly depressed and comminuted bilateral frontal skull fracture is noted. Moderately depressed left nasal bone fracture. Mildly displaced  left zygomatic arch fracture. Mildly displaced left posterior maxillary wall fracture. Moderately displaced and comminuted fracture is  seen involving the posterior wall of the right maxillary sinus and the right pterygoid plate. Hemorrhage is noted in both maxillary, ethmoid and sphenoid sinuses. Minimally displaced fracture seen involving the right lamina papyracea. Moderately displaced right anterior maxillary wall fracture is noted. Mildly displaced fractures are seen involving the superior orbital roofs bilaterally. Mildly displaced right orbital floor fracture is noted. Mildly displaced bilateral lateral orbital wall fractures. Mildly displaced fracture is seen involving posterior spinous process of C5. Multilevel degenerative disc disease is noted in the cervical spine. No significant spondylolisthesis is noted. Critical Value/emergent results were called by telephone at the time of interpretation on 07/28/2015 at 5:43 pm to Dr. Bary Castilla , who verbally acknowledged these results. Electronically Signed   By: Lupita Raider, M.D.   On: 07/28/2015 17:44      ASSESSMENT: Roosvelt Harps: AFib: rate better controlled with increased atenolol, 25 mg BID.  No anticoagulation due to recent trauma and some recent confusion.    Corky Crafts, MD  08/02/2015  11:53 AM

## 2015-08-02 NOTE — Progress Notes (Signed)
Pt more alert, did not want to eat dinner. But will drink ensure. Pt very concerned he has pneumonia. Pt has deep rattling cough.

## 2015-08-02 NOTE — Progress Notes (Addendum)
Trauma md made aware of pts condition and urinary retention. Continue to bladder scan, only in and out cath if >45700ml in bladder.   Trauma also made aware that pt has not consumed any food or liquids this entire shift.

## 2015-08-02 NOTE — Progress Notes (Signed)
Pt only took medications bc rn put them in applesauce and rn had to coax pt a lot. Would recommend crushing medications.

## 2015-08-02 NOTE — Progress Notes (Signed)
Bladder scanned patient around 0000 showing 189 on scan at highest interval, placed condom cath on patient will re-scan around 0500 he is resting comfortably, O 2 sat dropped to 81 put patient on 2 litre's will continue to monitor.

## 2015-08-02 NOTE — Progress Notes (Signed)
Patient ID: Kyle [name], male   DOB: 02/13/1927, 82 y.o.   MRN: [id number]   LOS: 5 days   Subjective: Sleepy but responds appropriately   Objective: Vital signs in last 24 hours: Temp:  [97.4 F (36.3 C)-99.2 F (37.3 C)] 97.4 F (36.3 C) (04/05 0448) Pulse Rate:  [90-122] 92 (04/05 0448) Resp:  [18-24] 24 (04/05 0448) BP: (105-142)/(55-84) 107/62 mmHg (04/05 0448) SpO2:  [82 %-97 %] 96 % (04/05 0448) Last BM Date:  (pta)   Physical Exam General appearance: no distress Resp: clear to auscultation bilaterally Cardio: regular rate and rhythm GI: normal findings: bowel sounds normal and soft, non-tender   Assessment/Plan: Fall TBI -- Stable, awaiting TBI team eval Facial fxs -- Non-operative per Dr. Emeline DarlingGore C5 SP fx -- Flex/ex negative Multiple medical problems -- Home meds except coumadin FEN -- Having trouble voiding VTE -- SCD's Dispo -- SNF when bed available    Freeman CaldronMichael J. Shakina Choy, PA-C Pager: [phone] General Trauma PA Pager: [phone]  08/02/2015

## 2015-08-02 NOTE — Progress Notes (Signed)
Pt has a bad cough, unable to clear sputum, WBC increased. md made aware.

## 2015-08-02 NOTE — Progress Notes (Addendum)
Pt will barely open his eyes to verbal stimuli and sternal rub, rn was able to get pt to say his name and where he was at. Pt falls back asleep within 5 seconds. Would barely squeeze rn hands upon repeated request. Unsure if this is pts baseline.   Pt has extensive bruising to his face and neck,  Left hand swollen.

## 2015-08-02 NOTE — Progress Notes (Signed)
Initial Nutrition Assessment   INTERVENTION:  Provide Ensure Enlive po TID after meals, each supplement provides 350 kcal and 20 grams of protein   NUTRITION DIAGNOSIS:   Inadequate oral intake related to poor appetite, acute illness as evidenced by meal completion < 25%.   GOAL:   Patient will meet greater than or equal to 90% of their needs   MONITOR:   PO intake, Supplement acceptance, Labs, Weight trends, Skin  REASON FOR ASSESSMENT:   Malnutrition Screening Tool    ASSESSMENT:   82 y.o. male patient with history of COPD and HTN presents s/p fall with posttraumatic arachnoid hemorrhage and multiple facial fractures .  Pt asleep at time of visit. No family present. Moderate muscle wasting noted in face and clavicles. Per weight history, pt has actually gained 14 lbs in the past 6 months. Per nursing notes, pt refused two meals yesterday due to not having an appetite and he ate 25% of one meal.   Labs reviewed.   Diet Order:  DIET SOFT Room service appropriate?: Yes; Fluid consistency:: Thin  Skin:  Reviewed, no issues  Last BM:  unknown  Height:   Ht Readings from Last 1 Encounters:  07/28/15 6\' 1"  (1.854 m)    Weight:   Wt Readings from Last 1 Encounters:  07/28/15 149 lb 4 oz (67.7 kg)    Ideal Body Weight:  83.6 kg  BMI:  Body mass index is 19.7 kg/(m^2).  Estimated Nutritional Needs:   Kcal:  1700-1900  Protein:  90-100 grams  Fluid:  1.7-1.9 L/day  EDUCATION NEEDS:   No education needs identified at this time  Dorothea Ogleeanne Stephanieann Popescu RD, LDN Inpatient Clinical Dietitian Pager: [phone] After Hours Pager: [phone]

## 2015-08-02 NOTE — Progress Notes (Signed)
rn went in to do in and out out. Pt did wake up and say "let me stand up", unsure what pt meant. Pt did not even flinch or move arms or legs when catheter being inserted. Pt did say "easy, easy", but did not open eyes.

## 2015-08-02 NOTE — Progress Notes (Signed)
pts son and wife at bedside, son asking if pt had been given any kind of pain medication to make pt sedated. rn checked and pt has not been giving any kind of pain medication since 4/4 at 1500. Son asking if the lethargy is due to the head trauma.   Son then asking if he could leave his mother/ patients wife with pt and he could go run an errand. Son reports wife has dementia. rn asked if wife would wander, son said "not to much".  rn stated she was not sure if wife could stay by herself with dementia.   rn talked with charge nurse and then went back in to assess the wife. rn asked the wife "what is your name and birthday?". Wife said "Is it my birthday?"   rn told son that wife was not allowed to stay alone with pt and that son would have to be present.

## 2015-08-02 NOTE — Progress Notes (Signed)
Pt talking about his wife being next to him, wife is not at bedside at this time.  Total today pt ate 3 bites of mash potatoes and half a bottle of vanilla ensure.

## 2015-08-03 LAB — URINALYSIS, ROUTINE W REFLEX MICROSCOPIC
Bilirubin Urine: NEGATIVE
Glucose, UA: NEGATIVE mg/dL
Ketones, ur: NEGATIVE mg/dL
NITRITE: NEGATIVE
Protein, ur: 30 mg/dL — AB
SPECIFIC GRAVITY, URINE: 1.018 (ref 1.005–1.030)
pH: 6 (ref 5.0–8.0)

## 2015-08-03 LAB — URINE MICROSCOPIC-ADD ON

## 2015-08-03 MED ORDER — CIPROFLOXACIN HCL 500 MG PO TABS: 500.0000 mg | ORAL_TABLET | Freq: Two times a day (BID) | ORAL | Status: DC

## 2015-08-03 MED ORDER — CIPROFLOXACIN HCL 500 MG PO TABS
500.0000 mg | ORAL_TABLET | Freq: Two times a day (BID) | ORAL | Status: DC
  Administered 2015-08-03: 500 mg via ORAL
  Filled 2015-08-03: qty 1

## 2015-08-03 NOTE — Discharge Summary (Signed)
Physician Discharge Summary  Patient ID: Kyle [name] MRN: [id number] DOB/AGE: 1926/08/03 82 y.o.  Admit date: 07/28/2015 Discharge date: 08/03/2015  Discharge Diagnoses Patient Active Problem List   Diagnosis Date Noted  . Fall 08/01/2015  . Closed fracture of facial bones (HCC) 08/01/2015  . Fracture of cervical spinous process (HCC) 08/01/2015  . TBI (traumatic brain injury) (HCC) 07/28/2015  . Closed left hip fracture (HCC) 05/18/2014  . Osteoarthritis of right hip 03/31/2012  . Weakness generalized 08/20/2010  . HTN (hypertension) 08/20/2010  . Atrial fibrillation (HCC) 07/17/2010  . Chronic anticoagulation 07/17/2010  . COPD (chronic obstructive pulmonary disease) (HCC) 07/17/2010    Consultants Dr. Cresenciano Lick for neurology  Dr. Sharlet Salina Ditty for neurosurgery  Dr. Melvenia Beam for ENT  Dr. Chilton Greathouse for critical care  Dr. Nevin Bloodgood for cardiology   Procedures None   HPI: Kyle [name] suffered a fall from level ground. He stated he tripped and struck his face. There was a possible loss of consciousness. He was evaluated by the emergency room physician and the trauma service was called. He was on chronic Coumadin for atrial fibrillation. A CT scan was done which showed a frontal bone fracture, subarachnoid hemorrhage, multiple facial fractures, a transverse process of C5, and significant bruising of the face. There was some question of a facial droop and neurology was consulted for possible stroke. Critical care and cardiology were also consulted. He underwent anticoagulation reversal. Neurosurgery and ENT were consulted for his injuries and he was admitted to the trauma service.   Hospital Course: Neurology did not think the patient had suffered a stroke. Neurosurgery recommended non-operative treatment and his mental status remained stable in the early part of his hospital course. ENT also recommended non-operative treatment. He was maintained off of coumadin and his atrial  fibrillation was treated with rate control medications. He was evaluated by the traumatic brain injury therapy team who recommended skilled nursing facility placement. He improved during the middle of his hospital stay but then became lethargic again. Workup showed a likely UTI and he was started on empiric ciprofloxacin. He improved again and was felt stable for placement. He was discharged to the skilled nursing facility in good condition.     Medication List    STOP taking these medications        HYDROcodone-acetaminophen 5-325 MG tablet  Commonly known as:  NORCO/VICODIN     sulfamethoxazole-trimethoprim 800-160 MG tablet  Commonly known as:  BACTRIM DS,SEPTRA DS     warfarin 5 MG tablet  Commonly known as:  COUMADIN      TAKE these medications        alfuzosin 10 MG 24 hr tablet  Commonly known as:  UROXATRAL  Take 10 mg by mouth 2 (two) times daily.     atenolol 50 MG tablet  Commonly known as:  TENORMIN  Take 0.5 tablets (25 mg total) by mouth daily.     atorvastatin 10 MG tablet  Commonly known as:  LIPITOR  Take 10 mg by mouth daily.     ciprofloxacin 500 MG tablet  Commonly known as:  CIPRO  Take 1 tablet (500 mg total) by mouth 2 (two) times daily.     digoxin 0.125 MG tablet  Commonly known as:  LANOXIN  Take 125 mcg by mouth daily.     feeding supplement (ENSURE COMPLETE) Liqd  Take 237 mLs by mouth 2 (two) times daily between meals.     tiotropium 18 MCG inhalation capsule  Commonly  known as:  SPIRIVA  Place 18 mcg into inhaler and inhale daily.     triamterene-hydrochlorothiazide 37.5-25 MG tablet  Commonly known as:  MAXZIDE-25  Take 0.5 tablets by mouth daily.            Follow-up Information    Follow up with [facility]. Go on 08/25/2015.   Specialty:  Cardiology   Why:  @9 :00 am for hospital follow up with Darrol Jumphonda G Barrett, PA-C   Contact information:   [address] [place]  [id number] [phone]      Call [facility].   Why:  As needed   Contact information:   [address] [id number] mc [place] [id number] [phone]       Signed: Freeman CaldronMichael J. Maddison Kilner, PA-C Pager: [phone] General Trauma PA Pager: [phone] 08/03/2015, 1:22 PM

## 2015-08-03 NOTE — Progress Notes (Signed)
Physical Therapy Treatment Patient Details Name: Kyle [name] MRN: [id number] DOB: January 23, 1927 Today's Date: 08/03/2015    History of Present Illness Patient is an 82 y.o. male with PMH of fall with L femur fx 1/16, R RCT, COPD, HTN, hyperlipidemia, Rt shoulder arthroplasty, SOB, OA, PAF, afib, THA who presented s/p fall at home.  CT scan was done which shows a frontal bone fracture, subarachnoid hemorrhage, multiple facial fractures, transverse process of C5, and significant bruising of the face.     PT Comments    Pt confused today and requires MOD to MAX of 2 with mobility.  Needs A for anterior weight shift to get COG over BOS.  Difficulty moving R LE with SPT.  Con't to recommend SNF.  Follow Up Recommendations  SNF     Equipment Recommendations  None recommended by PT    Recommendations for Other Services       Precautions / Restrictions Precautions Precautions: Fall Restrictions Weight Bearing Restrictions: No    Mobility  Bed Mobility Overal bed mobility: Needs Assistance Bed Mobility: Supine to Sit     Supine to sit: Min assist;+2 for physical assistance     General bed mobility comments: Multi modal cueing to get to EOB  Transfers Overall transfer level: Needs assistance Equipment used: Rolling walker (2 wheeled) Transfers: Sit to/from [employer] Sit to Stand: Mod assist;+2 physical assistance Stand pivot transfers: Max assist;+2 physical assistance       General transfer comment: MAX A to get COG over BOS. Difficulty moving R LE with progress with SPT and required MAX A of 2  Ambulation/Gait                 Stairs            Wheelchair Mobility    Modified Rankin (Stroke Patients Only)       Balance Overall balance assessment: Needs assistance Sitting-balance support: Feet supported Sitting balance-Leahy Scale: Poor Sitting balance - Comments: Required UE support to maintain upright posture.    Standing balance  support: Bilateral upper extremity supported Standing balance-Leahy Scale: Poor                      Cognition Arousal/Alertness: Awake/alert Behavior During Therapy: WFL for tasks assessed/performed Overall Cognitive Status: No family/caregiver present to determine baseline cognitive functioning   Orientation Level: Disoriented to;Place;Situation     Following Commands: Follows one step commands with increased time     Problem Solving: Slow processing;Requires verbal cues      Exercises      General Comments        Pertinent Vitals/Pain Pain Assessment: Faces Faces Pain Scale: Hurts even more Pain Location: R shoulder Pain Descriptors / Indicators: Grimacing Pain Intervention(s): Limited activity within patient's tolerance;Monitored during session    Home Living                      Prior Function            PT Goals (current goals can now be found in the care plan section) Acute Rehab PT Goals PT Goal Formulation: With patient Time For Goal Achievement: 08-12-2015 Potential to Achieve Goals: Fair Progress towards PT goals: Not progressing toward goals - comment (fearful)    Frequency  Min 3X/week    PT Plan Current plan remains appropriate    Co-evaluation             End of Session Equipment Utilized  During Treatment: Gait belt Activity Tolerance: Patient limited by pain Patient left: in chair;with call bell/phone within reach;with chair alarm set;Other (comment) (with lunch tray)     Time: 6010-93231309-1324 PT Time Calculation (min) (ACUTE ONLY): 15 min  Charges:  $Therapeutic Activity: 8-22 mins                    G Codes:      Jarryd Gratz LUBECK 08/03/2015, 1:44 PM

## 2015-08-03 NOTE — Progress Notes (Addendum)
Patient Name: Kyle [name] Date of Encounter: 08/03/2015   SUBJECTIVE  No chest pain or sob. Family at bedside.   CURRENT MEDS . alfuzosin  10 mg Oral BID  . antiseptic oral rinse  7 mL Mouth Rinse q12n4p  . atenolol  25 mg Oral BID  . atorvastatin  10 mg Oral Daily  . chlorhexidine  15 mL Mouth Rinse BID  . feeding supplement (ENSURE ENLIVE)  237 mL Oral TID PC  . levETIRAcetam  500 mg Oral BID  . neomycin-bacitracin-polymyxin   Topical TID  . tiotropium  18 mcg Inhalation Daily    OBJECTIVE  Filed Vitals:   08/02/15 2219 08/03/15 0157 08/03/15 0609 08/03/15 0919  BP: 120/66 118/74 113/83 106/60  Pulse: 112 113 109 99  Temp: 97.7 F (36.5 C) 99.1 F (37.3 C) 98.9 F (37.2 C) 97.8 F (36.6 C)  TempSrc: Axillary Axillary Axillary Oral  Resp: Height:      Weight:      SpO2: 95% 95% 99% 95%    Intake/Output Summary (Last 24 hours) at 08/03/15 1114 Last data filed at 08/03/15 0920  Gross per 24 hour  Intake    480 ml  Output   1622 ml  Net  -1142 ml   Filed Weights   07/28/15 1700 07/28/15 1718  Weight: 149 lb 4 oz (67.7 kg) 149 lb 4 oz (67.7 kg)    PHYSICAL EXAM  General: Pleasant, NAD. Neuro: Alert and oriented X 3. Moves all extremities spontaneously. Psych: Normal affect. HEENT:  Multiple bruise on face  Neck: Supple without bruits or JVD. Lungs:  Resp regular and unlabored, CTA. Heart: Ir Ir  no s3, s4, or murmurs. Abdomen: Soft, non-tender, non-distended, BS + x 4.  Extremities: No clubbing, cyanosis or edema. DP/PT/Radials 2+ and equal bilaterally.  Accessory Clinical Findings  CBC  Recent Labs  08/02/15 1433  WBC 14.8*  NEUTROABS 10.6*  HGB 10.8*  HCT 32.3*  MCV 92.8  PLT 246   Basic Metabolic Panel  Recent Labs  08/02/15 1433  NA 142  K 3.4*  CL 106  CO2 27  GLUCOSE 109*  BUN 28*  CREATININE 1.02  CALCIUM 8.8*   Liver Function Tests No results for input(s): AST, ALT, ALKPHOS, BILITOT, PROT, ALBUMIN in the  last 72 hours. No results for input(s): LIPASE, AMYLASE in the last 72 hours. Cardiac Enzymes No results for input(s): CKTOTAL, CKMB, CKMBINDEX, TROPONINI in the last 72 hours. BNP Invalid input(s): POCBNP D-Dimer No results for input(s): DDIMER in the last 72 hours. Hemoglobin A1C No results for input(s): HGBA1C in the last 72 hours. Fasting Lipid Panel No results for input(s): CHOL, HDL, LDLCALC, TRIG, CHOLHDL, LDLDIRECT in the last 72 hours. Thyroid Function Tests No results for input(s): TSH, T4TOTAL, T3FREE, THYROIDAB in the last 72 hours.  Invalid input(s): FREET3  TELE  afib at rate of 90-100s, at times in 130s-150s  Radiology/Studies  ASSESSMENT AND PLAN   1. Afib: rate is stable at current dose of atenolol  BID. Unable to titrate further due to borderline low BP. Continue current dose. No anticoagulation at this time due to multiple trauma.   Will sign off. F/u in clinic 3-4 weeks. Appointment has been made.   Signed, Bhagat,Bhavinkumar PA-C Pager [phone]  I have examined the patient and reviewed assessment and plan and discussed with patient.  Agree with above as stated.  More alert today.  Continue atenolol.  Not planning longterm anticoagulation.  Cardiology F/u with Dr. Jens Somrenshaw.  Cerita Rabelo S.

## 2015-08-03 NOTE — Clinical Social Work Placement (Signed)
   CLINICAL SOCIAL WORK PLACEMENT  NOTE  Date:  08/03/2015  Patient Details  Name: [name] MRN: [id number] Date of Birth: 05/25/1926  Clinical Social Work is seeking post-discharge placement for this patient at the Skilled  Nursing Facility level of care (*CSW will initial, date and re-position this form in  chart as items are completed):  Yes   Patient/family provided with [facility] Clinical Social Work Department's list of facilities offering this level of care within the geographic area requested by the patient (or if unable, by the patient's family).  Yes   Patient/family informed of their freedom to choose among providers that offer the needed level of care, that participate in Medicare, Medicaid or managed care program needed by the patient, have an available bed and are willing to accept the patient.  Yes   Patient/family informed of [facility]'s ownership interest in [facility] and [facility], as well as of the fact that they are under no obligation to receive care at these facilities.  PASRR submitted to EDS on       PASRR number received on       Existing PASRR number confirmed on 08/01/15     FL2 transmitted to all facilities in geographic area requested by pt/family on 08/01/15     FL2 transmitted to all facilities within larger geographic area on       Patient informed that his/her managed care company has contracts with or will negotiate with certain facilities, including the following:        Yes   Patient/family informed of bed offers received.  Patient chooses bed at [facility] [place]     Physician recommends and patient chooses bed at      Patient to be transferred to [facility] [place] [facility] on 08/03/15.  Patient to be transferred to facility by Ambulance     Patient family notified on 08/03/15 of transfer.  Name of family member notified:  Patient son Chrissie Noa(William) over the phone     PHYSICIAN Please prepare priority discharge summary,  including medications, Please prepare prescriptions, Please sign FL2     Additional Comment:    Macario GoldsJesse Kymere Fullington, LCSW [phone]

## 2015-08-03 NOTE — Care Management Note (Signed)
Case Management Note  Patient Details  Name: Aletta EdouardMyrl Stretch MRN: [id number] Date of Birth: 02/20/1927  Subjective/Objective:   Pt medically stable for dc today.                   Action/Plan: Plan dc to SNF today, per CSW arrangements.    Expected Discharge Date:    08/03/2015              Expected Discharge Plan:  Skilled Nursing Facility  In-House Referral:  Clinical Social Work  Discharge planning Services  CM Consult  Post Acute Care Choice:    Choice offered to:     DME Arranged:    DME Agency:     HH Arranged:    HH Agency:     Status of Service:  Completed, signed off  Medicare Important Message Given:  Yes Date Medicare IM Given:    Medicare IM give by:    Date Additional Medicare IM Given:    Additional Medicare Important Message give by:     If discussed at Long Length of Stay Meetings, dates discussed:    Additional Comments:  Quintella BatonJulie W. Howard Bunte, RN, BSN  Trauma/Neuro ICU Case Manager [phone]

## 2015-08-03 NOTE — Progress Notes (Signed)
Patient ID: Kyle [name], male   DOB: 10/26/1926, 82 y.o.   MRN: [id number]   LOS: 6 days   Subjective: More alert this morning, no c/o.   Objective: Vital signs in last 24 hours: Temp:  [97.5 F (36.4 C)-99.1 F (37.3 C)] 98.9 F (37.2 C) (04/06 0609) Pulse Rate:  [86-115] 109 (04/06 0609) Resp:  [20-22] 20 (04/06 0609) BP: (113-134)/(66-83) 113/83 mmHg (04/06 0609) SpO2:  [95 %-99 %] 99 % (04/06 0609) Last BM Date:  (unsure)   Laboratory  CBC  Recent Labs  08/02/15 1433  WBC 14.8*  HGB 10.8*  HCT 32.3*  PLT 246   BMET  Recent Labs  08/02/15 1433  NA 142  K 3.4*  CL 106  CO2 27  GLUCOSE 109*  BUN 28*  CREATININE 1.02  CALCIUM 8.8*    Physical Exam General appearance: alert and no distress Resp: clear to auscultation bilaterally Cardio: regular rate and rhythm GI: normal findings: bowel sounds normal and soft, non-tender   Assessment/Plan: Fall TBI -- Stable Facial fxs -- Non-operative per Dr. Emeline DarlingGore C5 SP fx -- Flex/ex negative Multiple medical problems -- Home meds except coumadin FEN -- Having trouble voiding still, suspect UTI with leukocytosis VTE -- SCD's Dispo -- SNF likely today    Freeman CaldronMichael J. Moneisha Vosler, PA-C Pager: [phone] General Trauma PA Pager: [phone]  08/03/2015

## 2015-08-03 NOTE — Clinical Social Work Note (Signed)
Clinical Social Worker facilitated patient discharge including contacting patient family and facility to confirm patient discharge plans.  Clinical information faxed to facility and family agreeable with plan.  CSW arranged ambulance transport via [facility] to [facility].  RN to call report prior to discharge.  Clinical Social Worker will sign off for now as social work intervention is no longer needed. Please consult us again if new need arises.  Jesse Ethel Meisenheimer, LCSW [phone] 

## 2015-08-03 NOTE — Care Management Important Message (Signed)
Important Message  Patient Details  Name: Kyle [name] MRN: [id number] Date of Birth: 06/15/1926   Medicare Important Message Given:  Yes    Katira Dumais P Bret Vanessen 08/03/2015, 12:46 PM

## 2015-08-03 NOTE — Progress Notes (Signed)
Speech Language Pathology Treatment: Cognitive-Linquistic  Patient Details Name: Kyle EdouardMyrl [name] MRN: [id number] DOB: 06/26/1926 Today's Date: 08/03/2015 Time: [id number] SLP Time Calculation (min) (ACUTE ONLY): 16 min  Assessment / Plan / Recommendation Clinical Impression  Pt seen for skilled cognitive-linguistic therapy with focus on orientation, recall and language. Pt able to follow 1 and 2 step directives with repetition for auditory comprehension [facility] Ox self, April 2017, hospital and "I fell and busted my head". Pt able to demosntrate recall of long term memory events which appeared [facility]. However, immediate and recent recall was limited. Pt was quite tangential and required frequent redirection. Intermittent temporal disorientation noted with recent events. Pt reports vision is improving although he was unable to read at this time. Gains noted since previous session. Would benefit from followup services at SNF level.   HPI HPI: Patient is an 82 y.o. male with PMH of fall with L femur fx 1/16, R RCT, COPD, HTN, hyperlipidemia, Rt shoulder arthroplasty, SOB, OA, PAF, afib, THA who presented s/p fall at home. CT scan was done which shows a frontal bone fracture, subarachnoid hemorrhage, multiple facial fractures, transverse process of C5, and significant bruising of the face.      SLP Plan        Recommendations                Follow up Recommendations: Skilled Nursing facility;24 hour supervision/assistance     GO                Rocky CraftsKara E Mervil Wacker MA, CCC-SLP 08/03/2015, 4:21 PM

## 2015-08-06 LAB — URINE CULTURE

## 2015-08-08 ENCOUNTER — Emergency Department (HOSPITAL_COMMUNITY): Payer: Medicare Other

## 2015-08-08 ENCOUNTER — Inpatient Hospital Stay (HOSPITAL_COMMUNITY)
Admission: EM | Admit: 2015-08-08 | Discharge: 2015-08-28 | DRG: 064 | Disposition: E | Payer: Medicare Other | Attending: Internal Medicine | Admitting: Internal Medicine

## 2015-08-08 ENCOUNTER — Encounter (HOSPITAL_COMMUNITY): Payer: Self-pay

## 2015-08-08 DIAGNOSIS — Z09 Encounter for follow-up examination after completed treatment for conditions other than malignant neoplasm: Secondary | ICD-10-CM

## 2015-08-08 DIAGNOSIS — I10 Essential (primary) hypertension: Secondary | ICD-10-CM | POA: Diagnosis present

## 2015-08-08 DIAGNOSIS — E876 Hypokalemia: Secondary | ICD-10-CM | POA: Diagnosis not present

## 2015-08-08 DIAGNOSIS — J690 Pneumonitis due to inhalation of food and vomit: Secondary | ICD-10-CM | POA: Diagnosis present

## 2015-08-08 DIAGNOSIS — R063 Periodic breathing: Secondary | ICD-10-CM | POA: Diagnosis present

## 2015-08-08 DIAGNOSIS — R4182 Altered mental status, unspecified: Secondary | ICD-10-CM | POA: Diagnosis present

## 2015-08-08 DIAGNOSIS — Z79899 Other long term (current) drug therapy: Secondary | ICD-10-CM | POA: Diagnosis not present

## 2015-08-08 DIAGNOSIS — L8991 Pressure ulcer of unspecified site, stage 1: Secondary | ICD-10-CM | POA: Diagnosis present

## 2015-08-08 DIAGNOSIS — Z66 Do not resuscitate: Secondary | ICD-10-CM | POA: Insufficient documentation

## 2015-08-08 DIAGNOSIS — J80 Acute respiratory distress syndrome: Secondary | ICD-10-CM | POA: Diagnosis not present

## 2015-08-08 DIAGNOSIS — I609 Nontraumatic subarachnoid hemorrhage, unspecified: Secondary | ICD-10-CM

## 2015-08-08 DIAGNOSIS — E870 Hyperosmolality and hypernatremia: Secondary | ICD-10-CM | POA: Diagnosis present

## 2015-08-08 DIAGNOSIS — S0993XA Unspecified injury of face, initial encounter: Secondary | ICD-10-CM | POA: Diagnosis not present

## 2015-08-08 DIAGNOSIS — J449 Chronic obstructive pulmonary disease, unspecified: Secondary | ICD-10-CM | POA: Diagnosis present

## 2015-08-08 DIAGNOSIS — I6200 Nontraumatic subdural hemorrhage, unspecified: Secondary | ICD-10-CM | POA: Diagnosis present

## 2015-08-08 DIAGNOSIS — R400 Somnolence: Secondary | ICD-10-CM | POA: Diagnosis not present

## 2015-08-08 DIAGNOSIS — R410 Disorientation, unspecified: Secondary | ICD-10-CM | POA: Insufficient documentation

## 2015-08-08 DIAGNOSIS — Z515 Encounter for palliative care: Secondary | ICD-10-CM | POA: Diagnosis not present

## 2015-08-08 DIAGNOSIS — G92 Toxic encephalopathy: Secondary | ICD-10-CM | POA: Diagnosis present

## 2015-08-08 DIAGNOSIS — I4891 Unspecified atrial fibrillation: Secondary | ICD-10-CM | POA: Diagnosis present

## 2015-08-08 DIAGNOSIS — R531 Weakness: Secondary | ICD-10-CM

## 2015-08-08 DIAGNOSIS — F17290 Nicotine dependence, other tobacco product, uncomplicated: Secondary | ICD-10-CM | POA: Diagnosis present

## 2015-08-08 DIAGNOSIS — R0602 Shortness of breath: Secondary | ICD-10-CM

## 2015-08-08 DIAGNOSIS — Z681 Body mass index (BMI) 19 or less, adult: Secondary | ICD-10-CM | POA: Diagnosis not present

## 2015-08-08 DIAGNOSIS — Z7189 Other specified counseling: Secondary | ICD-10-CM | POA: Diagnosis not present

## 2015-08-08 DIAGNOSIS — E43 Unspecified severe protein-calorie malnutrition: Secondary | ICD-10-CM | POA: Diagnosis present

## 2015-08-08 DIAGNOSIS — E785 Hyperlipidemia, unspecified: Secondary | ICD-10-CM | POA: Diagnosis present

## 2015-08-08 DIAGNOSIS — S065XAA Traumatic subdural hemorrhage with loss of consciousness status unknown, initial encounter: Secondary | ICD-10-CM

## 2015-08-08 DIAGNOSIS — D72829 Elevated white blood cell count, unspecified: Secondary | ICD-10-CM | POA: Diagnosis present

## 2015-08-08 DIAGNOSIS — J9601 Acute respiratory failure with hypoxia: Secondary | ICD-10-CM | POA: Diagnosis present

## 2015-08-08 DIAGNOSIS — I480 Paroxysmal atrial fibrillation: Secondary | ICD-10-CM | POA: Diagnosis present

## 2015-08-08 DIAGNOSIS — R0600 Dyspnea, unspecified: Secondary | ICD-10-CM | POA: Diagnosis not present

## 2015-08-08 DIAGNOSIS — Z96643 Presence of artificial hip joint, bilateral: Secondary | ICD-10-CM | POA: Diagnosis present

## 2015-08-08 DIAGNOSIS — L8990 Pressure ulcer of unspecified site, unspecified stage: Secondary | ICD-10-CM | POA: Insufficient documentation

## 2015-08-08 DIAGNOSIS — N179 Acute kidney failure, unspecified: Secondary | ICD-10-CM | POA: Diagnosis present

## 2015-08-08 DIAGNOSIS — S065X9A Traumatic subdural hemorrhage with loss of consciousness of unspecified duration, initial encounter: Secondary | ICD-10-CM

## 2015-08-08 DIAGNOSIS — S0993XD Unspecified injury of face, subsequent encounter: Secondary | ICD-10-CM

## 2015-08-08 DIAGNOSIS — R0902 Hypoxemia: Secondary | ICD-10-CM

## 2015-08-08 HISTORY — DX: Unspecified asthma, uncomplicated: J45.909

## 2015-08-08 LAB — URINALYSIS, ROUTINE W REFLEX MICROSCOPIC
Bilirubin Urine: NEGATIVE
GLUCOSE, UA: NEGATIVE mg/dL
Ketones, ur: 15 mg/dL — AB
Nitrite: NEGATIVE
PROTEIN: 30 mg/dL — AB
Specific Gravity, Urine: 1.021 (ref 1.005–1.030)
pH: 6 (ref 5.0–8.0)

## 2015-08-08 LAB — COMPREHENSIVE METABOLIC PANEL
ALBUMIN: 2.8 g/dL — AB (ref 3.5–5.0)
ALK PHOS: 80 U/L (ref 38–126)
ALT: 46 U/L (ref 17–63)
AST: 35 U/L (ref 15–41)
Anion gap: 14 (ref 5–15)
BILIRUBIN TOTAL: 1.5 mg/dL — AB (ref 0.3–1.2)
BUN: 43 mg/dL — AB (ref 6–20)
CALCIUM: 9 mg/dL (ref 8.9–10.3)
CO2: 25 mmol/L (ref 22–32)
Chloride: 107 mmol/L (ref 101–111)
Creatinine, Ser: 1.45 mg/dL — ABNORMAL HIGH (ref 0.61–1.24)
GFR calc Af Amer: 49 mL/min — ABNORMAL LOW (ref >60)
GFR calc non Af Amer: 42 mL/min — ABNORMAL LOW (ref >60)
GLUCOSE: 117 mg/dL — AB (ref 65–99)
Potassium: 4.1 mmol/L (ref 3.5–5.1)
Sodium: 146 mmol/L — ABNORMAL HIGH (ref 135–145)
TOTAL PROTEIN: 7 g/dL (ref 6.5–8.1)

## 2015-08-08 LAB — PROTIME-INR
INR: 1.39 (ref 0.00–1.49)
PROTHROMBIN TIME: 17.2 s — AB (ref 11.6–15.2)

## 2015-08-08 LAB — I-STAT ARTERIAL BLOOD GAS, ED
Acid-Base Excess: 1 mmol/L (ref 0.0–2.0)
BICARBONATE: 26.2 meq/L — AB (ref 20.0–24.0)
O2 Saturation: 93 %
PCO2 ART: 41.9 mmHg (ref 35.0–45.0)
PO2 ART: 66 mmHg — AB (ref 80.0–100.0)
TCO2: 27 mmol/L (ref 0–100)
pH, Arterial: 7.405 (ref 7.350–7.450)

## 2015-08-08 LAB — DIFFERENTIAL
BASOS ABS: 0 10*3/uL (ref 0.0–0.1)
Basophils Relative: 0 %
Eosinophils Absolute: 0.1 10*3/uL (ref 0.0–0.7)
Eosinophils Relative: 0 %
LYMPHS ABS: 3.2 10*3/uL (ref 0.7–4.0)
LYMPHS PCT: 19 %
Monocytes Absolute: 0.8 10*3/uL (ref 0.1–1.0)
Monocytes Relative: 5 %
NEUTROS ABS: 13.2 10*3/uL — AB (ref 1.7–7.7)
Neutrophils Relative %: 76 %

## 2015-08-08 LAB — CBC
HEMATOCRIT: 38.4 % — AB (ref 39.0–52.0)
Hemoglobin: 12.3 g/dL — ABNORMAL LOW (ref 13.0–17.0)
MCH: 29.9 pg (ref 26.0–34.0)
MCHC: 32 g/dL (ref 30.0–36.0)
MCV: 93.2 fL (ref 78.0–100.0)
PLATELETS: 407 10*3/uL — AB (ref 150–400)
RBC: 4.12 MIL/uL — AB (ref 4.22–5.81)
RDW: 14.5 % (ref 11.5–15.5)
WBC: 17.3 10*3/uL — AB (ref 4.0–10.5)

## 2015-08-08 LAB — URINE MICROSCOPIC-ADD ON

## 2015-08-08 LAB — I-STAT CG4 LACTIC ACID, ED
LACTIC ACID, VENOUS: 2.01 mmol/L — AB (ref 0.5–2.0)
Lactic Acid, Venous: 1.95 mmol/L (ref 0.5–2.0)

## 2015-08-08 LAB — APTT: APTT: 28 s (ref 24–37)

## 2015-08-08 LAB — DIGOXIN LEVEL: DIGOXIN LVL: 0.6 ng/mL — AB (ref 0.8–2.0)

## 2015-08-08 MED ORDER — PIPERACILLIN-TAZOBACTAM 3.375 G IVPB
3.3750 g | Freq: Three times a day (TID) | INTRAVENOUS | Status: DC
  Administered 2015-08-09 – 2015-08-12 (×12): 3.375 g via INTRAVENOUS
  Filled 2015-08-08 (×14): qty 50

## 2015-08-08 MED ORDER — DEXTROSE-NACL 5-0.2 % IV SOLN
INTRAVENOUS | Status: DC
  Administered 2015-08-09: 02:00:00 via INTRAVENOUS

## 2015-08-08 MED ORDER — ACETAMINOPHEN 325 MG PO TABS
650.0000 mg | ORAL_TABLET | Freq: Four times a day (QID) | ORAL | Status: DC | PRN
Start: 2015-08-08 — End: 2015-08-13

## 2015-08-08 MED ORDER — ONDANSETRON HCL 4 MG PO TABS: 4.0000 mg | ORAL_TABLET | Freq: Four times a day (QID) | ORAL | Status: DC | PRN

## 2015-08-08 MED ORDER — DILTIAZEM HCL 100 MG IV SOLR
INTRAVENOUS | Status: AC
  Filled 2015-08-08: qty 100

## 2015-08-08 MED ORDER — ACETAMINOPHEN 650 MG RE SUPP: 650.0000 mg | Freq: Four times a day (QID) | RECTAL | Status: DC | PRN

## 2015-08-08 MED ORDER — VANCOMYCIN HCL 10 G IV SOLR
1250.0000 mg | INTRAVENOUS | Status: AC
  Administered 2015-08-08: 1250 mg via INTRAVENOUS
  Filled 2015-08-08: qty 1250

## 2015-08-08 MED ORDER — SODIUM CHLORIDE 0.9 % IV BOLUS (SEPSIS)
500.0000 mL | Freq: Once | INTRAVENOUS | Status: AC
  Administered 2015-08-08: 500 mL via INTRAVENOUS

## 2015-08-08 MED ORDER — VANCOMYCIN HCL IN DEXTROSE 1-5 GM/200ML-% IV SOLN
1000.0000 mg | INTRAVENOUS | Status: DC
  Administered 2015-08-09 – 2015-08-10 (×2): 1000 mg via INTRAVENOUS
  Filled 2015-08-08 (×3): qty 200

## 2015-08-08 MED ORDER — PIPERACILLIN-TAZOBACTAM 3.375 G IVPB 30 MIN
3.3750 g | Freq: Once | INTRAVENOUS | Status: AC
  Administered 2015-08-08: 3.375 g via INTRAVENOUS
  Filled 2015-08-08: qty 50

## 2015-08-08 MED ORDER — DILTIAZEM LOAD VIA INFUSION
10.0000 mg | Freq: Once | INTRAVENOUS | Status: AC
  Administered 2015-08-08: 10 mg via INTRAVENOUS

## 2015-08-08 MED ORDER — CETYLPYRIDINIUM CHLORIDE 0.05 % MT LIQD
7.0000 mL | Freq: Two times a day (BID) | OROMUCOSAL | Status: DC
  Administered 2015-08-09 – 2015-08-12 (×8): 7 mL via OROMUCOSAL

## 2015-08-08 MED ORDER — ONDANSETRON HCL 4 MG/2ML IJ SOLN: 4.0000 mg | Freq: Four times a day (QID) | INTRAMUSCULAR | Status: DC | PRN

## 2015-08-08 MED ORDER — DEXTROSE 5 % IV SOLN
5.0000 mg/h | INTRAVENOUS | Status: DC
  Administered 2015-08-08: 5 mg/h via INTRAVENOUS
  Administered 2015-08-10: 10 mg/h via INTRAVENOUS
  Administered 2015-08-10 (×2): 7.5 mg/h via INTRAVENOUS
  Administered 2015-08-11 (×2): 10 mg/h via INTRAVENOUS
  Administered 2015-08-11: 7.5 mg/h via INTRAVENOUS
  Administered 2015-08-12: 5 mg/h via INTRAVENOUS
  Filled 2015-08-08 (×7): qty 100

## 2015-08-08 NOTE — ED Notes (Signed)
RT at the bedside.

## 2015-08-08 NOTE — H&P (Signed)
[facility] History and Physical  Kyle EdouardMyrl [name] WUJ:[id number] DOB: 11/05/1926 DOA: August 23, 2015  Referring physician: Dr Linwood DibblesKnapp, Kyle PCP: Kyle KayserPERINI,Kyle A, MD   Chief Complaint: AMS in SNF patient w recent fall/ facial trauma/ mild SAH  HPI: Kyle [name] is [name] 82 y.o. male with hx of HTN, afib, COPD.  He was here 2 weeks ago for 7d hosp stay after mechanical fall with multiple facial fractures R face and L frontal lobe SAH w some IV bleeding. There was no shift and MS was stable, as below NSurg rec'd conserviative management and pt was dc'd to SNF ([facility]).  Yesterday family was visitng and noticed some decrease in MS.  Today per SNF staff MS was worse again so sent to ED.  Here pt is very weak, whispering, disoriented.  Creat / Na up slightly, has rhonchorous cough/ congestion.  CXR not available due to IT issues. WBC up 17k.  Asked to see for IP admission.   Patient not resopnding well to questions.  Denies CP.      Chart review: 3/31- 08/03/15 > fall, struck his face > fractures of frontal bone/ SA hemorrhage L ant frontal lobe, multiple facial fractures, fx of C5 transverse process and significant facial bruising. Neuro saw for possible CVA because of some facial droop.  Underwent anticoag reversal.  Neuro felt he did not have [name] stroke.  NSurg recommended conservative Rx, and his MS remained stable.  ENT recommended non-surg Rx.  Coumadin held and afib rx with rate control meds.  Seen by TBI team and they recommended SNF.  MS declined and he was dx'd with UTI rx'd with Cipro.  Jan 2016 - closed L hip periprosthetic fracture  , hx prior bilat THR, minimal dispalcement > ortho rec'd no surgery, PT and watch, should take 6-8 wks to heal.  Dec 2013- R THR 2011 - comm -=acquired PNA, COPD exac, afib new diagnosis, hx HTN, HL, DJD, impair fast glucose   ROS  denies CP  no joint pain   no HA  no blurry vision  no rash  no diarrhea  no nausea/ vomiting  no dysuria  no difficulty voiding  no  change in urine color    Where does patient live SNF now Can patient participate in ADLs? He was before this  Past Medical History  Past Medical History  Diagnosis Date  . PAF (paroxysmal atrial fibrillation) (HCC)     Managed with rate control and coumadin  . COPD (chronic obstructive pulmonary disease) (HCC)   . HTN (hypertension)   . Hyperlipemia   . OA (osteoarthritis)   . Chronic anticoagulation   . Incomplete rotator cuff tear or rupture of right shoulder, not specified as traumatic 2012  . Chronic anticoagulation   . Nocturia   . Osteoarthritis of right hip 03/31/2012  . Shortness of breath    Past Surgical History  Past Surgical History  Procedure Laterality Date  . Total hip arthroplasty  1998  . Shoulder arthroscopy  1994    RIGHT  . Cholecystectomy    . Appendectomy    . Cataract extraction w/ intraocular lens  implant, bilateral    . Right knee arthroscopy    . Total hip arthroplasty  03/31/2012    RIGHT HIP  . Total hip arthroplasty  03/31/2012    Procedure: TOTAL HIP ARTHROPLASTY;  Surgeon: Eulas PostJoshua P Landau, MD;  Location: [facility] OR;  Service: Orthopedics;  Laterality: Right;   Family History  Family History  Problem Relation Age of  Onset  . Cancer Father   . Cancer Mother   . Other Brother     SUICIDE   Social History  reports that he has been smoking Pipe.  He has never used smokeless tobacco. He reports that he does not drink alcohol or use illicit drugs. Allergies  Allergies  Allergen Reactions  . Advair Diskus [Fluticasone-Salmeterol] Other (See Comments)    Unknown reaction per son  . Dilaudid [Hydromorphone Hcl] Other (See Comments)    Makes patient very loopy and disoriented  . Morphine And Related Other (See Comments)    Loopy and disoriented   Home medications Prior to Admission medications   Medication Sig Start Date End Date Taking? Authorizing Provider  alfuzosin (UROXATRAL) 10 MG 24 hr tablet Take 10 mg by mouth 2 (two) times daily.  05/16/15  Yes Historical Provider, MD  atorvastatin (LIPITOR) 10 MG tablet Take 10 mg by mouth daily.     Yes Historical Provider, MD  CefTRIAXone Sodium (ROCEPHIN IJ) Inject 1 g as directed once.   Yes Historical Provider, MD  ciprofloxacin (CIPRO) 500 MG tablet Take 1 tablet (500 mg total) by mouth 2 (two) times daily. 08/03/15  Yes Kyle Caldron, PA-C  digoxin (LANOXIN) 0.125 MG tablet Take 125 mcg by mouth daily. 06/13/15  Yes Historical Provider, MD  feeding supplement, ENSURE COMPLETE, (ENSURE COMPLETE) LIQD Take 237 mLs by mouth 2 (two) times daily between meals. Patient taking differently: Take 237 mLs by mouth daily.  05/20/14  Yes Leroy Sea, MD  piperacillin-tazobactam (ZOSYN) 3.375 GM/50ML IVPB Inject 3.375 g into the vein every 8 (eight) hours.   Yes Historical Provider, MD  Sennosides (SENEXON PO) Take 1 tablet by mouth daily as needed (constipation).   Yes Historical Provider, MD  tiotropium (SPIRIVA) 18 MCG inhalation capsule Place 18 mcg into inhaler and inhale daily.   Yes Historical Provider, MD  triamterene-hydrochlorothiazide (MAXZIDE-25) 37.5-25 MG per tablet Take 0.5 tablets by mouth daily. 12/19/14  Yes Historical Provider, MD   Liver Function Tests  Recent Labs Lab 08/01/2015 1625  AST 35  ALT 46  ALKPHOS 80  BILITOT 1.5*  PROT 7.0  ALBUMIN 2.8*   No results for input(s): LIPASE, AMYLASE in the last 168 hours. CBC  Recent Labs Lab 08/02/15 1433 08/11/2015 1625  WBC 14.8* 17.3*  NEUTROABS 10.6* 13.2*  HGB 10.8* 12.3*  HCT 32.3* 38.4*  MCV 92.8 93.2  PLT 246 407*   Basic Metabolic Panel  Recent Labs Lab 08/02/15 1433 08/25/2015 1625  NA 142 146*  K 3.4* 4.1  CL 106 107  CO2 27 25  GLUCOSE 109* 117*  BUN 28* 43*  CREATININE 1.02 1.45*  CALCIUM 8.8* 9.0     Filed Vitals:   08/03/2015 1730 08/06/2015 1745 08/10/2015 1800 08/27/2015 1815  BP: 166/70 127/51 136/80 114/80  Pulse: 76  109   Temp:      TempSrc:      Resp: SpO2: 94%  96%     Exam: VS: afib VR 112   136/72  R 23-25  NRB mask 15L/min 96% Gen on FM O2, eyes barely open, grips bilat hands, but very weak and whisper only verbal Cheynes-Stokes apneic breathing pattern Gurgling cough Diffuse ecchymosis R periorbital, bilat maxillary, R neck and [place] region Sclera anicteric, throat dry No jvd or bruits Chest bilat coarse rhonchi, intermittent exp wheezing during apneic phase, o/w clear Irreg irreg no MRG Abd soft schapoid, +BS no mass nontender no  hsm GU normal male w foley in place draining clear urine MS no joint effusions or deformity Ext no LE or UR edema / no wounds or ulcers Neuro grips R hand, won't grip w L hand, won't move legs to command, eyes half open , whisper verbal knows month and name only  UA > 0-5 rbc/wbc, few bact, 15 ketones, pH 6.0 1.021 EKG (independently reviewed) > afib VR 112 no acute changes, poss old ant MI CXR (independently reviewed) > pending  Home medications > uroxatral, lipitor, rocephin, cipro, digoxin, ensure, zosyn, sennosides, Spiriva , maxzide (triamterene/ HCTZ)  ABG 7.40 / 42/ 66  98% Na 146  K 4.1  BUN 43  Creat 1.45  Glu 117  Alb 2.8  LFT's ok  Lactic acid 2.0 WBC 17k   Hb 12  plt 407   INR 1.3   CT Head today >   1. Sequelae of recent severe head trauma described on the 07/28/2015 head CT (no images available at this time) 2. New low-density right Subdural Hematoma versus Subdural Hygroma measuring 8 mm in thickness.  However, there is no associated midline shift. 3. Left anterior frontal lobe parenchymal hemorrhage with small volume subarachnoid and intraventricular hemorrhage appears to be nonacute. 4. Un healed comminuted bilateral frontal bone and skullbase fractures with hemorrhage layering in the paranasal sinuses.  Assessment: 1. Altered mental status - recent fall w SAH, today's CT shows new subdural hematoma but no shift and is not large.  Has bad cough and may have PNA. CXR's aren't available now due to IT  issue.  WBC up.  Plan admit , Rx empiric for PNA, admit SDU.  Get f/u head CT in 24 hours to f/u SDH.  Is not on anticoagulants (held last admit) fortunately.   2. New subdural hematoma - small, no shift. As above.  3. Afib - vent rate 112, will start IV dilt for now, get rate 80-100 if poss 4. HTN -stable 5. Recent SAH/ IV bleed - after mech fall, rx'd conservatively, coumadin dc'd 6. Facial trauma - also rx'd conservatively  7. COPD by hx 8. Hypernatremia - d5/14 9. Mild acute renal insuff - d5/14  Plan - as above   DVT Prophylaxis SCD Code Status: will d/w fam Family Communication: none here ytet  Disposition Plan: not sure    Maree Krabbe [facility] Pager [phone]  Cell [phone]  If 7PM-7AM, please contact night-coverage [email or web address] Password [email or web address] 08/05/2015, 7:01 PM

## 2015-08-08 NOTE — Progress Notes (Signed)
Pharmacy Antibiotic Note  Marco Maple HudsonMoser is a 82 y.o. male admitted on 08/07/2015 with pneumonia.  Pharmacy has been consulted for vancomycin dosing. Pt is afebrile and WBC is elevated at 17.3, Scr is also elevated at 1.45. Lactic acid is 2.01.   Plan: - Vancomycin 1250mg  IV x 1 then 1gm IV Q24H - F/u renal fxn, C&S, clinical status and [facility] - F/u continuation of zosyn or other gram negative coverage     Temp (24hrs), Avg:98.8 F (37.1 C), Min:98.8 F (37.1 C), Max:98.8 F (37.1 C)   Recent Labs Lab 08/02/15 1433 08/06/2015 1625 08/17/2015 1645  WBC 14.8* 17.3*  --   CREATININE 1.02 1.45*  --   LATICACIDVEN  --   --  2.01*    Estimated Creatinine Clearance: 35 mL/min (by C-G formula based on Cr of 1.45).    Allergies  Allergen Reactions  . Advair Diskus [Fluticasone-Salmeterol] Other (See Comments)    Unknown reaction per son  . Dilaudid [Hydromorphone Hcl] Other (See Comments)    Makes patient very loopy and disoriented  . Morphine And Related Other (See Comments)    Loopy and disoriented    Antimicrobials this admission: Vanc 4/11>> Zosyn x 1 4/11  Dose adjustments this admission: N/A  Microbiology results: Pending  Thank you for allowing pharmacy to be a part of this patient's care.  Shanan Fitzpatrick, Drake LeachRachel Lynn 08/12/2015 5:43 PM

## 2015-08-08 NOTE — ED Notes (Signed)
MD [facility]app at the bedside  

## 2015-08-08 NOTE — ED Notes (Addendum)
Per EMS, Pt is coming [facility] where he is there for rehab from a fall on March 31st. While He was in the hospital, pt was diagnosed with UTI, discharged and went to [facility] on 4/6. Pt was diagnosed with pneumonia. Was taking Cipro and Rocephin. Finished the Cipro, but did not finish the Rocephin. Rocephin was d/c and took one treatment of Zosyn today. Family noticed patient was more lethargic than normal yesterday. Staff reported worsening AMS. Pt's baseline is verbal response. Hx of facial fractures, subarachnoid bleed per fall a couple weeks ago. Initial GCS was 5, 95% on 2L, 90% on RA, 106/74, 157 HR, 32 RR, 110 CBG

## 2015-08-08 NOTE — ED Notes (Signed)
Called RT for deep suction  

## 2015-08-08 NOTE — ED Notes (Signed)
Dr.Schertz at bedside  

## 2015-08-08 NOTE — ED Provider Notes (Signed)
CSN: [id number]     Arrival date & time 08/30/15  1547 History   First MD Initiated Contact with Patient 2015/08/30 1550     Chief Complaint  Patient presents with  . Altered Mental Status    HPI Patient presents to the emergency room with altered mental status.  The patient has a complex recent medical history. He was admitted to the hospital on April 1 after falling and striking his head. The patient was noted to have frontal bone fractures, pneumocephalus, and multiple intracranial bleeds. Patient was recently discharged to a nursing care facility. According to staff at the facility normally he was still speaking and communicating. Today the patient is very listless.  He has not been eating or drinking well. The patient is unable to answer any questions for me although he will respond to voice. Past Medical History  Diagnosis Date  . PAF (paroxysmal atrial fibrillation) (HCC)     Managed with rate control and coumadin  . COPD (chronic obstructive pulmonary disease) (HCC)   . HTN (hypertension)   . Hyperlipemia   . OA (osteoarthritis)   . Chronic anticoagulation   . Incomplete rotator cuff tear or rupture of right shoulder, not specified as traumatic 2012  . Chronic anticoagulation   . Nocturia   . Osteoarthritis of right hip 03/31/2012  . Shortness of breath    Past Surgical History  Procedure Laterality Date  . Total hip arthroplasty  1998  . Shoulder arthroscopy  1994    RIGHT  . Cholecystectomy    . Appendectomy    . Cataract extraction w/ intraocular lens  implant, bilateral    . Right knee arthroscopy    . Total hip arthroplasty  03/31/2012    RIGHT HIP  . Total hip arthroplasty  03/31/2012    Procedure: TOTAL HIP ARTHROPLASTY;  Surgeon: Eulas Post, MD;  Location: [facility] OR;  Service: Orthopedics;  Laterality: Right;   Family History  Problem Relation Age of Onset  . Cancer Father   . Cancer Mother   . Other Brother     SUICIDE   Social History  Substance Use Topics   . Smoking status: Current Some Day Smoker -- 44 years    Types: Pipe  . Smokeless tobacco: Never Used     Comment: Smoked cigarettes early age socially  . Alcohol Use: No    Review of Systems  All other systems reviewed and are negative.     Allergies  Advair diskus; Dilaudid; and Morphine and related  Home Medications   Prior to Admission medications   Medication Sig Start Date End Date Taking? Authorizing Provider  alfuzosin (UROXATRAL) 10 MG 24 hr tablet Take 10 mg by mouth 2 (two) times daily. 05/16/15  Yes Historical Provider, MD  atorvastatin (LIPITOR) 10 MG tablet Take 10 mg by mouth daily.     Yes Historical Provider, MD  CefTRIAXone Sodium (ROCEPHIN IJ) Inject 1 g as directed once.   Yes Historical Provider, MD  ciprofloxacin (CIPRO) 500 MG tablet Take 1 tablet (500 mg total) by mouth 2 (two) times daily. 08/03/15  Yes Freeman Caldron, PA-C  digoxin (LANOXIN) 0.125 MG tablet Take 125 mcg by mouth daily. 06/13/15  Yes Historical Provider, MD  feeding supplement, ENSURE COMPLETE, (ENSURE COMPLETE) LIQD Take 237 mLs by mouth 2 (two) times daily between meals. Patient taking differently: Take 237 mLs by mouth daily.  05/20/14  Yes Leroy Sea, MD  piperacillin-tazobactam (ZOSYN) 3.375 GM/50ML IVPB Inject 3.375 g  into the vein every 8 (eight) hours.   Yes Historical Provider, MD  Sennosides (SENEXON PO) Take 1 tablet by mouth daily as needed (constipation).   Yes Historical Provider, MD  tiotropium (SPIRIVA) 18 MCG inhalation capsule Place 18 mcg into inhaler and inhale daily.   Yes Historical Provider, MD  triamterene-hydrochlorothiazide (MAXZIDE-25) 37.5-25 MG per tablet Take 0.5 tablets by mouth daily. 12/19/14  Yes Historical Provider, MD   BP 121/74 mmHg  Pulse 120  Temp(Src) 98.8 F (37.1 C) (Rectal)  Resp 27  SpO2 89% Physical Exam  Constitutional: He appears listless. No distress.  HENT:  Head: Normocephalic.  Right Ear: External ear normal.  Left Ear:  External ear normal.  Extensive ecchymoses on the face and neck, mucous membranes are dry, whitish exudate on the oropharynx and palate  Eyes: Conjunctivae are normal. Right eye exhibits no discharge. Left eye exhibits no discharge. No scleral icterus.  Neck: Neck supple. No tracheal deviation present.  Cardiovascular: Intact distal pulses.  An irregular rhythm present. Tachycardia present.   Pulmonary/Chest: Accessory muscle usage present. No stridor. No respiratory distress. He has no decreased breath sounds. He has no wheezes. He has no rhonchi. He has no rales.  Labored breathing  Abdominal: Soft. Bowel sounds are normal. He exhibits no distension. There is no tenderness. There is no rebound and no guarding.  Musculoskeletal:  Pain with range of motion of his lower extremities, no wounds noted on his back, erythema noted in the sacrum  Neurological: He appears listless. No cranial nerve deficit (no facial droop, extraocular movements intact,  ). He exhibits normal muscle tone. He displays no seizure activity. Coordination normal. GCS eye subscore is 4. GCS verbal subscore is 3. GCS motor subscore is 6.  Patient is whispering and hard to understand his speech, generalized weakness, patient is unable to sit up or roll over in bed on his own  Skin: Skin is warm and dry. No rash noted. He is not diaphoretic.  Psychiatric: He has a normal mood and affect.  Nursing note and vitals reviewed.   ED Course  Procedures (including critical care time)  CRITICAL CARE Performed by: ZOXWR,UEA Total critical care time: 35 minutes Critical care time was exclusive of separately billable procedures and treating other patients. Critical care was necessary to treat or prevent imminent or life-threatening deterioration. Critical care was time spent personally by me on the following activities: development of treatment plan with patient and/or surrogate as well as nursing, discussions with consultants,  evaluation of patient's response to treatment, examination of patient, obtaining history from patient or surrogate, ordering and performing treatments and interventions, ordering and review of laboratory studies, ordering and review of radiographic studies, pulse oximetry and re-evaluation of patient's condition.   Labs Review  Labs Reviewed  PROTIME-INR - Abnormal; Notable for the following:    Prothrombin Time 17.2 (*)    All other components within normal limits  CBC - Abnormal; Notable for the following:    WBC 17.3 (*)    RBC 4.12 (*)    Hemoglobin 12.3 (*)    HCT 38.4 (*)    Platelets 407 (*)    All other components within normal limits  DIFFERENTIAL - Abnormal; Notable for the following:    Neutro Abs 13.2 (*)    All other components within normal limits  COMPREHENSIVE METABOLIC PANEL - Abnormal; Notable for the following:    Sodium 146 (*)    Glucose, Bld 117 (*)    BUN 43 (*)  Creatinine, Ser 1.45 (*)    Albumin 2.8 (*)    Total Bilirubin 1.5 (*)    GFR calc non Af Amer 42 (*)    GFR calc Af Amer 49 (*)    All other components within normal limits  URINALYSIS, ROUTINE W REFLEX MICROSCOPIC (NOT AT [facility]) - Abnormal; Notable for the following:    Hgb urine dipstick SMALL (*)    Ketones, ur 15 (*)    Protein, ur 30 (*)    Leukocytes, UA TRACE (*)    All other components within normal limits  URINE MICROSCOPIC-ADD ON - Abnormal; Notable for the following:    Squamous Epithelial / LPF 0-5 (*)    Bacteria, UA FEW (*)    All other components within normal limits  I-STAT CG4 LACTIC ACID, ED - Abnormal; Notable for the following:    Lactic Acid, Venous 2.01 (*)    All other components within normal limits  I-STAT ARTERIAL BLOOD GAS, ED - Abnormal; Notable for the following:    pO2, Arterial 66.0 (*)    Bicarbonate 26.2 (*)    All other components within normal limits  CULTURE, BLOOD (ROUTINE X 2)  CULTURE, BLOOD (ROUTINE X 2)  URINE CULTURE  APTT    Imaging  Review Ct Head Wo Contrast  11-17-2015  CLINICAL DATA:  82 year old male recently treated for UTI and pneumonia, discharged 08/03/2015. Increased lethargy in worsening altered mental status. Initial encounter. EXAM: CT HEAD WITHOUT CONTRAST TECHNIQUE: Contiguous axial images were obtained from the base of the skull through the vertex without intravenous contrast. COMPARISON:  Report of noncontrast head CT 07/28/2015 (no images available). FINDINGS: Comminuted skull fractures and intracranial hemorrhage were described in March. Non healed comminuted bilateral frontal bone skull fractures persist. Associated bilateral skullbase fractures including at the maxillary sinuses. Hemorrhage a levels in the maxillary and sphenoid sinuses. Tympanic cavities and mastoids remain clear. No acute orbit or scalp soft tissue finding identified. Left frontal parenchymal hemorrhage was described in March. Residual hyperdense blood products along the anterior left frontal lobe ir noted in addition to an area of developing cortical encephalomalacia. There is a new right side extra-axial low density collection measuring up to 8 mm in thickness. There is mild mass effect on the right hemisphere, but no associated midline shift. There is a small volume of bilateral occipital horn intraventricular hemorrhage - subarachnoid hemorrhage was described in March. Basilar cisterns remain patent. No ventriculomegaly. Periventricular and other patchy white matter hypodensity. No acute cortically based infarct identified. Calcified atherosclerosis at the skull base. IMPRESSION: 1. Sequelae of recent severe head trauma described on the 07/28/2015 head CT (no images available at this time). 2. New low-density right Subdural Hematoma versus Subdural Hygroma measuring 8 mm in thickness. However, there is no associated midline shift. 3. Left anterior frontal lobe parenchymal hemorrhage with small volume subarachnoid and intraventricular hemorrhage  appears to be nonacute. 4. Un healed comminuted bilateral frontal bone and skullbase fractures with hemorrhage layering in the paranasal sinuses. #2 discussed by telephone with Dr. Linwood DibblesJON Jhony Antrim on 11-17-2015 at 17:08 . Electronically Signed   By: Odessa FlemingH  Hall M.D.   On: 007-21-2017 17:15   I have personally reviewed and evaluated these images and lab results as part of my medical decision-making.   EKG Interpretation   Date/Time:  Tuesday August 08 2015 15:52:40 EDT Ventricular Rate:  121 PR Interval:    QRS Duration: 109 QT Interval:  348 QTC Calculation: 494 R Axis:   95 Text  Interpretation:  discard Confirmed by Jaylia Pettus  MD-J, Jhaden Pizzuto [phone]) on  08/17/2015 4:14:26 PM     EKG Rate 102, atrial fibrillation Borderline right axis deviation No ST or T-wave changes No prior EKG for comparison  Medications  vancomycin (VANCOCIN) 1,250 mg in sodium chloride 0.9 % 250 mL IVPB (1,250 mg Intravenous New Bag/Given 08/18/2015 1802)  vancomycin (VANCOCIN) IVPB 1000 mg/200 mL premix (not administered)  sodium chloride 0.9 % bolus 500 mL (0 mLs Intravenous Stopped 07/31/2015 1752)  piperacillin-tazobactam (ZOSYN) IVPB 3.375 g (3.375 g Intravenous New Bag/Given 08/07/2015 1802)    MDM   Final diagnoses:  Subdural hemorrhage (HCC)  Atrial fibrillation, unspecified type (HCC)    CT scan shows new fluid collection without midline shift.  Question new bleeding versus hygroma.  Still tachy in a fib but normal BP.  Now with worsening oxygen saturation.  Xray pending.  Concerned about possible aspiration, pneumonia.  Discussed case with Dr Bevely Palmer.  No need for emergent treatment at this time.  Empiric abx started.  Plan on admission to the hospital for further treatment.   Linwood Dibbles, MD 08/07/2015 [phone]

## 2015-08-08 NOTE — ED Notes (Signed)
Pt placed on a NRB per Dr.[facility].

## 2015-08-08 NOTE — Progress Notes (Signed)
Patient arrived to 3S12 via stretcher from ED.  Patient transferred to bed and hooked up to monitor.  CCMD and Elink notified of patient's arrival.  Patient is calm but not alert enough to hold a conversation.  No family is present at this time.  No belongings came with patient to his room.  Will continue to monitor patient.

## 2015-08-09 ENCOUNTER — Inpatient Hospital Stay (HOSPITAL_COMMUNITY): Payer: Medicare Other

## 2015-08-09 ENCOUNTER — Encounter (HOSPITAL_COMMUNITY): Payer: Self-pay | Admitting: Radiology

## 2015-08-09 DIAGNOSIS — L8990 Pressure ulcer of unspecified site, unspecified stage: Secondary | ICD-10-CM | POA: Insufficient documentation

## 2015-08-09 LAB — CBC
HEMATOCRIT: 35.8 % — AB (ref 39.0–52.0)
HEMOGLOBIN: 11.4 g/dL — AB (ref 13.0–17.0)
MCH: 29.9 pg (ref 26.0–34.0)
MCHC: 31.8 g/dL (ref 30.0–36.0)
MCV: 94 fL (ref 78.0–100.0)
Platelets: 381 10*3/uL (ref 150–400)
RBC: 3.81 MIL/uL — ABNORMAL LOW (ref 4.22–5.81)
RDW: 14.5 % (ref 11.5–15.5)
WBC: 18.2 10*3/uL — ABNORMAL HIGH (ref 4.0–10.5)

## 2015-08-09 LAB — BASIC METABOLIC PANEL
ANION GAP: 13 (ref 5–15)
BUN: 40 mg/dL — ABNORMAL HIGH (ref 6–20)
CO2: 24 mmol/L (ref 22–32)
Calcium: 8.6 mg/dL — ABNORMAL LOW (ref 8.9–10.3)
Chloride: 107 mmol/L (ref 101–111)
Creatinine, Ser: 1.16 mg/dL (ref 0.61–1.24)
GFR calc Af Amer: >60 mL/min (ref >60)
GFR calc non Af Amer: 55 mL/min — ABNORMAL LOW (ref >60)
GLUCOSE: 136 mg/dL — AB (ref 65–99)
POTASSIUM: 3.6 mmol/L (ref 3.5–5.1)
Sodium: 144 mmol/L (ref 135–145)

## 2015-08-09 LAB — MRSA PCR SCREENING: MRSA by PCR: NEGATIVE

## 2015-08-09 MED ORDER — FUROSEMIDE 10 MG/ML IJ SOLN
40.0000 mg | Freq: Once | INTRAMUSCULAR | Status: AC
  Administered 2015-08-09: 40 mg via INTRAVENOUS
  Filled 2015-08-09: qty 4

## 2015-08-09 MED ORDER — IOPAMIDOL (ISOVUE-370) INJECTION 76%
INTRAVENOUS | Status: AC
  Administered 2015-08-09: 100 mL
  Filled 2015-08-09: qty 100

## 2015-08-09 MED ORDER — BOOST / RESOURCE BREEZE PO LIQD: 1.0000 | Freq: Three times a day (TID) | ORAL | Status: DC

## 2015-08-09 NOTE — Progress Notes (Addendum)
Initial Nutrition Assessment  DOCUMENTATION CODES:   Not applicable  INTERVENTION:  -Boost Breeze po TID, each supplement provides 250 kcal and 9 grams of protein -RD continue to monitor   NUTRITION DIAGNOSIS:   Malnutrition related to chronic illness as evidenced by severe depletion of body fat, severe depletion of muscle mass.  GOAL:   Patient will meet greater than or equal to 90% of their needs  MONITOR:   PO intake, Supplement acceptance, Labs, Weight trends, Skin  REASON FOR ASSESSMENT:   Malnutrition Screening Tool    ASSESSMENT:   Kyle [name] is a 82 y.o. male with hx of HTN, afib, COPD. He was here 2 weeks ago for 7d hosp stay after mechanical fall with multiple facial fractures R face and L frontal lobe SAH w some IV bleeding. There was no shift and MS was stable, as below NSurg rec'd conserviative management and pt was dc'd to SNF ([facility]  Attempted to speak to Kyle [name] @ bedside but he is still suffering from AMS on Venturi mask. He kept saying something that was unintelligible.  No family at bedside. Tx from SNF.   Nutrition-Focused physical exam completed. Findings are severe fat depletion, severe muscle depletion, and no edema.   No further notes on pt at this time.  Will follow-up as mentation increases. If he is unable to eat PO -> Recommend Nutrition Support.  Diet Order:  Diet NPO time specified  Skin:  Wound (see comment) (Stage 1 PU to Sacrum)  Last BM:  4/11  Height:   Ht Readings from Last 1 Encounters:  Sep 03, 2015 6' (1.829 m)    Weight:   Wt Readings from Last 1 Encounters:  08/09/15 135 lb 5.8 oz (61.4 kg)    Ideal Body Weight:  80.9 kg  BMI:  Body mass index is 18.35 kg/(m^2).  Estimated Nutritional Needs:   Kcal:  1700-1900  Protein:  90-100 grams  Fluid:  >/= 1.7L  EDUCATION NEEDS:   No education needs identified at this time  Dionne AnoWilliam M. Markevion Lattin, MS, RD LDN After Hours/Weekend Pager [phone]

## 2015-08-09 NOTE — Progress Notes (Signed)
SLP Cancellation Note  Patient Details Name: Aletta EdouardMyrl Venturini MRN: [id number] DOB: 02/21/1927   Cancelled treatment:       Reason Eval/Treat Not Completed: Medical issues which prohibited therapy;Patient not medically ready. Discussed with RN, will follow and assess as pt able to participate.   Rocky CraftsKara E Johnathyn Viscomi MA, CCC-SLP Pager [phone] 08/09/2015, 2:05 PM

## 2015-08-09 NOTE — Progress Notes (Signed)
Patients 02 saturation dropped to 79% on 4L [place], increased patient up to Venturi mask at 50% pt. Oxygen saturation maintaining in the mid 90's on venturi mask.  Paged Dr. Susie CassetteAbrol awaiting call back.  Will continue to monitor.

## 2015-08-09 NOTE — Progress Notes (Addendum)
[facility] PROGRESS NOTE  Kyle [name] ZOX:[id number] DOB: [birth year] DOA: 08/22/2015 PCP: [name], MD  Length of stay: 1   Assessment/Plan: Principal Problem:   Altered mental status Active Problems:   Atrial fibrillation with RVR (HCC)   COPD (chronic obstructive pulmonary disease) (HCC)   Weakness generalized   Leukocytosis   Facial trauma after recent fall   Subarachnoid hemorrhage (HCC) recent after fall   Cheyne-Stokes respiration   Pressure ulcer   Brief summary Kyle [name] is a 82 y.o. male with hx of HTN, afib, COPD. He was here 2 weeks ago for 7d hosp stay after mechanical fall with multiple facial fractures R face and L frontal lobe SAH w some IV bleeding. There was no shift and MS was stable, as below NSurg rec'd conserviative management and pt was dc'd to SNF ([facility]). Yesterday family was visitng and noticed some decrease in MS. Today per SNF staff MS was worse again so sent to ED. Here pt is very weak, whispering, disoriented. Creat / Na up slightly, has rhonchorous cough/ congestion. CXR not available due to IT issues. WBC up 17k.  Assessment and plan  1. Acute toxic/metabolic encephalopathy - recent fall w SAH, today's CT shows new subdural hematoma but no shift and is not large. Has bad cough and may have PNA versus secondary to aspiration. Initial chest x-ray negative. Patient afebrile. However hypoxic and worsening. Repeat chest x-ray. Continue empiric broad-spectrum antibiotics for healthcare associated pneumonia. empiric for PNA, follow blood cultures closely. Continue SDU.  2. New subdural hematoma - CT of the head done due to recent subdural hematoma secondary to head trauma on 07/28/15. Patient found to have new low-density right subdural hematoma versus subdural hygroma measuring 8 mm. There is no midline shift. Given acute change in mental status over the last couple of days. Will order MRI of the brain to rule out CVA. If no improvement  in the next 24 hours. Strongly recommend palliative care consult. Patient off Coumadin after last admission. Stroke is certainly a possibility as the patient has been taken off anticoagulation. 3. Acute hypoxemic respiratory failure-likely secondary to aspiration, continue broad-spectrum antibiotics, speech therapy evaluation, oxygen per protocol. Aspiration precautions. Pulmonary embolism is a possibility however the patient. CT chest to rule out obstructing mass due to new left lower lobe collapse seen on chest x-ray 4. Afib - vent rate 112, continue IV dilt for now as the patient unable to swallow, target heart rate less than 120. Off anticoagulation. Therefore high risk for stroke. 5. HTN -stable 6. Recent SAH/ IV bleed - after mech fall, rx'd conservatively, coumadin dc'd during last admission 7. Facial trauma - also rx'd conservatively  8. COPD by hx 9. Hypernatremia - d5/14 10. Mild acute renal insuff -IV fluids discontinued given shortness of breath,   DVT prophylaxsis SCDs  Code Status:      Code Status Orders        Start     Ordered   07/29/2015 2153  Full code   Continuous     08/05/2015 2153    Code Status History      Family Communication: Discussed in detail with the patient, all imaging results, lab results explained to the patient   Disposition Plan: Anticipate patient will need to transition to hospice care soon, palliative care consultation for  goals of care     Consultants:  Palliative care  Procedures:  None  Antibiotics: Anti-infectives    Start  Dose/Rate Route Frequency Ordered Stop   08/09/15 1800  vancomycin (VANCOCIN) IVPB 1000 mg/200 mL premix     1,000 mg 200 mL/hr over 60 Minutes Intravenous Every 24 hours August 13, 2015 1743     08/09/15 0400  piperacillin-tazobactam (ZOSYN) IVPB 3.375 g     3.375 g 12.5 mL/hr over 240 Minutes Intravenous 3 times per day 08/07/2015 2153     07/29/2015 1745  piperacillin-tazobactam (ZOSYN) IVPB 3.375 g     3.375  g 100 mL/hr over 30 Minutes Intravenous  Once Aug 13, 2015 1739 August 13, 2015 1832   08/27/2015 1745  vancomycin (VANCOCIN) 1,250 mg in sodium chloride 0.9 % 250 mL IVPB     1,250 mg 166.7 mL/hr over 90 Minutes Intravenous STAT 08/25/2015 1743 07/29/2015 1952         HPI/Subjective: Confused, hypoxic, would not respond to commands,  Objective: Filed Vitals:   08/09/15 1139 08/09/15 1200 08/09/15 1201 08/09/15 1211  BP:  127/76 127/76   Pulse: 74 83 97 78  Temp:   97.8 F (36.6 C)   TempSrc:   Axillary   Resp: 21 21 21 18   Height:      Weight:      SpO2: 79% 98% 99% 100%    Intake/Output Summary (Last 24 hours) at 08/09/15 1246 Last data filed at 08/09/15 1000  Gross per 24 hour  Intake  702.5 ml  Output   1450 ml  Net -747.5 ml    Exam:  Gen on FM O2, eyes barely open, grips bilat hands, but very weak and whisper only verbal Cheynes-Stokes apneic breathing pattern Gurgling cough Diffuse ecchymosis R periorbital, bilat maxillary, R neck and [place] region Sclera anicteric, throat dry No jvd or bruits Chest bilat coarse rhonchi, intermittent exp wheezing during apneic phase, o/w clear Irreg irreg no MRG Abd soft schapoid, +BS no mass nontender no hsm GU normal male w foley in place draining clear urine MS no joint effusions or deformity  Data Review   Micro Results Recent Results (from the past 240 hour(s))  Culture, Urine     Status: Abnormal   Collection Time: 08/03/15 11:22 AM  Result Value Ref Range Status   Specimen Description URINE, CATHETERIZED  Final   Special Requests NONE  Final   Culture >=100,000 COLONIES/mL PSEUDOMONAS AERUGINOSA (A)  Final   Report Status 08/06/2015 FINAL  Final   Organism ID, Bacteria PSEUDOMONAS AERUGINOSA (A)  Final      Susceptibility   Pseudomonas aeruginosa - MIC*    CEFTAZIDIME 2 SENSITIVE Sensitive     CIPROFLOXACIN 2 INTERMEDIATE Intermediate     GENTAMICIN <=1 SENSITIVE Sensitive     IMIPENEM 2 SENSITIVE Sensitive     PIP/TAZO  <=4 SENSITIVE Sensitive     CEFEPIME <=1 SENSITIVE Sensitive     * >=100,000 COLONIES/mL PSEUDOMONAS AERUGINOSA  Urine culture     Status: None (Preliminary result)   Collection Time: 08/24/2015  4:27 PM  Result Value Ref Range Status   Specimen Description URINE, CATHETERIZED  Final   Special Requests NONE  Final   Culture NO GROWTH < 24 HOURS  Final   Report Status PENDING  Incomplete  MRSA PCR Screening     Status: None   Collection Time: 08/25/2015 10:05 PM  Result Value Ref Range Status   MRSA by PCR NEGATIVE NEGATIVE Final    Comment:        The GeneXpert MRSA Assay (FDA approved for NASAL specimens only), is one component of a comprehensive MRSA colonization  surveillance program. It is not intended to diagnose MRSA infection nor to guide or monitor treatment for MRSA infections.     Radiology Reports Ct Head Wo Contrast  08/09/2015  CLINICAL DATA:  Intracranial hemorrhage EXAM: CT HEAD WITHOUT CONTRAST TECHNIQUE: Contiguous axial images were obtained from the base of the skull through the vertex without intravenous contrast. COMPARISON:  08/12/15 at 16:28 FINDINGS: There is no interval change in the left frontal intraparenchymal hemorrhage. The continues to be a small volume subarachnoid blood within sulci. There is developing left frontal encephalomalacia in the area of hemorrhagic contusion. There is no interval change and the intraventricular blood, visible in both occipital horns. There is no interval change in the right subdural hygroma, measuring approximately 8 mm and with mild mass effect on the sulci and right lateral ventricle although no midline shift. Basal cisterns remain patent. Generalized atrophy and chronic white matter hypodensity persist without significant interval change. Bifrontal fractures extending down the lateral orbits and across the maxillary sinuses, unchanged. Blood levels again noted in the maxillary and sphenoid sinuses. Mastoids and middle ears  appear clear. IMPRESSION: No interval change in the left frontal intraparenchymal hemorrhage with encephalomalacia. Unchanged small volume subarachnoid and intraventricular hemorrhage. Unchanged right subdural hygroma without midline shift. Electronically Signed   By: Ellery Plunk M.D.   On: 08/09/2015 02:23   Ct Head Wo Contrast  08/12/15  CLINICAL DATA:  82 year old male recently treated for UTI and pneumonia, discharged 08/03/2015. Increased lethargy in worsening altered mental status. Initial encounter. EXAM: CT HEAD WITHOUT CONTRAST TECHNIQUE: Contiguous axial images were obtained from the base of the skull through the vertex without intravenous contrast. COMPARISON:  Report of noncontrast head CT 07/28/2015 (no images available). FINDINGS: Comminuted skull fractures and intracranial hemorrhage were described in March. Non healed comminuted bilateral frontal bone skull fractures persist. Associated bilateral skullbase fractures including at the maxillary sinuses. Hemorrhage a levels in the maxillary and sphenoid sinuses. Tympanic cavities and mastoids remain clear. No acute orbit or scalp soft tissue finding identified. Left frontal parenchymal hemorrhage was described in March. Residual hyperdense blood products along the anterior left frontal lobe ir noted in addition to an area of developing cortical encephalomalacia. There is a new right side extra-axial low density collection measuring up to 8 mm in thickness. There is mild mass effect on the right hemisphere, but no associated midline shift. There is a small volume of bilateral occipital horn intraventricular hemorrhage - subarachnoid hemorrhage was described in March. Basilar cisterns remain patent. No ventriculomegaly. Periventricular and other patchy white matter hypodensity. No acute cortically based infarct identified. Calcified atherosclerosis at the skull base. IMPRESSION: 1. Sequelae of recent severe head trauma described on the  07/28/2015 head CT (no images available at this time). 2. New low-density right Subdural Hematoma versus Subdural Hygroma measuring 8 mm in thickness. However, there is no associated midline shift. 3. Left anterior frontal lobe parenchymal hemorrhage with small volume subarachnoid and intraventricular hemorrhage appears to be nonacute. 4. Un healed comminuted bilateral frontal bone and skullbase fractures with hemorrhage layering in the paranasal sinuses. #2 discussed by telephone with Dr. Linwood Dibbles on 2015-08-12 at 17:08 . Electronically Signed   By: Odessa Fleming M.D.   On: 2015/08/12 17:15   Ct Head Wo Contrast  07/28/2015  CLINICAL DATA:  Fall. EXAM: CT HEAD WITHOUT CONTRAST CT MAXILLOFACIAL WITHOUT CONTRAST CT CERVICAL SPINE WITHOUT CONTRAST TECHNIQUE: Multidetector CT imaging of the head, cervical spine, and maxillofacial structures were performed using the standard protocol  without intravenous contrast. Multiplanar CT image reconstructions of the cervical spine and maxillofacial structures were also generated. COMPARISON:  CT scan of head of February 09, 2013. FINDINGS: CT HEAD FINDINGS Mildly depressed and comminuted frontal skull fracture is noted. Mild bilateral frontal subarachnoid hemorrhage is noted. Mild diffuse cortical atrophy is noted. Mild chronic ischemic white matter disease is noted. Ventricular size is within normal limits. No midline shift is noted. Possible 1 cm left frontal intraparenchymal hemorrhage is noted. CT MAXILLOFACIAL FINDINGS The mandible appears normal moderately depressed left nasal bone fracture is noted. Mildly displaced left zygomatic arch fracture is noted, with mildly displaced left posterior maxillary wall fracture. Left pterygoid plate appears normal. Moderately displaced and comminuted fracture is seen involving right pterygoid plate with associated fracture of the posterior wall of the right maxillary sinus. Minimally displaced fracture is seen involving the right lamina  papyracea. Moderately displaced right anterior maxillary wall fracture is noted. Hemorrhage is noted in both maxillary sinuses as well as probably in the ethmoid and sphenoid sinuses. Fractures are seen involving the lateral orbital walls bilaterally. Mildly displaced fractures are seen involving the superior orbital roofs bilaterally. Mildly displaced right orbital floor fracture is noted. Large amount of soft tissue gas is seen in the soft tissues lateral to the right maxillary sinus. CT CERVICAL SPINE FINDINGS No significant spondylolisthesis is noted. Mildly displaced fracture is seen involving the posterior spinous process of C5. Mild degenerative disc disease is noted at C3-4, C5-6 and C6-7. Visualized lung apices are unremarkable. IMPRESSION: Mild diffuse cortical atrophy. Mild chronic ischemic white matter disease. Mild bilateral frontal subarachnoid hemorrhage is noted. Probable 1 cm left frontal intraparenchymal hemorrhage is noted. No midline shift is noted. Small amount of pneumocephalus is noted. Mildly depressed and comminuted bilateral frontal skull fracture is noted. Moderately depressed left nasal bone fracture. Mildly displaced left zygomatic arch fracture. Mildly displaced left posterior maxillary wall fracture. Moderately displaced and comminuted fracture is seen involving the posterior wall of the right maxillary sinus and the right pterygoid plate. Hemorrhage is noted in both maxillary, ethmoid and sphenoid sinuses. Minimally displaced fracture seen involving the right lamina papyracea. Moderately displaced right anterior maxillary wall fracture is noted. Mildly displaced fractures are seen involving the superior orbital roofs bilaterally. Mildly displaced right orbital floor fracture is noted. Mildly displaced bilateral lateral orbital wall fractures. Mildly displaced fracture is seen involving posterior spinous process of C5. Multilevel degenerative disc disease is noted in the cervical  spine. No significant spondylolisthesis is noted. Critical Value/emergent results were called by telephone at the time of interpretation on 07/28/2015 at 5:43 pm to Dr. Bary Castilla , who verbally acknowledged these results. Electronically Signed   By: Lupita Raider, M.D.   On: 07/28/2015 17:44   Ct Cervical Spine Wo Contrast  07/28/2015  CLINICAL DATA:  Fall. EXAM: CT HEAD WITHOUT CONTRAST CT MAXILLOFACIAL WITHOUT CONTRAST CT CERVICAL SPINE WITHOUT CONTRAST TECHNIQUE: Multidetector CT imaging of the head, cervical spine, and maxillofacial structures were performed using the standard protocol without intravenous contrast. Multiplanar CT image reconstructions of the cervical spine and maxillofacial structures were also generated. COMPARISON:  CT scan of head of February 09, 2013. FINDINGS: CT HEAD FINDINGS Mildly depressed and comminuted frontal skull fracture is noted. Mild bilateral frontal subarachnoid hemorrhage is noted. Mild diffuse cortical atrophy is noted. Mild chronic ischemic white matter disease is noted. Ventricular size is within normal limits. No midline shift is noted. Possible 1 cm left frontal intraparenchymal hemorrhage is noted. CT MAXILLOFACIAL  FINDINGS The mandible appears normal moderately depressed left nasal bone fracture is noted. Mildly displaced left zygomatic arch fracture is noted, with mildly displaced left posterior maxillary wall fracture. Left pterygoid plate appears normal. Moderately displaced and comminuted fracture is seen involving right pterygoid plate with associated fracture of the posterior wall of the right maxillary sinus. Minimally displaced fracture is seen involving the right lamina papyracea. Moderately displaced right anterior maxillary wall fracture is noted. Hemorrhage is noted in both maxillary sinuses as well as probably in the ethmoid and sphenoid sinuses. Fractures are seen involving the lateral orbital walls bilaterally. Mildly displaced fractures are  seen involving the superior orbital roofs bilaterally. Mildly displaced right orbital floor fracture is noted. Large amount of soft tissue gas is seen in the soft tissues lateral to the right maxillary sinus. CT CERVICAL SPINE FINDINGS No significant spondylolisthesis is noted. Mildly displaced fracture is seen involving the posterior spinous process of C5. Mild degenerative disc disease is noted at C3-4, C5-6 and C6-7. Visualized lung apices are unremarkable. IMPRESSION: Mild diffuse cortical atrophy. Mild chronic ischemic white matter disease. Mild bilateral frontal subarachnoid hemorrhage is noted. Probable 1 cm left frontal intraparenchymal hemorrhage is noted. No midline shift is noted. Small amount of pneumocephalus is noted. Mildly depressed and comminuted bilateral frontal skull fracture is noted. Moderately depressed left nasal bone fracture. Mildly displaced left zygomatic arch fracture. Mildly displaced left posterior maxillary wall fracture. Moderately displaced and comminuted fracture is seen involving the posterior wall of the right maxillary sinus and the right pterygoid plate. Hemorrhage is noted in both maxillary, ethmoid and sphenoid sinuses. Minimally displaced fracture seen involving the right lamina papyracea. Moderately displaced right anterior maxillary wall fracture is noted. Mildly displaced fractures are seen involving the superior orbital roofs bilaterally. Mildly displaced right orbital floor fracture is noted. Mildly displaced bilateral lateral orbital wall fractures. Mildly displaced fracture is seen involving posterior spinous process of C5. Multilevel degenerative disc disease is noted in the cervical spine. No significant spondylolisthesis is noted. Critical Value/emergent results were called by telephone at the time of interpretation on 07/28/2015 at 5:43 pm to Dr. Bary CastillaOURTENEY MACKUEN , who verbally acknowledged these results. Electronically Signed   By: Lupita RaiderJames  Green Jr, M.D.   On:  07/28/2015 17:44   Dg Hand 2 View Left  07/28/2015  CLINICAL DATA:  Larey SeatFell today, pain at MCP joints, bruising EXAM: LEFT HAND - 2 VIEW COMPARISON:  LEFT wrist radiographs 05/19/2014 FINDINGS: Diffuse osseous demineralization. Scattered degenerative changes of interphalangeal joints. Osteolysis versus resection of trapezium. Degenerative changes first and second MCP joints. No acute fracture, dislocation, or bone destruction. Soft tissue swelling at distal phalanx index finger. Corticated ossicle noted dorsal to mid carpals appear present since 2016. Additional osseous density dorsal to the distal carpal row, seen only on lateral view, was not definitely visualized previously and is indeterminate in age. IMPRESSION: Osseous demineralization with scattered degenerative changes as above. Additional calcific density is seen dorsal to the distal carpal row on lateral view new since prior exam, age-indeterminate ; correlation for pain/tenderness at this site recommended to exclude acute fracture. Electronically Signed   By: Ulyses SouthwardMark  Boles M.D.   On: 07/28/2015 18:39   Dg Chest Portable 1 View  10-08-2015  CLINICAL DATA:  Altered mental status EXAM: PORTABLE CHEST 1 VIEW COMPARISON:  07/28/2015 FINDINGS: The lungs are hyperinflated likely secondary to COPD. There is no focal parenchymal opacity. There is no pleural effusion or pneumothorax. The heart and mediastinal contours are unremarkable. The  osseous structures are unremarkable. IMPRESSION: No active disease. Electronically Signed   By: Elige Ko   On: Aug 17, 2015 19:44   Dg Chest Portable 1 View  07/28/2015  CLINICAL DATA:  82 year old male with fall. EXAM: PORTABLE CHEST 1 VIEW COMPARISON:  07/28/2015 and prior radiographs FINDINGS: The cardiomediastinal silhouette is unremarkable. Right lower lung atelectasis versus airspace disease noted. There is no evidence of pulmonary edema, suspicious pulmonary nodule/mass, pleural effusion, or pneumothorax. No acute  bony abnormalities are identified. IMPRESSION: Right lower lung atelectasis versus airspace disease. No evidence of pneumothorax. Electronically Signed   By: Harmon Pier M.D.   On: 07/28/2015 18:32   Dg Chest Portable 1 View  07/28/2015  CLINICAL DATA:  Fall EXAM: PORTABLE CHEST 1 VIEW COMPARISON:  05/18/2014 chest radiograph. FINDINGS: A portion of the costophrenic angles is excluded from the image. Stable cardiomediastinal silhouette with normal heart size. There is a nonspecific lucency at the right lung base, cannot exclude a small basilar right pneumothorax. No left pneumothorax. No pleural effusion. Hyperinflated lungs. Emphysema. No pulmonary edema. Mild curvilinear opacities at the right lung base. Stable small granuloma in the left mid lung. No displaced fractures in the visualized chest, noting limited visualization of the right lower ribs due to overlying wires. IMPRESSION: 1. Nonspecific lucency at the right lung base, cannot exclude a small basilar right pneumothorax. Consider further evaluation with repeat upright PA and lateral chest radiographs and/or chest CT as clinically warranted . 2. Hyperinflated lungs and emphysema, suggesting COPD. Mild curvilinear opacity at the right lung base, probably atelectasis or scarring. These results were called by telephone at the time of interpretation on 07/28/2015 at 5:55 pm to Dr. Bary Castilla , who verbally acknowledged these results. Electronically Signed   By: Delbert Phenix M.D.   On: 07/28/2015 17:56   Dg Cerv Spine Flex&ext Only  07/31/2015  CLINICAL DATA:  Trauma, clearance films EXAM: CERVICAL SPINE - FLEXION AND EXTENSION VIEWS ONLY COMPARISON:  CT cervical spine 07/28/2015 FINDINGS: Prevertebral soft tissues normal thickness. Vertebral body heights maintained. Scattered disc space narrowing greatest at C5-C6 and C6-C7 where endplate spurs are identified. No acute fracture or subluxation. Little collection is demonstrated. No abnormal motion is  seen with demonstrated flexion or extension. IMPRESSION: Degenerative disc disease changes of the cervical spine as above. No abnormal motion identified with flexion or extension. Electronically Signed   By: Ulyses Southward M.D.   On: 07/31/2015 13:05   Ct Maxillofacial Wo Cm  07/28/2015  CLINICAL DATA:  Fall. EXAM: CT HEAD WITHOUT CONTRAST CT MAXILLOFACIAL WITHOUT CONTRAST CT CERVICAL SPINE WITHOUT CONTRAST TECHNIQUE: Multidetector CT imaging of the head, cervical spine, and maxillofacial structures were performed using the standard protocol without intravenous contrast. Multiplanar CT image reconstructions of the cervical spine and maxillofacial structures were also generated. COMPARISON:  CT scan of head of February 09, 2013. FINDINGS: CT HEAD FINDINGS Mildly depressed and comminuted frontal skull fracture is noted. Mild bilateral frontal subarachnoid hemorrhage is noted. Mild diffuse cortical atrophy is noted. Mild chronic ischemic white matter disease is noted. Ventricular size is within normal limits. No midline shift is noted. Possible 1 cm left frontal intraparenchymal hemorrhage is noted. CT MAXILLOFACIAL FINDINGS The mandible appears normal moderately depressed left nasal bone fracture is noted. Mildly displaced left zygomatic arch fracture is noted, with mildly displaced left posterior maxillary wall fracture. Left pterygoid plate appears normal. Moderately displaced and comminuted fracture is seen involving right pterygoid plate with associated fracture of the posterior wall of the  right maxillary sinus. Minimally displaced fracture is seen involving the right lamina papyracea. Moderately displaced right anterior maxillary wall fracture is noted. Hemorrhage is noted in both maxillary sinuses as well as probably in the ethmoid and sphenoid sinuses. Fractures are seen involving the lateral orbital walls bilaterally. Mildly displaced fractures are seen involving the superior orbital roofs bilaterally. Mildly  displaced right orbital floor fracture is noted. Large amount of soft tissue gas is seen in the soft tissues lateral to the right maxillary sinus. CT CERVICAL SPINE FINDINGS No significant spondylolisthesis is noted. Mildly displaced fracture is seen involving the posterior spinous process of C5. Mild degenerative disc disease is noted at C3-4, C5-6 and C6-7. Visualized lung apices are unremarkable. IMPRESSION: Mild diffuse cortical atrophy. Mild chronic ischemic white matter disease. Mild bilateral frontal subarachnoid hemorrhage is noted. Probable 1 cm left frontal intraparenchymal hemorrhage is noted. No midline shift is noted. Small amount of pneumocephalus is noted. Mildly depressed and comminuted bilateral frontal skull fracture is noted. Moderately depressed left nasal bone fracture. Mildly displaced left zygomatic arch fracture. Mildly displaced left posterior maxillary wall fracture. Moderately displaced and comminuted fracture is seen involving the posterior wall of the right maxillary sinus and the right pterygoid plate. Hemorrhage is noted in both maxillary, ethmoid and sphenoid sinuses. Minimally displaced fracture seen involving the right lamina papyracea. Moderately displaced right anterior maxillary wall fracture is noted. Mildly displaced fractures are seen involving the superior orbital roofs bilaterally. Mildly displaced right orbital floor fracture is noted. Mildly displaced bilateral lateral orbital wall fractures. Mildly displaced fracture is seen involving posterior spinous process of C5. Multilevel degenerative disc disease is noted in the cervical spine. No significant spondylolisthesis is noted. Critical Value/emergent results were called by telephone at the time of interpretation on 07/28/2015 at 5:43 pm to Dr. Bary Castilla , who verbally acknowledged these results. Electronically Signed   By: Lupita Raider, M.D.   On: 07/28/2015 17:44     CBC  Recent Labs Lab 08/02/15 1433  08/20/2015 1625 08/09/15 0342  WBC 14.8* 17.3* 18.2*  HGB 10.8* 12.3* 11.4*  HCT 32.3* 38.4* 35.8*  PLT 246 407* 381  MCV 92.8 93.2 94.0  MCH 31.0 29.9 29.9  MCHC 33.4 32.0 31.8  RDW 14.8 14.5 14.5  LYMPHSABS 2.2 3.2  --   MONOABS 1.8* 0.8  --   EOSABS 0.0 0.1  --   BASOSABS 0.0 0.0  --     Chemistries   Recent Labs Lab 08/02/15 1433 08/11/2015 1625 08/09/15 0342  NA 142 146* 144  K 3.4* 4.1 3.6  CL 106 107 107  CO2 27 25 24   GLUCOSE 109* 117* 136*  BUN 28* 43* 40*  CREATININE 1.02 1.45* 1.16  CALCIUM 8.8* 9.0 8.6*  AST  --  35  --   ALT  --  46  --   ALKPHOS  --  80  --   BILITOT  --  1.5*  --    ------------------------------------------------------------------------------------------------------------------ estimated creatinine clearance is 39.7 mL/min (by C-G formula based on Cr of 1.16). ------------------------------------------------------------------------------------------------------------------ No results for input(s): HGBA1C in the last 72 hours. ------------------------------------------------------------------------------------------------------------------ No results for input(s): CHOL, HDL, LDLCALC, TRIG, CHOLHDL, LDLDIRECT in the last 72 hours. ------------------------------------------------------------------------------------------------------------------ No results for input(s): TSH, T4TOTAL, T3FREE, THYROIDAB in the last 72 hours.  Invalid input(s): FREET3 ------------------------------------------------------------------------------------------------------------------ No results for input(s): VITAMINB12, FOLATE, FERRITIN, TIBC, IRON, RETICCTPCT in the last 72 hours.  Coagulation profile  Recent Labs Lab 08/14/2015 1625  INR 1.39  No results for input(s): DDIMER in the last 72 hours.  Cardiac Enzymes No results for input(s): CKMB, TROPONINI, MYOGLOBIN in the last 168 hours.  Invalid input(s):  CK ------------------------------------------------------------------------------------------------------------------ Invalid input(s): POCBNP   CBG: No results for input(s): GLUCAP in the last 168 hours.     Studies: Ct Head Wo Contrast  08/09/2015  CLINICAL DATA:  Intracranial hemorrhage EXAM: CT HEAD WITHOUT CONTRAST TECHNIQUE: Contiguous axial images were obtained from the base of the skull through the vertex without intravenous contrast. COMPARISON:  Aug 16, 2015 at 16:28 FINDINGS: There is no interval change in the left frontal intraparenchymal hemorrhage. The continues to be a small volume subarachnoid blood within sulci. There is developing left frontal encephalomalacia in the area of hemorrhagic contusion. There is no interval change and the intraventricular blood, visible in both occipital horns. There is no interval change in the right subdural hygroma, measuring approximately 8 mm and with mild mass effect on the sulci and right lateral ventricle although no midline shift. Basal cisterns remain patent. Generalized atrophy and chronic white matter hypodensity persist without significant interval change. Bifrontal fractures extending down the lateral orbits and across the maxillary sinuses, unchanged. Blood levels again noted in the maxillary and sphenoid sinuses. Mastoids and middle ears appear clear. IMPRESSION: No interval change in the left frontal intraparenchymal hemorrhage with encephalomalacia. Unchanged small volume subarachnoid and intraventricular hemorrhage. Unchanged right subdural hygroma without midline shift. Electronically Signed   By: Ellery Plunk M.D.   On: 08/09/2015 02:23   Ct Head Wo Contrast  08/16/15  CLINICAL DATA:  82 year old male recently treated for UTI and pneumonia, discharged 08/03/2015. Increased lethargy in worsening altered mental status. Initial encounter. EXAM: CT HEAD WITHOUT CONTRAST TECHNIQUE: Contiguous axial images were obtained from the base  of the skull through the vertex without intravenous contrast. COMPARISON:  Report of noncontrast head CT 07/28/2015 (no images available). FINDINGS: Comminuted skull fractures and intracranial hemorrhage were described in March. Non healed comminuted bilateral frontal bone skull fractures persist. Associated bilateral skullbase fractures including at the maxillary sinuses. Hemorrhage a levels in the maxillary and sphenoid sinuses. Tympanic cavities and mastoids remain clear. No acute orbit or scalp soft tissue finding identified. Left frontal parenchymal hemorrhage was described in March. Residual hyperdense blood products along the anterior left frontal lobe ir noted in addition to an area of developing cortical encephalomalacia. There is a new right side extra-axial low density collection measuring up to 8 mm in thickness. There is mild mass effect on the right hemisphere, but no associated midline shift. There is a small volume of bilateral occipital horn intraventricular hemorrhage - subarachnoid hemorrhage was described in March. Basilar cisterns remain patent. No ventriculomegaly. Periventricular and other patchy white matter hypodensity. No acute cortically based infarct identified. Calcified atherosclerosis at the skull base. IMPRESSION: 1. Sequelae of recent severe head trauma described on the 07/28/2015 head CT (no images available at this time). 2. New low-density right Subdural Hematoma versus Subdural Hygroma measuring 8 mm in thickness. However, there is no associated midline shift. 3. Left anterior frontal lobe parenchymal hemorrhage with small volume subarachnoid and intraventricular hemorrhage appears to be nonacute. 4. Un healed comminuted bilateral frontal bone and skullbase fractures with hemorrhage layering in the paranasal sinuses. #2 discussed by telephone with Dr. Linwood Dibbles on 08-16-2015 at 17:08 . Electronically Signed   By: Odessa Fleming M.D.   On: 16-Aug-2015 17:15   Dg Chest Portable 1  View  2015/08/16  CLINICAL DATA:  Altered mental status EXAM: PORTABLE  CHEST 1 VIEW COMPARISON:  07/28/2015 FINDINGS: The lungs are hyperinflated likely secondary to COPD. There is no focal parenchymal opacity. There is no pleural effusion or pneumothorax. The heart and mediastinal contours are unremarkable. The osseous structures are unremarkable. IMPRESSION: No active disease. Electronically Signed   By: Elige Ko   On: 2015-08-21 19:44      No results found for: HGBA1C Lab Results  Component Value Date   CREATININE 1.16 08/09/2015       Scheduled Meds: . antiseptic oral rinse  7 mL Mouth Rinse BID  . furosemide  40 mg Intravenous Once  . piperacillin-tazobactam (ZOSYN)  IV  3.375 g Intravenous 3 times per day  . vancomycin  1,000 mg Intravenous Q24H   Continuous Infusions: . diltiazem (CARDIZEM) infusion 5 mg/hr (08/09/15 1000)    Principal Problem:   Altered mental status Active Problems:   Atrial fibrillation with RVR (HCC)   COPD (chronic obstructive pulmonary disease) (HCC)   Weakness generalized   Leukocytosis   Facial trauma after recent fall   Subarachnoid hemorrhage (HCC) recent after fall   Cheyne-Stokes respiration   Pressure ulcer    Time spent: 45 minutes   [facility]  [facility] Pager [phone]. If 7PM-7AM, please contact night-coverage at [email or web address], password [facility] 08/09/2015, 12:46 PM  LOS: 1 day

## 2015-08-10 ENCOUNTER — Inpatient Hospital Stay (HOSPITAL_COMMUNITY): Payer: Medicare Other

## 2015-08-10 DIAGNOSIS — J80 Acute respiratory distress syndrome: Secondary | ICD-10-CM

## 2015-08-10 DIAGNOSIS — Z7189 Other specified counseling: Secondary | ICD-10-CM

## 2015-08-10 DIAGNOSIS — R410 Disorientation, unspecified: Secondary | ICD-10-CM | POA: Insufficient documentation

## 2015-08-10 DIAGNOSIS — R0600 Dyspnea, unspecified: Secondary | ICD-10-CM | POA: Insufficient documentation

## 2015-08-10 DIAGNOSIS — Z66 Do not resuscitate: Secondary | ICD-10-CM | POA: Insufficient documentation

## 2015-08-10 DIAGNOSIS — Z515 Encounter for palliative care: Secondary | ICD-10-CM | POA: Insufficient documentation

## 2015-08-10 DIAGNOSIS — I4891 Unspecified atrial fibrillation: Secondary | ICD-10-CM | POA: Insufficient documentation

## 2015-08-10 LAB — ECHOCARDIOGRAM LIMITED
Height: 72 in
Weight: 2091.72 oz

## 2015-08-10 LAB — HEMOGLOBIN A1C
Hgb A1c MFr Bld: 6.1 % — ABNORMAL HIGH (ref 4.8–5.6)
MEAN PLASMA GLUCOSE: 128 mg/dL

## 2015-08-10 LAB — COMPREHENSIVE METABOLIC PANEL
ALT: 36 U/L (ref 17–63)
ANION GAP: 16 — AB (ref 5–15)
AST: 29 U/L (ref 15–41)
Albumin: 2.9 g/dL — ABNORMAL LOW (ref 3.5–5.0)
Alkaline Phosphatase: 80 U/L (ref 38–126)
BUN: 32 mg/dL — ABNORMAL HIGH (ref 6–20)
CHLORIDE: 99 mmol/L — AB (ref 101–111)
CO2: 28 mmol/L (ref 22–32)
CREATININE: 1.25 mg/dL — AB (ref 0.61–1.24)
Calcium: 9.1 mg/dL (ref 8.9–10.3)
GFR calc non Af Amer: 50 mL/min — ABNORMAL LOW (ref >60)
GFR, EST AFRICAN AMERICAN: 58 mL/min — AB (ref >60)
Glucose, Bld: 132 mg/dL — ABNORMAL HIGH (ref 65–99)
POTASSIUM: 3.2 mmol/L — AB (ref 3.5–5.1)
SODIUM: 143 mmol/L (ref 135–145)
Total Bilirubin: 1.3 mg/dL — ABNORMAL HIGH (ref 0.3–1.2)
Total Protein: 7.3 g/dL (ref 6.5–8.1)

## 2015-08-10 LAB — URINE CULTURE: Culture: NO GROWTH

## 2015-08-10 LAB — CBC
HCT: 39.5 % (ref 39.0–52.0)
Hemoglobin: 12.6 g/dL — ABNORMAL LOW (ref 13.0–17.0)
MCH: 29.9 pg (ref 26.0–34.0)
MCHC: 31.9 g/dL (ref 30.0–36.0)
MCV: 93.8 fL (ref 78.0–100.0)
PLATELETS: 463 10*3/uL — AB (ref 150–400)
RBC: 4.21 MIL/uL — AB (ref 4.22–5.81)
RDW: 14.3 % (ref 11.5–15.5)
WBC: 20.2 10*3/uL — AB (ref 4.0–10.5)

## 2015-08-10 LAB — MAGNESIUM: Magnesium: 1.9 mg/dL (ref 1.7–2.4)

## 2015-08-10 MED ORDER — POTASSIUM CHLORIDE 10 MEQ/100ML IV SOLN
10.0000 meq | Freq: Once | INTRAVENOUS | Status: AC
  Administered 2015-08-10: 10 meq via INTRAVENOUS

## 2015-08-10 MED ORDER — HALOPERIDOL LACTATE 5 MG/ML IJ SOLN: 0.5000 mg | Freq: Four times a day (QID) | INTRAMUSCULAR | Status: DC | PRN

## 2015-08-10 MED ORDER — POTASSIUM CHLORIDE CRYS ER 20 MEQ PO TBCR: 40.0000 meq | EXTENDED_RELEASE_TABLET | Freq: Once | ORAL | Status: DC

## 2015-08-10 MED ORDER — MAGNESIUM SULFATE IN D5W 10-5 MG/ML-% IV SOLN
1.0000 g | Freq: Once | INTRAVENOUS | Status: AC
  Administered 2015-08-10: 1 g via INTRAVENOUS
  Filled 2015-08-10: qty 100

## 2015-08-10 MED ORDER — FUROSEMIDE 10 MG/ML IJ SOLN
60.0000 mg | Freq: Once | INTRAMUSCULAR | Status: AC
  Administered 2015-08-10: 60 mg via INTRAVENOUS
  Filled 2015-08-10: qty 6

## 2015-08-10 MED ORDER — FENTANYL CITRATE (PF) 100 MCG/2ML IJ SOLN
12.5000 ug | INTRAMUSCULAR | Status: DC | PRN
  Administered 2015-08-10 – 2015-08-12 (×6): 12.5 ug via INTRAVENOUS
  Filled 2015-08-10 (×7): qty 2

## 2015-08-10 MED ORDER — POTASSIUM CHLORIDE 10 MEQ/100ML IV SOLN
10.0000 meq | INTRAVENOUS | Status: AC
  Administered 2015-08-10 (×3): 10 meq via INTRAVENOUS
  Filled 2015-08-10: qty 100

## 2015-08-10 MED ORDER — CHLORHEXIDINE GLUCONATE 0.12 % MT SOLN
15.0000 mL | Freq: Two times a day (BID) | OROMUCOSAL | Status: DC
  Administered 2015-08-10 – 2015-08-12 (×5): 15 mL via OROMUCOSAL
  Filled 2015-08-10 (×5): qty 15

## 2015-08-10 MED ORDER — LORAZEPAM 2 MG/ML IJ SOLN: 0.2500 mg | INTRAMUSCULAR | Status: DC | PRN

## 2015-08-10 MED ORDER — HALOPERIDOL LACTATE 5 MG/ML IJ SOLN
0.5000 mg | Freq: Four times a day (QID) | INTRAMUSCULAR | Status: DC | PRN
  Administered 2015-08-10: 0.5 mg via INTRAVENOUS
  Filled 2015-08-10: qty 1

## 2015-08-10 NOTE — Consult Note (Signed)
Consultation Note Date: 08/10/2015   Patient Name: Kyle [name]  DOB: Jun 19, 1926  MRN: [id number]  Age / Sex: 82 y.o., male  PCP: Kyle Ran, MD Referring Physician: Richarda Overlie, MD  Reason for Consultation: Establishing goals of care  82 yo male with Afib (anticoag d/c'd), COPD, OA, torn right rotator cuff, who has had a chronic foley cath in place since 01/2015, was admitted with a fall, multiple facial fractures and a subarachnoid hemorrhage on 3/31.  He was discharged to [facility] on 4/6.   He was readmitted 4/11 with AMS, new small SDH, and HCAP.  CTA Chest revealed multiple PEs with right sided heart strain, left lobe collapse, and right sided aspiration pna.  He is unable to take anticoagulation.  Further he has been tachycardic requiring a diltiazem drip.  Of note, the patient's chronic Foley catheter is managed by Dr. 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 According to his son he is a very strong willed conservative man. His wife Kyle [name] worked in [employer] for 22 years and retired as the Engineer, materials. [name] is extremely frail. He has a torn rotator cuff in his right shoulder that he decided not to have repaired. This causes him a great deal of pain. He also had bilateral hip replacements which remain painful as well. He has always been thin but his by mouth intake recently has been poor. According to his son Kyle [name] the patient was having hallucinations prior to discharge from the hospital and these hallucinations have continued at [facility].  Son Kyle [name] is extremely reasonable and respects the doctor's opinion. He has elected to make his father a DO NOT RESUSCITATE. I was very frank with him and explained that his father's condition is very tenuous and he may not survive this hospitalization. This news was very difficult for Kyle [name] to hear. He tells me this is the  first time he has had to deal with the potential death of a loved one.  He further explains that his daughter died at 35 years old from a brain tumor and that he was unable to face that. At several points during our conversation Kyle [name] broke down and cried. In an attempt to prepare him for what was likely to come I told him that if his father did not improve we may be discussing full comfort care soon. I introduced the concept of residential hospice care.  Contacts/Participants in Discussion: Primarily Kyle [name] and myself, his mother was also present but has advanced Alzheimer's Primary Decision Maker: Kyle [name] Relationship to Patient son   SUMMARY OF RECOMMENDATIONS DNR/DNI Continue full treatment for now Will trial very low-dose fentanyl and haldol for pain and anxiety. The patient is very sensitive to medications Please leave Foley catheter in place even at discharge Please be extremely careful with patient's right shoulder as his torn rotator cuff is very painful  Code Status/Advance Care Planning: DNR  Other Directives:None   Palliative Prophylaxis:   Aspiration, Bowel Regimen, Delirium Protocol and Frequent Pain Assessment  Psycho-social/Spiritual:  Support System: Adequate Desire for further Chaplaincy support:yes Additional Recommendations: Caregiving  Support/Resources  Prognosis: Unable to determine  Discharge Planning: Poor prognosis. Hospital death is certainly possible. May discharge to hospice   Chief Complaint/ Primary Diagnoses: Present on Admission:  . Atrial fibrillation with RVR (HCC) . Altered mental status . Leukocytosis . Facial trauma after recent fall . COPD (chronic obstructive pulmonary disease) (HCC) . Cheyne-Stokes respiration  I have reviewed the medical record,  interviewed the patient and family, and examined the patient. The following aspects are pertinent.  Past Medical History  Diagnosis Date  . PAF (paroxysmal atrial fibrillation) (HCC)       Managed with rate control and coumadin  . COPD (chronic obstructive pulmonary disease) (HCC)   . HTN (hypertension)   . Hyperlipemia   . OA (osteoarthritis)   . Chronic anticoagulation   . Incomplete rotator cuff tear or rupture of right shoulder, not specified as traumatic 2012  . Chronic anticoagulation   . Nocturia   . Osteoarthritis of right hip 03/31/2012  . Shortness of breath   . Asthma    Social History   Social History  . Marital Status: Married    Spouse Name: N/A  . Number of Children: N/A  . Years of Education: N/A   Occupational History  . retired    Social History Main Topics  . Smoking status: Current Some Day Smoker -- 44 years    Types: Pipe  . Smokeless tobacco: Never Used     Comment: Smoked cigarettes early age socially  . Alcohol Use: No  . Drug Use: No  . Sexual Activity: No   Other Topics Concern  . None   Social History Narrative   Family History  Problem Relation Age of Onset  . Cancer Father   . Cancer Mother   . Other Brother     SUICIDE   Scheduled Meds: . antiseptic oral rinse  7 mL Mouth Rinse BID  . magnesium sulfate 1 - 4 g bolus IVPB  1 g Intravenous Once  . piperacillin-tazobactam (ZOSYN)  IV  3.375 g Intravenous 3 times per day  . potassium chloride  10 mEq Intravenous Q1 Hr x 4  . vancomycin  1,000 mg Intravenous Q24H   Continuous Infusions: . diltiazem (CARDIZEM) infusion 7.5 mg/hr (08/10/15 0554)   PRN Meds:.acetaminophen **OR** acetaminophen, fentaNYL (SUBLIMAZE) injection, ondansetron **OR** ondansetron (ZOFRAN) IV Medications Prior to Admission:  Prior to Admission medications   Medication Sig Start Date End Date Taking? Authorizing Provider  alfuzosin (UROXATRAL) 10 MG 24 hr tablet Take 10 mg by mouth 2 (two) times daily. 05/16/15  Yes Historical Provider, MD  atorvastatin (LIPITOR) 10 MG tablet Take 10 mg by mouth daily.     Yes Historical Provider, MD  digoxin (LANOXIN) 0.125 MG tablet Take 125 mcg by mouth  daily. 06/13/15  Yes Historical Provider, MD  feeding supplement, ENSURE COMPLETE, (ENSURE COMPLETE) LIQD Take 237 mLs by mouth 2 (two) times daily between meals. Patient taking differently: Take 237 mLs by mouth daily.  05/20/14  Yes Leroy SeaPrashant K Singh, MD  Sennosides (SENEXON PO) Take 1 tablet by mouth daily as needed (constipation).   Yes Historical Provider, MD  tiotropium (SPIRIVA) 18 MCG inhalation capsule Place 18 mcg into inhaler and inhale daily.   Yes Historical Provider, MD  triamterene-hydrochlorothiazide (MAXZIDE-25) 37.5-25 MG per tablet Take 0.5 tablets by mouth daily. 12/19/14  Yes Historical Provider, MD   Allergies  Allergen Reactions  . Advair Diskus [Fluticasone-Salmeterol] Other (See Comments)    Unknown reaction per son  . Dilaudid [Hydromorphone Hcl] Other (See Comments)    Makes patient very loopy and disoriented  . Morphine And Related Other (See Comments)    Loopy and disoriented    Review of Systems:  unable  Physical Exam  Extremely frail cachectic man, lying with a facemask in place. Very hard of hearing Multiple bruises on his face, he speaks in and inaudible whisper Heart  rate is irregular Respiratory he has increased work of breathing, Ventimask in place receiving 14 L/m Abdomen is thin nontender nondistended   Vital Signs: BP 118/80 mmHg  Pulse 91  Temp(Src) 97.5 F (36.4 C) (Axillary)  Resp 23  Ht 6' (1.829 m)  Wt 59.3 kg (130 lb 11.7 oz)  BMI 17.73 kg/m2  SpO2 100%  SpO2: SpO2: 100 % O2 Device:SpO2: 100 % O2 Flow Rate: .O2 Flow Rate (L/min): 14 L/min  IO: Intake/output summary:  Intake/Output Summary (Last 24 hours) at 08/10/15 1012 Last data filed at 08/10/15 0800  Gross per 24 hour  Intake 340.46 ml  Output   2550 ml  Net -2209.54 ml    LBM: Last BM Date: 2015/08/30 Baseline Weight: Weight: 61.4 kg (135 lb 5.8 oz) Most recent weight: Weight: 59.3 kg (130 lb 11.7 oz)      Palliative Assessment/Data:  Flowsheet Rows        Most  Recent Value   Intake Tab    Referral Department  Hospitalist   Unit at Time of Referral  Intermediate Care Unit   Palliative Care Primary Diagnosis  Other (Comment)   Date Notified  08/09/15   Palliative Care Type  New Palliative care   Reason for referral  Clarify Goals of Care, Non-pain Symptom   Date of Admission  2015/08/30   Date first seen by Palliative Care  08/10/15   # of days Palliative referral response time  1 Day(s)   # of days IP prior to Palliative referral  1   Clinical Assessment    Palliative Performance Scale Score  30%   Pain Max last 24 hours  6   Pain Min Last 24 hours  4   Dyspnea Max Last 24 Hours  9   Dyspnea Min Last 24 hours  2   Psychosocial & Spiritual Assessment    Palliative Care Outcomes    Patient/Family meeting held?  Yes   Who was at the meeting?  son Kyle [name], patient   Palliative Care Outcomes  Completed durable DNR, Improved non-pain symptom therapy   Patient/Family wishes: Interventions discontinued/not started   Mechanical Ventilation      Additional Data Reviewed:  CBC:    Component Value Date/Time   WBC 20.2* 08/10/2015 0412   HGB 12.6* 08/10/2015 0412   HCT 39.5 08/10/2015 0412   PLT 463* 08/10/2015 0412   MCV 93.8 08/10/2015 0412   NEUTROABS 13.2* 08/30/15 1625   LYMPHSABS 3.2 08-30-15 1625   MONOABS 0.8 2015-08-30 1625   EOSABS 0.1 08/30/2015 1625   BASOSABS 0.0 08/30/15 1625   Comprehensive Metabolic Panel:    Component Value Date/Time   NA 143 08/10/2015 0412   K 3.2* 08/10/2015 0412   CL 99* 08/10/2015 0412   CO2 28 08/10/2015 0412   BUN 32* 08/10/2015 0412   CREATININE 1.25* 08/10/2015 0412   GLUCOSE 132* 08/10/2015 0412   CALCIUM 9.1 08/10/2015 0412   AST 29 08/10/2015 0412   ALT 36 08/10/2015 0412   ALKPHOS 80 08/10/2015 0412   BILITOT 1.3* 08/10/2015 0412   PROT 7.3 08/10/2015 0412   ALBUMIN 2.9* 08/10/2015 0412     Time In: 1:00 Time Out: 2:45 Time Total: 105 min Greater than 50%  of this time  was spent counseling and coordinating care related to the above assessment and plan.  Signed by:  Stephani Police, PA-C  08/10/2015, 10:12 AM  Please contact Palliative Medicine Team phone at [phone] for questions and concerns.

## 2015-08-10 NOTE — Progress Notes (Signed)
  Echocardiogram 2D Echocardiogram has been performed.  Kyle [name], Kyle [name] 08/10/2015, 4:06 PM

## 2015-08-10 NOTE — Progress Notes (Signed)
SLP Cancellation Note  Patient Details Name: Kyle EdouardMyrl [name] MRN: [id number] DOB: 09/19/1926   Cancelled treatment:       Reason Eval/Treat Not Completed: Medical issues which prohibited therapy. Pt not yet ready for swallow evaluation. Discussed with RN. Will f/u on next date to assess readiness.    Maxcine HamLaura Paiewonsky, M.A. CCC-SLP [phone]  Maxcine Hamaiewonsky, [name] 08/10/2015, 10:46 AM

## 2015-08-10 NOTE — Progress Notes (Addendum)
[facility] PROGRESS NOTE  Kyle [name] ZOX:[id number] DOB: 01/04/1927 DOA: 08/20/15 PCP: [name], MD  Length of stay: 2   Assessment/Plan: Principal Problem:   Altered mental status Active Problems:   Atrial fibrillation with RVR (HCC)   COPD (chronic obstructive pulmonary disease) (HCC)   Weakness generalized   Leukocytosis   Facial trauma after recent fall   Subarachnoid hemorrhage (HCC) recent after fall   Cheyne-Stokes respiration   Pressure ulcer   Atrial fibrillation (HCC)     Brief summary Kyle [name] is a 82 y.o. male with hx of HTN, afib, COPD. He was here 2 weeks ago for 7d hosp stay after mechanical fall with multiple facial fractures R face and L frontal lobe SAH w some IV bleeding. There was no shift and MS was stable, as below NSurg rec'd conserviative management and pt was dc'd to SNF ([facility]). Yesterday family was visitng and noticed some decrease in MS. Today per SNF staff MS was worse again so sent to ED. Here pt is very weak, whispering, disoriented. Creat / Na up slightly, has rhonchorous cough/ congestion. CXR not available due to IT issues. WBC up 17k.  Assessment and plan  Acute toxic/metabolic encephalopathy - recent fall w SAH, today's CT shows new subdural hematoma but no shift and is not large.   PNA versus secondary to aspiration. Initial chest x-ray negative but CT chest confirms PNA .  Marland Kitchen Repeat chest x-ray. Continue empiric broad-spectrum antibiotics for healthcare associated pneumonia. empiric for PNA, follow blood cultures closely. Transfer to  Telemetry   New subdural hematoma - CT of the head done due to recent subdural hematoma secondary to head trauma on 07/28/15. Patient found to have new low-density right subdural hematoma versus subdural hygroma measuring 8 mm. There is no midline shift. Given acute change in mental status over the last couple of days.   MRI of the brain to rule out CVA pending . If no improvement in the  next 24 hours. Strongly recommend palliative care consult. Patient off Coumadin after last admission. Stroke is certainly a possibility as the patient has been taken off anticoagulation.  Acute hypoxemic respiratory failure-likely secondary to aspiration, continue broad-spectrum antibiotics, speech therapy evaluation, oxygen per protocol. Aspiration precautions. Pulmonary embolism is a possibility however the patient. CT chest showed  Multiple segmental and subsegmental pulmonary emboliComplete collapse of the left lower lobe, and patchy airspace opacity within the right lower lobe, appear to reflect aspiration pneumonia,  Not a candidate for anticoagulation at this time given recent SAH   Afib - vent rate 112, continue IV dilt for now as the patient unable to swallow, target heart rate less than 120. Off anticoagulation. Therefore high risk for stroke.  HTN -stable  Recent SAH/ IV bleed - after mech fall, rx'd conservatively, coumadin dc'd during last admission  Facial trauma - also rx'd conservatively   COPD by hx  Hypernatremia -   improving  Mild acute renal insuff -IV fluids discontinued given shortness of breath,   DVT prophylaxsis SCDs  Code Status:      Code Status Orders        Start     Ordered   20-Aug-2015 2153  Full code   Continuous     August 20, 2015 2153    Code Status History      Family Communication: Discussed in detail with the patient, all imaging results, lab results explained to the patient   Disposition Plan: Anticipate patient will need  to transition to hospice care soon, palliative care consultation for  goals of care, Discussed with son [name] and updated him about patients decline during the course of the day, he has given approval to treat patient for air hunger and dyspnea with morphine/fentanyl and understands that patient may pass away tonight     Consultants:  Palliative care  Procedures:  None  Antibiotics: Anti-infectives    Start      Dose/Rate Route Frequency Ordered Stop   08/09/15 1800  vancomycin (VANCOCIN) IVPB 1000 mg/200 mL premix     1,000 mg 200 mL/hr over 60 Minutes Intravenous Every 24 hours Aug 11, 2015 1743     08/09/15 0400  piperacillin-tazobactam (ZOSYN) IVPB 3.375 g     3.375 g 12.5 mL/hr over 240 Minutes Intravenous 3 times per day Aug 11, 2015 2153     Aug 11, 2015 1745  piperacillin-tazobactam (ZOSYN) IVPB 3.375 g     3.375 g 100 mL/hr over 30 Minutes Intravenous  Once Aug 11, 2015 1739 Aug 11, 2015 1832   Aug 11, 2015 1745  vancomycin (VANCOCIN) 1,250 mg in sodium chloride 0.9 % 250 mL IVPB     1,250 mg 166.7 mL/hr over 90 Minutes Intravenous STAT Aug 11, 2015 1743 Aug 11, 2015 1952         HPI/Subjective: Confused, hypoxic, would not respond to commands,  Objective: Filed Vitals:   08/10/15 0400 08/10/15 0500 08/10/15 0600 08/10/15 0758  BP:   147/75 118/80  Pulse: 68  96 91  Temp:    97.5 F (36.4 C)  TempSrc:    Axillary  Resp: 19  21 23   Height:      Weight:  59.3 kg (130 lb 11.7 oz)    SpO2: 100%  99% 100%    Intake/Output Summary (Last 24 hours) at 08/10/15 0930 Last data filed at 08/10/15 0800  Gross per 24 hour  Intake 645.46 ml  Output   3150 ml  Net -2504.54 ml    Exam:  Gen on FM O2, eyes barely open, grips bilat hands, but very weak and whisper only verbal Cheynes-Stokes apneic breathing pattern Gurgling cough Diffuse ecchymosis R periorbital, bilat maxillary, R neck and [place] region Sclera anicteric, throat dry No jvd or bruits Chest bilat coarse rhonchi, intermittent exp wheezing during apneic phase, o/w clear Irreg irreg no MRG Abd soft schapoid, +BS no mass nontender no hsm GU normal male w foley in place draining clear urine MS no joint effusions or deformity  Data Review   Micro Results Recent Results (from the past 240 hour(s))  Culture, Urine     Status: Abnormal   Collection Time: 08/03/15 11:22 AM  Result Value Ref Range Status   Specimen Description URINE, CATHETERIZED   Final   Special Requests NONE  Final   Culture >=100,000 COLONIES/mL PSEUDOMONAS AERUGINOSA (A)  Final   Report Status 08/06/2015 FINAL  Final   Organism ID, Bacteria PSEUDOMONAS AERUGINOSA (A)  Final      Susceptibility   Pseudomonas aeruginosa - MIC*    CEFTAZIDIME 2 SENSITIVE Sensitive     CIPROFLOXACIN 2 INTERMEDIATE Intermediate     GENTAMICIN <=1 SENSITIVE Sensitive     IMIPENEM 2 SENSITIVE Sensitive     PIP/TAZO <=4 SENSITIVE Sensitive     CEFEPIME <=1 SENSITIVE Sensitive     * >=100,000 COLONIES/mL PSEUDOMONAS AERUGINOSA  Blood culture (routine x 2)     Status: None (Preliminary result)   Collection Time: Aug 11, 2015  4:25 PM  Result Value Ref Range Status   Specimen Description BLOOD LEFT ANTECUBITAL  Final  Special Requests BOTTLES DRAWN AEROBIC AND ANAEROBIC 5CC  Final   Culture NO GROWTH < 24 HOURS  Final   Report Status PENDING  Incomplete  Urine culture     Status: None (Preliminary result)   Collection Time: 08/25/2015  4:27 PM  Result Value Ref Range Status   Specimen Description URINE, CATHETERIZED  Final   Special Requests NONE  Final   Culture NO GROWTH < 24 HOURS  Final   Report Status PENDING  Incomplete  Blood culture (routine x 2)     Status: None (Preliminary result)   Collection Time: 08/24/2015  4:55 PM  Result Value Ref Range Status   Specimen Description BLOOD LEFT ANTECUBITAL  Final   Special Requests BOTTLES DRAWN AEROBIC AND ANAEROBIC 10CC  Final   Culture NO GROWTH < 24 HOURS  Final   Report Status PENDING  Incomplete  MRSA PCR Screening     Status: None   Collection Time: 08/14/2015 10:05 PM  Result Value Ref Range Status   MRSA by PCR NEGATIVE NEGATIVE Final    Comment:        The GeneXpert MRSA Assay (FDA approved for NASAL specimens only), is one component of a comprehensive MRSA colonization surveillance program. It is not intended to diagnose MRSA infection nor to guide or monitor treatment for MRSA infections.     Radiology  Reports Dg Chest 2 View  08/09/2015  CLINICAL DATA:  82 year old male with shortness of Breath. Initial encounter. EXAM: CHEST  2 VIEW COMPARISON:  For 02/2016 and earlier. FINDINGS: Semi upright AP and lateral views the chest. New complete left lower lobe collapse since March. Underlying chronic lung disease with emphysema. Questionable hilar enlargement on the lateral view, but on the frontal mediastinal contours appear stable. Visualized tracheal air column is within normal limits. Calcified aortic atherosclerosis. No acute osseous abnormality identified. IMPRESSION: New complete left lower lobe collapse since March, with questionable hilar enlargement on the lateral view. While this might be infectious, Chest CT (IV contrast preferred) is recommended to exclude an obstructing lesion. Electronically Signed   By: Odessa Fleming M.D.   On: 08/09/2015 16:35   Ct Head Wo Contrast  08/09/2015  CLINICAL DATA:  Intracranial hemorrhage EXAM: CT HEAD WITHOUT CONTRAST TECHNIQUE: Contiguous axial images were obtained from the base of the skull through the vertex without intravenous contrast. COMPARISON:  08/22/2015 at 16:28 FINDINGS: There is no interval change in the left frontal intraparenchymal hemorrhage. The continues to be a small volume subarachnoid blood within sulci. There is developing left frontal encephalomalacia in the area of hemorrhagic contusion. There is no interval change and the intraventricular blood, visible in both occipital horns. There is no interval change in the right subdural hygroma, measuring approximately 8 mm and with mild mass effect on the sulci and right lateral ventricle although no midline shift. Basal cisterns remain patent. Generalized atrophy and chronic white matter hypodensity persist without significant interval change. Bifrontal fractures extending down the lateral orbits and across the maxillary sinuses, unchanged. Blood levels again noted in the maxillary and sphenoid sinuses.  Mastoids and middle ears appear clear. IMPRESSION: No interval change in the left frontal intraparenchymal hemorrhage with encephalomalacia. Unchanged small volume subarachnoid and intraventricular hemorrhage. Unchanged right subdural hygroma without midline shift. Electronically Signed   By: Ellery Plunk M.D.   On: 08/09/2015 02:23   Ct Head Wo Contrast  07/29/2015  CLINICAL DATA:  82 year old male recently treated for UTI and pneumonia, discharged 08/03/2015. Increased lethargy in worsening  altered mental status. Initial encounter. EXAM: CT HEAD WITHOUT CONTRAST TECHNIQUE: Contiguous axial images were obtained from the base of the skull through the vertex without intravenous contrast. COMPARISON:  Report of noncontrast head CT 07/28/2015 (no images available). FINDINGS: Comminuted skull fractures and intracranial hemorrhage were described in March. Non healed comminuted bilateral frontal bone skull fractures persist. Associated bilateral skullbase fractures including at the maxillary sinuses. Hemorrhage a levels in the maxillary and sphenoid sinuses. Tympanic cavities and mastoids remain clear. No acute orbit or scalp soft tissue finding identified. Left frontal parenchymal hemorrhage was described in March. Residual hyperdense blood products along the anterior left frontal lobe ir noted in addition to an area of developing cortical encephalomalacia. There is a new right side extra-axial low density collection measuring up to 8 mm in thickness. There is mild mass effect on the right hemisphere, but no associated midline shift. There is a small volume of bilateral occipital horn intraventricular hemorrhage - subarachnoid hemorrhage was described in March. Basilar cisterns remain patent. No ventriculomegaly. Periventricular and other patchy white matter hypodensity. No acute cortically based infarct identified. Calcified atherosclerosis at the skull base. IMPRESSION: 1. Sequelae of recent severe head trauma  described on the 07/28/2015 head CT (no images available at this time). 2. New low-density right Subdural Hematoma versus Subdural Hygroma measuring 8 mm in thickness. However, there is no associated midline shift. 3. Left anterior frontal lobe parenchymal hemorrhage with small volume subarachnoid and intraventricular hemorrhage appears to be nonacute. 4. Un healed comminuted bilateral frontal bone and skullbase fractures with hemorrhage layering in the paranasal sinuses. #2 discussed by telephone with Dr. Linwood Dibbles on 08/15/2015 at 17:08 . Electronically Signed   By: Odessa Fleming M.D.   On: 08/09/2015 17:15   Ct Head Wo Contrast  07/28/2015  CLINICAL DATA:  Fall. EXAM: CT HEAD WITHOUT CONTRAST CT MAXILLOFACIAL WITHOUT CONTRAST CT CERVICAL SPINE WITHOUT CONTRAST TECHNIQUE: Multidetector CT imaging of the head, cervical spine, and maxillofacial structures were performed using the standard protocol without intravenous contrast. Multiplanar CT image reconstructions of the cervical spine and maxillofacial structures were also generated. COMPARISON:  CT scan of head of February 09, 2013. FINDINGS: CT HEAD FINDINGS Mildly depressed and comminuted frontal skull fracture is noted. Mild bilateral frontal subarachnoid hemorrhage is noted. Mild diffuse cortical atrophy is noted. Mild chronic ischemic white matter disease is noted. Ventricular size is within normal limits. No midline shift is noted. Possible 1 cm left frontal intraparenchymal hemorrhage is noted. CT MAXILLOFACIAL FINDINGS The mandible appears normal moderately depressed left nasal bone fracture is noted. Mildly displaced left zygomatic arch fracture is noted, with mildly displaced left posterior maxillary wall fracture. Left pterygoid plate appears normal. Moderately displaced and comminuted fracture is seen involving right pterygoid plate with associated fracture of the posterior wall of the right maxillary sinus. Minimally displaced fracture is seen involving the  right lamina papyracea. Moderately displaced right anterior maxillary wall fracture is noted. Hemorrhage is noted in both maxillary sinuses as well as probably in the ethmoid and sphenoid sinuses. Fractures are seen involving the lateral orbital walls bilaterally. Mildly displaced fractures are seen involving the superior orbital roofs bilaterally. Mildly displaced right orbital floor fracture is noted. Large amount of soft tissue gas is seen in the soft tissues lateral to the right maxillary sinus. CT CERVICAL SPINE FINDINGS No significant spondylolisthesis is noted. Mildly displaced fracture is seen involving the posterior spinous process of C5. Mild degenerative disc disease is noted at C3-4, C5-6 and C6-7. Visualized  lung apices are unremarkable. IMPRESSION: Mild diffuse cortical atrophy. Mild chronic ischemic white matter disease. Mild bilateral frontal subarachnoid hemorrhage is noted. Probable 1 cm left frontal intraparenchymal hemorrhage is noted. No midline shift is noted. Small amount of pneumocephalus is noted. Mildly depressed and comminuted bilateral frontal skull fracture is noted. Moderately depressed left nasal bone fracture. Mildly displaced left zygomatic arch fracture. Mildly displaced left posterior maxillary wall fracture. Moderately displaced and comminuted fracture is seen involving the posterior wall of the right maxillary sinus and the right pterygoid plate. Hemorrhage is noted in both maxillary, ethmoid and sphenoid sinuses. Minimally displaced fracture seen involving the right lamina papyracea. Moderately displaced right anterior maxillary wall fracture is noted. Mildly displaced fractures are seen involving the superior orbital roofs bilaterally. Mildly displaced right orbital floor fracture is noted. Mildly displaced bilateral lateral orbital wall fractures. Mildly displaced fracture is seen involving posterior spinous process of C5. Multilevel degenerative disc disease is noted in the  cervical spine. No significant spondylolisthesis is noted. Critical Value/emergent results were called by telephone at the time of interpretation on 07/28/2015 at 5:43 pm to Dr. Bary Castilla , who verbally acknowledged these results. Electronically Signed   By: Lupita Raider, M.D.   On: 07/28/2015 17:44   Ct Angio Chest Pe W/cm &/or Wo Cm  08/09/2015  CLINICAL DATA:  Acute onset of shortness of breath. Initial encounter. EXAM: CT ANGIOGRAPHY CHEST WITH CONTRAST TECHNIQUE: Multidetector CT imaging of the chest was performed using the standard protocol during bolus administration of intravenous contrast. Multiplanar CT image reconstructions and MIPs were obtained to evaluate the vascular anatomy. CONTRAST:  80 mL of Isovue 370 IV contrast COMPARISON:  Chest radiograph performed earlier today at 4:20 p.m., and CT of the right shoulder performed 08/01/2011 FINDINGS: Multiple segmental and subsegmental pulmonary emboli are noted within pulmonary artery branches to the right upper lobe, right lower lobe and left upper lobe. The RV/LV ratio of 1.6 corresponds to at least submassive pulmonary embolus and concern for right heart strain. There is complete collapse of the left lower lobe, which appears to reflect underlying aspiration pneumonia, as debris is noted filling the bronchi to the left lower lobe. More mild aspiration is noted within the bronchioles to the right lower lobe, and to the left upper lobe. Patchy airspace opacities noted within the right lower lobe. Underlying bilateral emphysematous change is noted. Scarring and mild calcification are noted at the lung apices. There is no evidence of pleural effusion or pneumothorax. No masses are identified; no abnormal focal contrast enhancement is seen. There is mild aneurysmal dilatation of the ascending thoracic aorta to 4.3 cm in AP dimension. The aortic arch measures 3.8 cm in diameter. Mild biatrial enlargement is noted. Diffuse coronary artery  calcifications are seen. Trace pericardial fluid remains within normal limits. No definite mediastinal lymphadenopathy is seen. No axillary lymphadenopathy is seen. The thyroid gland is unremarkable in appearance. There is reflux of contrast into the hepatic veins and IVC. Scattered calcified granulomata are noted within the spleen. The patient is status post cholecystectomy, with clips noted at the gallbladder fossa. The visualized portions of the pancreas, adrenal glands and right kidney are grossly unremarkable. A 2.2 cm left renal cyst is noted. Prominent calcification is noted along the proximal left renal artery. No acute osseous abnormalities are seen. Review of the MIP images confirms the above findings. IMPRESSION: 1. Multiple segmental and subsegmental pulmonary emboli within pulmonary artery branches to the right upper lobe, right lower lobe and  left upper lobe. CT evidence of right heart strain (RV/LV Ratio = 1.6) consistent with at least submassive (intermediate risk) PE. The presence of right heart strain has been associated with an increased risk of morbidity and mortality. However, per clinical correlation, the patient cannot tolerate blood thinners given recent subdural hemorrhage. 2. Complete collapse of the left lower lobe, and patchy airspace opacity within the right lower lobe, appear to reflect aspiration pneumonia, as debris is noted filling the bronchi to the left lower lobe, and additional debris is noted within the bronchioles to the right lower lobe and left upper lobe. 3. Underlying bilateral emphysematous change noted. Scarring and mild calcification at the lung apices. 4. Mild aneurysmal dilatation of the ascending thoracic aorta to 4.3 cm in AP dimension. This is within the normal range for the patient's age. 5. Mild biatrial enlargement noted. 6. Diffuse coronary artery calcifications seen. 7. 2.2 cm left renal cyst noted. 8. Calcification along the proximal left renal artery.  Critical Value/emergent results were called by telephone at the time of interpretation on 08/09/2015 at 6:35 pm to Dr. Richarda Overlie, who verbally acknowledged these results. Electronically Signed   By: Roanna Raider M.D.   On: 08/09/2015 18:38   Ct Cervical Spine Wo Contrast  07/28/2015  CLINICAL DATA:  Fall. EXAM: CT HEAD WITHOUT CONTRAST CT MAXILLOFACIAL WITHOUT CONTRAST CT CERVICAL SPINE WITHOUT CONTRAST TECHNIQUE: Multidetector CT imaging of the head, cervical spine, and maxillofacial structures were performed using the standard protocol without intravenous contrast. Multiplanar CT image reconstructions of the cervical spine and maxillofacial structures were also generated. COMPARISON:  CT scan of head of February 09, 2013. FINDINGS: CT HEAD FINDINGS Mildly depressed and comminuted frontal skull fracture is noted. Mild bilateral frontal subarachnoid hemorrhage is noted. Mild diffuse cortical atrophy is noted. Mild chronic ischemic white matter disease is noted. Ventricular size is within normal limits. No midline shift is noted. Possible 1 cm left frontal intraparenchymal hemorrhage is noted. CT MAXILLOFACIAL FINDINGS The mandible appears normal moderately depressed left nasal bone fracture is noted. Mildly displaced left zygomatic arch fracture is noted, with mildly displaced left posterior maxillary wall fracture. Left pterygoid plate appears normal. Moderately displaced and comminuted fracture is seen involving right pterygoid plate with associated fracture of the posterior wall of the right maxillary sinus. Minimally displaced fracture is seen involving the right lamina papyracea. Moderately displaced right anterior maxillary wall fracture is noted. Hemorrhage is noted in both maxillary sinuses as well as probably in the ethmoid and sphenoid sinuses. Fractures are seen involving the lateral orbital walls bilaterally. Mildly displaced fractures are seen involving the superior orbital roofs bilaterally.  Mildly displaced right orbital floor fracture is noted. Large amount of soft tissue gas is seen in the soft tissues lateral to the right maxillary sinus. CT CERVICAL SPINE FINDINGS No significant spondylolisthesis is noted. Mildly displaced fracture is seen involving the posterior spinous process of C5. Mild degenerative disc disease is noted at C3-4, C5-6 and C6-7. Visualized lung apices are unremarkable. IMPRESSION: Mild diffuse cortical atrophy. Mild chronic ischemic white matter disease. Mild bilateral frontal subarachnoid hemorrhage is noted. Probable 1 cm left frontal intraparenchymal hemorrhage is noted. No midline shift is noted. Small amount of pneumocephalus is noted. Mildly depressed and comminuted bilateral frontal skull fracture is noted. Moderately depressed left nasal bone fracture. Mildly displaced left zygomatic arch fracture. Mildly displaced left posterior maxillary wall fracture. Moderately displaced and comminuted fracture is seen involving the posterior wall of the right maxillary sinus and the  right pterygoid plate. Hemorrhage is noted in both maxillary, ethmoid and sphenoid sinuses. Minimally displaced fracture seen involving the right lamina papyracea. Moderately displaced right anterior maxillary wall fracture is noted. Mildly displaced fractures are seen involving the superior orbital roofs bilaterally. Mildly displaced right orbital floor fracture is noted. Mildly displaced bilateral lateral orbital wall fractures. Mildly displaced fracture is seen involving posterior spinous process of C5. Multilevel degenerative disc disease is noted in the cervical spine. No significant spondylolisthesis is noted. Critical Value/emergent results were called by telephone at the time of interpretation on 07/28/2015 at 5:43 pm to Dr. Bary Castilla , who verbally acknowledged these results. Electronically Signed   By: Lupita Raider, M.D.   On: 07/28/2015 17:44   Dg Hand 2 View Left  07/28/2015   CLINICAL DATA:  Larey Seat today, pain at MCP joints, bruising EXAM: LEFT HAND - 2 VIEW COMPARISON:  LEFT wrist radiographs 05/19/2014 FINDINGS: Diffuse osseous demineralization. Scattered degenerative changes of interphalangeal joints. Osteolysis versus resection of trapezium. Degenerative changes first and second MCP joints. No acute fracture, dislocation, or bone destruction. Soft tissue swelling at distal phalanx index finger. Corticated ossicle noted dorsal to mid carpals appear present since 2016. Additional osseous density dorsal to the distal carpal row, seen only on lateral view, was not definitely visualized previously and is indeterminate in age. IMPRESSION: Osseous demineralization with scattered degenerative changes as above. Additional calcific density is seen dorsal to the distal carpal row on lateral view new since prior exam, age-indeterminate ; correlation for pain/tenderness at this site recommended to exclude acute fracture. Electronically Signed   By: Ulyses Southward M.D.   On: 07/28/2015 18:39   Dg Chest Port 1 View  08/10/2015  CLINICAL DATA:  Acute onset of cough.  Initial encounter. EXAM: PORTABLE CHEST 1 VIEW COMPARISON:  Chest radiograph and CTA of the chest performed 08/09/2015 FINDINGS: The lungs are well-aerated. Mild left basilar airspace opacity may reflect atelectasis or possibly pneumonia. There is no evidence of pleural effusion or pneumothorax. The cardiomediastinal silhouette is borderline normal in size. No acute osseous abnormalities are seen. IMPRESSION: Mild left basilar airspace opacity may reflect atelectasis or possibly mild pneumonia. Electronically Signed   By: Roanna Raider M.D.   On: 08/10/2015 03:05   Dg Chest Portable 1 View  08/16/15  CLINICAL DATA:  Altered mental status EXAM: PORTABLE CHEST 1 VIEW COMPARISON:  07/28/2015 FINDINGS: The lungs are hyperinflated likely secondary to COPD. There is no focal parenchymal opacity. There is no pleural effusion or  pneumothorax. The heart and mediastinal contours are unremarkable. The osseous structures are unremarkable. IMPRESSION: No active disease. Electronically Signed   By: Elige Ko   On: 2015/08/16 19:44   Dg Chest Portable 1 View  07/28/2015  CLINICAL DATA:  82 year old male with fall. EXAM: PORTABLE CHEST 1 VIEW COMPARISON:  07/28/2015 and prior radiographs FINDINGS: The cardiomediastinal silhouette is unremarkable. Right lower lung atelectasis versus airspace disease noted. There is no evidence of pulmonary edema, suspicious pulmonary nodule/mass, pleural effusion, or pneumothorax. No acute bony abnormalities are identified. IMPRESSION: Right lower lung atelectasis versus airspace disease. No evidence of pneumothorax. Electronically Signed   By: Harmon Pier M.D.   On: 07/28/2015 18:32   Dg Chest Portable 1 View  07/28/2015  CLINICAL DATA:  Fall EXAM: PORTABLE CHEST 1 VIEW COMPARISON:  05/18/2014 chest radiograph. FINDINGS: A portion of the costophrenic angles is excluded from the image. Stable cardiomediastinal silhouette with normal heart size. There is a nonspecific lucency at the  right lung base, cannot exclude a small basilar right pneumothorax. No left pneumothorax. No pleural effusion. Hyperinflated lungs. Emphysema. No pulmonary edema. Mild curvilinear opacities at the right lung base. Stable small granuloma in the left mid lung. No displaced fractures in the visualized chest, noting limited visualization of the right lower ribs due to overlying wires. IMPRESSION: 1. Nonspecific lucency at the right lung base, cannot exclude a small basilar right pneumothorax. Consider further evaluation with repeat upright PA and lateral chest radiographs and/or chest CT as clinically warranted . 2. Hyperinflated lungs and emphysema, suggesting COPD. Mild curvilinear opacity at the right lung base, probably atelectasis or scarring. These results were called by telephone at the time of interpretation on 07/28/2015 at  5:55 pm to Dr. Bary Castilla , who verbally acknowledged these results. Electronically Signed   By: Delbert Phenix M.D.   On: 07/28/2015 17:56   Dg Cerv Spine Flex&ext Only  07/31/2015  CLINICAL DATA:  Trauma, clearance films EXAM: CERVICAL SPINE - FLEXION AND EXTENSION VIEWS ONLY COMPARISON:  CT cervical spine 07/28/2015 FINDINGS: Prevertebral soft tissues normal thickness. Vertebral body heights maintained. Scattered disc space narrowing greatest at C5-C6 and C6-C7 where endplate spurs are identified. No acute fracture or subluxation. Little collection is demonstrated. No abnormal motion is seen with demonstrated flexion or extension. IMPRESSION: Degenerative disc disease changes of the cervical spine as above. No abnormal motion identified with flexion or extension. Electronically Signed   By: Ulyses Southward M.D.   On: 07/31/2015 13:05   Ct Maxillofacial Wo Cm  07/28/2015  CLINICAL DATA:  Fall. EXAM: CT HEAD WITHOUT CONTRAST CT MAXILLOFACIAL WITHOUT CONTRAST CT CERVICAL SPINE WITHOUT CONTRAST TECHNIQUE: Multidetector CT imaging of the head, cervical spine, and maxillofacial structures were performed using the standard protocol without intravenous contrast. Multiplanar CT image reconstructions of the cervical spine and maxillofacial structures were also generated. COMPARISON:  CT scan of head of February 09, 2013. FINDINGS: CT HEAD FINDINGS Mildly depressed and comminuted frontal skull fracture is noted. Mild bilateral frontal subarachnoid hemorrhage is noted. Mild diffuse cortical atrophy is noted. Mild chronic ischemic white matter disease is noted. Ventricular size is within normal limits. No midline shift is noted. Possible 1 cm left frontal intraparenchymal hemorrhage is noted. CT MAXILLOFACIAL FINDINGS The mandible appears normal moderately depressed left nasal bone fracture is noted. Mildly displaced left zygomatic arch fracture is noted, with mildly displaced left posterior maxillary wall fracture. Left  pterygoid plate appears normal. Moderately displaced and comminuted fracture is seen involving right pterygoid plate with associated fracture of the posterior wall of the right maxillary sinus. Minimally displaced fracture is seen involving the right lamina papyracea. Moderately displaced right anterior maxillary wall fracture is noted. Hemorrhage is noted in both maxillary sinuses as well as probably in the ethmoid and sphenoid sinuses. Fractures are seen involving the lateral orbital walls bilaterally. Mildly displaced fractures are seen involving the superior orbital roofs bilaterally. Mildly displaced right orbital floor fracture is noted. Large amount of soft tissue gas is seen in the soft tissues lateral to the right maxillary sinus. CT CERVICAL SPINE FINDINGS No significant spondylolisthesis is noted. Mildly displaced fracture is seen involving the posterior spinous process of C5. Mild degenerative disc disease is noted at C3-4, C5-6 and C6-7. Visualized lung apices are unremarkable. IMPRESSION: Mild diffuse cortical atrophy. Mild chronic ischemic white matter disease. Mild bilateral frontal subarachnoid hemorrhage is noted. Probable 1 cm left frontal intraparenchymal hemorrhage is noted. No midline shift is noted. Small amount of pneumocephalus is  noted. Mildly depressed and comminuted bilateral frontal skull fracture is noted. Moderately depressed left nasal bone fracture. Mildly displaced left zygomatic arch fracture. Mildly displaced left posterior maxillary wall fracture. Moderately displaced and comminuted fracture is seen involving the posterior wall of the right maxillary sinus and the right pterygoid plate. Hemorrhage is noted in both maxillary, ethmoid and sphenoid sinuses. Minimally displaced fracture seen involving the right lamina papyracea. Moderately displaced right anterior maxillary wall fracture is noted. Mildly displaced fractures are seen involving the superior orbital roofs bilaterally.  Mildly displaced right orbital floor fracture is noted. Mildly displaced bilateral lateral orbital wall fractures. Mildly displaced fracture is seen involving posterior spinous process of C5. Multilevel degenerative disc disease is noted in the cervical spine. No significant spondylolisthesis is noted. Critical Value/emergent results were called by telephone at the time of interpretation on 07/28/2015 at 5:43 pm to Dr. Bary Castilla , who verbally acknowledged these results. Electronically Signed   By: Lupita Raider, M.D.   On: 07/28/2015 17:44     CBC  Recent Labs Lab 07/29/2015 1625 08/09/15 0342 08/10/15 0412  WBC 17.3* 18.2* 20.2*  HGB 12.3* 11.4* 12.6*  HCT 38.4* 35.8* 39.5  PLT 407* 381 463*  MCV 93.2 94.0 93.8  MCH 29.9 29.9 29.9  MCHC 32.0 31.8 31.9  RDW 14.5 14.5 14.3  LYMPHSABS 3.2  --   --   MONOABS 0.8  --   --   EOSABS 0.1  --   --   BASOSABS 0.0  --   --     Chemistries   Recent Labs Lab 08/12/2015 1625 08/09/15 0342 08/10/15 0412  NA 146* 144 143  K 4.1 3.6 3.2*  CL 107 107 99*  CO2 25 24 28   GLUCOSE 117* 136* 132*  BUN 43* 40* 32*  CREATININE 1.45* 1.16 1.25*  CALCIUM 9.0 8.6* 9.1  AST 35  --  29  ALT 46  --  36  ALKPHOS 80  --  80  BILITOT 1.5*  --  1.3*   ------------------------------------------------------------------------------------------------------------------ estimated creatinine clearance is 35.6 mL/min (by C-G formula based on Cr of 1.25). ------------------------------------------------------------------------------------------------------------------  Recent Labs  08/09/15 0342  HGBA1C 6.1*   ------------------------------------------------------------------------------------------------------------------ No results for input(s): CHOL, HDL, LDLCALC, TRIG, CHOLHDL, LDLDIRECT in the last 72 hours. ------------------------------------------------------------------------------------------------------------------ No results for  input(s): TSH, T4TOTAL, T3FREE, THYROIDAB in the last 72 hours.  Invalid input(s): FREET3 ------------------------------------------------------------------------------------------------------------------ No results for input(s): VITAMINB12, FOLATE, FERRITIN, TIBC, IRON, RETICCTPCT in the last 72 hours.  Coagulation profile  Recent Labs Lab 08/15/2015 1625  INR 1.39    No results for input(s): DDIMER in the last 72 hours.  Cardiac Enzymes No results for input(s): CKMB, TROPONINI, MYOGLOBIN in the last 168 hours.  Invalid input(s): CK ------------------------------------------------------------------------------------------------------------------ Invalid input(s): POCBNP   CBG: No results for input(s): GLUCAP in the last 168 hours.     Studies: Dg Chest 2 View  08/09/2015  CLINICAL DATA:  82 year old male with shortness of Breath. Initial encounter. EXAM: CHEST  2 VIEW COMPARISON:  For 02/2016 and earlier. FINDINGS: Semi upright AP and lateral views the chest. New complete left lower lobe collapse since March. Underlying chronic lung disease with emphysema. Questionable hilar enlargement on the lateral view, but on the frontal mediastinal contours appear stable. Visualized tracheal air column is within normal limits. Calcified aortic atherosclerosis. No acute osseous abnormality identified. IMPRESSION: New complete left lower lobe collapse since March, with questionable hilar enlargement on the lateral view. While this might be infectious, Chest  CT (IV contrast preferred) is recommended to exclude an obstructing lesion. Electronically Signed   By: Odessa Fleming M.D.   On: 08/09/2015 16:35   Ct Head Wo Contrast  08/09/2015  CLINICAL DATA:  Intracranial hemorrhage EXAM: CT HEAD WITHOUT CONTRAST TECHNIQUE: Contiguous axial images were obtained from the base of the skull through the vertex without intravenous contrast. COMPARISON:  08/25/2015 at 16:28 FINDINGS: There is no interval change in  the left frontal intraparenchymal hemorrhage. The continues to be a small volume subarachnoid blood within sulci. There is developing left frontal encephalomalacia in the area of hemorrhagic contusion. There is no interval change and the intraventricular blood, visible in both occipital horns. There is no interval change in the right subdural hygroma, measuring approximately 8 mm and with mild mass effect on the sulci and right lateral ventricle although no midline shift. Basal cisterns remain patent. Generalized atrophy and chronic white matter hypodensity persist without significant interval change. Bifrontal fractures extending down the lateral orbits and across the maxillary sinuses, unchanged. Blood levels again noted in the maxillary and sphenoid sinuses. Mastoids and middle ears appear clear. IMPRESSION: No interval change in the left frontal intraparenchymal hemorrhage with encephalomalacia. Unchanged small volume subarachnoid and intraventricular hemorrhage. Unchanged right subdural hygroma without midline shift. Electronically Signed   By: Ellery Plunk M.D.   On: 08/09/2015 02:23   Ct Head Wo Contrast  08/26/2015  CLINICAL DATA:  82 year old male recently treated for UTI and pneumonia, discharged 08/03/2015. Increased lethargy in worsening altered mental status. Initial encounter. EXAM: CT HEAD WITHOUT CONTRAST TECHNIQUE: Contiguous axial images were obtained from the base of the skull through the vertex without intravenous contrast. COMPARISON:  Report of noncontrast head CT 07/28/2015 (no images available). FINDINGS: Comminuted skull fractures and intracranial hemorrhage were described in March. Non healed comminuted bilateral frontal bone skull fractures persist. Associated bilateral skullbase fractures including at the maxillary sinuses. Hemorrhage a levels in the maxillary and sphenoid sinuses. Tympanic cavities and mastoids remain clear. No acute orbit or scalp soft tissue finding identified.  Left frontal parenchymal hemorrhage was described in March. Residual hyperdense blood products along the anterior left frontal lobe ir noted in addition to an area of developing cortical encephalomalacia. There is a new right side extra-axial low density collection measuring up to 8 mm in thickness. There is mild mass effect on the right hemisphere, but no associated midline shift. There is a small volume of bilateral occipital horn intraventricular hemorrhage - subarachnoid hemorrhage was described in March. Basilar cisterns remain patent. No ventriculomegaly. Periventricular and other patchy white matter hypodensity. No acute cortically based infarct identified. Calcified atherosclerosis at the skull base. IMPRESSION: 1. Sequelae of recent severe head trauma described on the 07/28/2015 head CT (no images available at this time). 2. New low-density right Subdural Hematoma versus Subdural Hygroma measuring 8 mm in thickness. However, there is no associated midline shift. 3. Left anterior frontal lobe parenchymal hemorrhage with small volume subarachnoid and intraventricular hemorrhage appears to be nonacute. 4. Un healed comminuted bilateral frontal bone and skullbase fractures with hemorrhage layering in the paranasal sinuses. #2 discussed by telephone with Dr. Linwood Dibbles on 08/23/2015 at 17:08 . Electronically Signed   By: Odessa Fleming M.D.   On: 08/09/2015 17:15   Ct Angio Chest Pe W/cm &/or Wo Cm  08/09/2015  CLINICAL DATA:  Acute onset of shortness of breath. Initial encounter. EXAM: CT ANGIOGRAPHY CHEST WITH CONTRAST TECHNIQUE: Multidetector CT imaging of the chest was performed using the standard protocol  during bolus administration of intravenous contrast. Multiplanar CT image reconstructions and MIPs were obtained to evaluate the vascular anatomy. CONTRAST:  80 mL of Isovue 370 IV contrast COMPARISON:  Chest radiograph performed earlier today at 4:20 p.m., and CT of the right shoulder performed 08/01/2011  FINDINGS: Multiple segmental and subsegmental pulmonary emboli are noted within pulmonary artery branches to the right upper lobe, right lower lobe and left upper lobe. The RV/LV ratio of 1.6 corresponds to at least submassive pulmonary embolus and concern for right heart strain. There is complete collapse of the left lower lobe, which appears to reflect underlying aspiration pneumonia, as debris is noted filling the bronchi to the left lower lobe. More mild aspiration is noted within the bronchioles to the right lower lobe, and to the left upper lobe. Patchy airspace opacities noted within the right lower lobe. Underlying bilateral emphysematous change is noted. Scarring and mild calcification are noted at the lung apices. There is no evidence of pleural effusion or pneumothorax. No masses are identified; no abnormal focal contrast enhancement is seen. There is mild aneurysmal dilatation of the ascending thoracic aorta to 4.3 cm in AP dimension. The aortic arch measures 3.8 cm in diameter. Mild biatrial enlargement is noted. Diffuse coronary artery calcifications are seen. Trace pericardial fluid remains within normal limits. No definite mediastinal lymphadenopathy is seen. No axillary lymphadenopathy is seen. The thyroid gland is unremarkable in appearance. There is reflux of contrast into the hepatic veins and IVC. Scattered calcified granulomata are noted within the spleen. The patient is status post cholecystectomy, with clips noted at the gallbladder fossa. The visualized portions of the pancreas, adrenal glands and right kidney are grossly unremarkable. A 2.2 cm left renal cyst is noted. Prominent calcification is noted along the proximal left renal artery. No acute osseous abnormalities are seen. Review of the MIP images confirms the above findings. IMPRESSION: 1. Multiple segmental and subsegmental pulmonary emboli within pulmonary artery branches to the right upper lobe, right lower lobe and left upper  lobe. CT evidence of right heart strain (RV/LV Ratio = 1.6) consistent with at least submassive (intermediate risk) PE. The presence of right heart strain has been associated with an increased risk of morbidity and mortality. However, per clinical correlation, the patient cannot tolerate blood thinners given recent subdural hemorrhage. 2. Complete collapse of the left lower lobe, and patchy airspace opacity within the right lower lobe, appear to reflect aspiration pneumonia, as debris is noted filling the bronchi to the left lower lobe, and additional debris is noted within the bronchioles to the right lower lobe and left upper lobe. 3. Underlying bilateral emphysematous change noted. Scarring and mild calcification at the lung apices. 4. Mild aneurysmal dilatation of the ascending thoracic aorta to 4.3 cm in AP dimension. This is within the normal range for the patient's age. 5. Mild biatrial enlargement noted. 6. Diffuse coronary artery calcifications seen. 7. 2.2 cm left renal cyst noted. 8. Calcification along the proximal left renal artery. Critical Value/emergent results were called by telephone at the time of interpretation on 08/09/2015 at 6:35 pm to Dr. Richarda Overlie, who verbally acknowledged these results. Electronically Signed   By: Roanna Raider M.D.   On: 08/09/2015 18:38   Dg Chest Port 1 View  08/10/2015  CLINICAL DATA:  Acute onset of cough.  Initial encounter. EXAM: PORTABLE CHEST 1 VIEW COMPARISON:  Chest radiograph and CTA of the chest performed 08/09/2015 FINDINGS: The lungs are well-aerated. Mild left basilar airspace opacity may reflect atelectasis  or possibly pneumonia. There is no evidence of pleural effusion or pneumothorax. The cardiomediastinal silhouette is borderline normal in size. No acute osseous abnormalities are seen. IMPRESSION: Mild left basilar airspace opacity may reflect atelectasis or possibly mild pneumonia. Electronically Signed   By: Roanna Raider M.D.   On: 08/10/2015  03:05   Dg Chest Portable 1 View  08/14/2015  CLINICAL DATA:  Altered mental status EXAM: PORTABLE CHEST 1 VIEW COMPARISON:  07/28/2015 FINDINGS: The lungs are hyperinflated likely secondary to COPD. There is no focal parenchymal opacity. There is no pleural effusion or pneumothorax. The heart and mediastinal contours are unremarkable. The osseous structures are unremarkable. IMPRESSION: No active disease. Electronically Signed   By: Elige Ko   On: 08/19/2015 19:44      Lab Results  Component Value Date   HGBA1C 6.1* 08/09/2015   Lab Results  Component Value Date   CREATININE 1.25* 08/10/2015       Scheduled Meds: . antiseptic oral rinse  7 mL Mouth Rinse BID  . feeding supplement  1 Container Oral TID BM  . piperacillin-tazobactam (ZOSYN)  IV  3.375 g Intravenous 3 times per day  . potassium chloride  40 mEq Oral Once  . vancomycin  1,000 mg Intravenous Q24H   Continuous Infusions: . diltiazem (CARDIZEM) infusion 7.5 mg/hr (08/10/15 0554)    Principal Problem:   Altered mental status Active Problems:   Atrial fibrillation with RVR (HCC)   COPD (chronic obstructive pulmonary disease) (HCC)   Weakness generalized   Leukocytosis   Facial trauma after recent fall   Subarachnoid hemorrhage (HCC) recent after fall   Cheyne-Stokes respiration   Pressure ulcer   Atrial fibrillation (HCC)    Time spent: 45 minutes   [facility]  [facility] Pager [phone]. If 7PM-7AM, please contact night-coverage at [email or web address], password [facility] 08/10/2015, 9:30 AM  LOS: 2 days

## 2015-08-10 NOTE — Progress Notes (Signed)
Pt respiratory status decompensated, requiring venti mask at 55%, lung sounds wet. Notified Donnamarie PoagK. Kirby, NP, orders received for 60 mg Lasix IV. Will administer and continue to monitor closely.

## 2015-08-10 NOTE — Care Management Note (Signed)
Case Management Note  Patient Details  Name: Kyle EdouardMyrl [name] MRN: [id number] Date of Birth: 09/22/1926  Subjective/Objective:     Patient is from [facility], presents with resp distress, cxr- aspirating, multiple pe, bleed, on cardizem drip, cr up and wbc up, on vent mask, plan for mri, echo, dnr.  Palliative consulted.  NCM will cont to follow for dc needs.               Action/Plan:   Expected Discharge Date:                  Expected Discharge Plan:  Skilled Nursing Facility  In-House Referral:  Clinical Social Work  Discharge planning Services  CM Consult  Post Acute Care Choice:    Choice offered to:     DME Arranged:    DME Agency:     HH Arranged:    HH Agency:     Status of Service:  In process, will continue to follow  Medicare Important Message Given:    Date Medicare IM Given:    Medicare IM give by:    Date Additional Medicare IM Given:    Additional Medicare Important Message give by:     If discussed at Long Length of Stay Meetings, dates discussed:    Additional Comments:  Leone Havenaylor, Christiane Sistare Clinton, RN 08/10/2015, 5:10 PM

## 2015-08-10 NOTE — Clinical Social Work Note (Signed)
Clinical Social Work Assessment  Patient Details  Name: Kyle EdouardMyrl [name] MRN: [id number] Date of Birth: 06/06/1926  Date of referral:  08/10/15               Reason for consult:  Discharge Planning, Facility Placement                Permission sought to share information with:  Family Supports Permission granted to share information::  Yes, Verbal Permission Granted  Name::      Kyle [name](William Westergard)  Agency::   (n/a)  Relationship::   (son, [name])  Contact Information:   [phone])  Housing/Transportation Living arrangements for the past 2 months:  Single Family Home Source of Information:  Adult Children Patient Interpreter Needed:  None Criminal Activity/Legal Involvement Pertinent to Current Situation/Hospitalization:  No - Comment as needed Significant Relationships:  Adult Children Lives with:  Adult Children Do you feel safe going back to the place where you live?  No Need for family participation in patient care:  Yes (Comment)  Care giving concerns:   Patient's son has not expressed any care giving concerns at this time.   Social Worker assessment / plan:   BSW intern has spoken with patient's son, [name] in reference to discharge disposition. Patient is from home with son and spouse, Kyle [name]. However, patient was admitted to [facility] [place] on Friday, April 7,2017 for short term rehab. Although patient's family did not express any concerns or complaints with patient's facility placement, according to patient's MD, patient to likely d/c with residential hospice once medically stable. Patient's son states that he was just informed of this plan this morning therefore, he has not thought of any possible facilities for patient at this time. BSW intern to make CSW aware. BSW intern informed patient's son that CSW to f/u with him at a later date regarding facility placement. BSW intern remains available.   Employment status:  Retired Health and safety inspectornsurance information:  Medicare PT  Recommendations:  Not assessed at this time Information / Referral to community resources:   (none at this time.)  Patient/Family's Response to care:   Patient's son appreciated Social Work intervention from [employer] Depot[employer]W intern.  Patient/Family's Understanding of and Emotional Response to Diagnosis, Current Treatment, and Prognosis:   Patient's son appeared to be a little overwhelmed of patient's sudden decline in health, causing him to be possible candidate for residential hospice.   Emotional Assessment Appearance:   (unable to assess) Attitude/Demeanor/Rapport:  Unable to Assess Affect (typically observed):  Unable to Assess Orientation:   (disoriented) Alcohol / Substance use:  Not Applicable Psych involvement (Current and /or in the community):  No (Comment)  Discharge Needs  Concerns to be addressed:  Discharge Planning Concerns Readmission within the last 30 days:  No Current discharge risk:  None Barriers to Discharge:  No Barriers Identified   Orson Gearatiana Dardan Shelton, Student-SW 08/10/2015, 4:05 PM

## 2015-08-10 NOTE — Consult Note (Signed)
   [facility]   08/10/2015  Kyle [name] 07/02/1926 [id number]   Patient [facility] potential [facility]. Chart reviewed. Noted Palliative Medicine notes. [facility] not appropriate at this time.   Raiford NobleAtika Jilda Kress, MSN-Ed, RN,BSN [facility] Care [facility] Hospital Liaison [phone]

## 2015-08-11 DIAGNOSIS — I4891 Unspecified atrial fibrillation: Secondary | ICD-10-CM

## 2015-08-11 DIAGNOSIS — S0993XA Unspecified injury of face, initial encounter: Secondary | ICD-10-CM

## 2015-08-11 DIAGNOSIS — I609 Nontraumatic subarachnoid hemorrhage, unspecified: Secondary | ICD-10-CM

## 2015-08-11 LAB — CBC
HCT: 33.7 % — ABNORMAL LOW (ref 39.0–52.0)
HEMOGLOBIN: 11.1 g/dL — AB (ref 13.0–17.0)
MCH: 30.9 pg (ref 26.0–34.0)
MCHC: 32.9 g/dL (ref 30.0–36.0)
MCV: 93.9 fL (ref 78.0–100.0)
PLATELETS: 402 10*3/uL — AB (ref 150–400)
RBC: 3.59 MIL/uL — AB (ref 4.22–5.81)
RDW: 14.6 % (ref 11.5–15.5)
WBC: 22.6 10*3/uL — AB (ref 4.0–10.5)

## 2015-08-11 LAB — COMPREHENSIVE METABOLIC PANEL
ALBUMIN: 2.5 g/dL — AB (ref 3.5–5.0)
ALK PHOS: 66 U/L (ref 38–126)
ALT: 27 U/L (ref 17–63)
ANION GAP: 13 (ref 5–15)
AST: 19 U/L (ref 15–41)
BUN: 61 mg/dL — ABNORMAL HIGH (ref 6–20)
CALCIUM: 8.6 mg/dL — AB (ref 8.9–10.3)
CO2: 25 mmol/L (ref 22–32)
Chloride: 106 mmol/L (ref 101–111)
Creatinine, Ser: 1.65 mg/dL — ABNORMAL HIGH (ref 0.61–1.24)
GFR calc non Af Amer: 36 mL/min — ABNORMAL LOW (ref >60)
GFR, EST AFRICAN AMERICAN: 42 mL/min — AB (ref >60)
GLUCOSE: 154 mg/dL — AB (ref 65–99)
POTASSIUM: 3.8 mmol/L (ref 3.5–5.1)
SODIUM: 144 mmol/L (ref 135–145)
Total Bilirubin: 1 mg/dL (ref 0.3–1.2)
Total Protein: 6.2 g/dL — ABNORMAL LOW (ref 6.5–8.1)

## 2015-08-11 LAB — VANCOMYCIN, TROUGH: Vancomycin Tr: 20 ug/mL (ref 10.0–20.0)

## 2015-08-11 MED ORDER — VANCOMYCIN HCL IN DEXTROSE 750-5 MG/150ML-% IV SOLN
750.0000 mg | INTRAVENOUS | Status: DC
  Administered 2015-08-11 – 2015-08-12 (×2): 750 mg via INTRAVENOUS
  Filled 2015-08-11 (×2): qty 150

## 2015-08-11 NOTE — Progress Notes (Signed)
Daily Progress Note   Patient Name: Kyle [name]       Date: 08/11/2015 DOB: 02/22/1927  Age: 82 y.o. MRN#: [id number] Attending Physician: Jerald KiefStephen K Chiu, MD Primary Care Physician: Ezequiel KayserPERINI,MARK A, MD Admit Date: Aug 30, 2015  82 yo male with Afib (anticoag d/c'd), COPD, OA, torn right rotator cuff, who has had a chronic foley cath in place since 01/2015, was admitted with a fall, multiple facial fractures and a subarachnoid hemorrhage on 3/31. He was discharged to [facility] on 4/6. He was readmitted 4/11 with AMS, new small SDH, and HCAP. CTA Chest revealed multiple PEs with right sided heart strain, left lobe collapse, and right sided aspiration pna. He is unable to take anticoagulation. Further he has been tachycardic requiring a diltiazem drip.  Of note, Kyle patient's chronic Foley catheter is managed by Dr. Imelda PillowS. Tannenbaum  Reason for Consultation/Follow-up: Establishing goals of care  Subjective: patient responsive only to pain.  Interval Events:  Patient has decreased LOC today.  Yesterday his eyes opened to voice and he attempted to communicate at a whisper.  Today, he does not respond to voice or touch.  He does respond to pain.  Work of breathing appears decreased and more comfortable.  He is on only 4L n/c.  His creatinine has risen 1.65.  WBC is slightly up as well at 22.  Being unresponsive he is still unable to take nutrition.  He is still on a cardizem drip with a pulse of 100s - 110s  Dr. Susie CassetteAbrol and I discussed on 4/13 whether or not Kyle patient would become stable enough to have an IVC placed.  At best he will not be a candidate for anticoagulation for at least a month given [facility] current [facility].    I had a long conversation with Kyle patient's son, Kyle NoaWilliam, on 4/13 and made  him aware that his father may not survive.  This was very difficult for Kyle NoaWilliam to hear.  Kyle NoaWilliam is Kyle 24/7 care taker for his mother who has Alzheimer's Dementia.     I spoke with Kyle NoaWilliam again this morning to let him know his father was less responsive but breathing more comfortably.   Kyle NoaWilliam expressed that this father is very sensitive to sedating medications - but that he had given permission for Kyle RNs to give "  morphine" (actually fentanyl is ordered) last evening as his father was so uncomfortable.  Kyle patient had 1 dose of 0.25 mcg fentany and 1 dose of 0.5 mg haldol yesterday evening and none since.   His lack of responsiveness is very concerning to me.  Palliative medicine will continue to follow with Kyle [name] over Kyle weekend.  Unfortunately I anticipate that Kyle [name] will not be able to recover.   Length of Stay: 3 days  Current Medications: Scheduled Meds:  . antiseptic oral rinse  7 mL Mouth Rinse BID  . chlorhexidine  15 mL Mouth Rinse BID  . piperacillin-tazobactam (ZOSYN)  IV  3.375 g Intravenous 3 times per day  . vancomycin  1,000 mg Intravenous Q24H    Continuous Infusions: . diltiazem (CARDIZEM) infusion 7.5 mg/hr (08/11/15 0344)    PRN Meds: acetaminophen **OR** acetaminophen, fentaNYL (SUBLIMAZE) injection, haloperidol lactate, ondansetron **OR** ondansetron (ZOFRAN) IV   Physical Exam             Frail, cachectically thin male, bruised face, lying in bed on 4L, and dilt drip, responsive only to pain. CV:  Irreg rhythm, but no significant M/r/g Resp:  Min increased work of breathing, no resp distress. Abdomen:  Thin, ND Extremities:  No edema.   Vital Signs: BP 103/71 mmHg  Pulse 102  Temp(Src) 97.4 F (36.3 C) (Axillary)  Resp 21  Ht 6' (1.829 m)  Wt 60.5 kg (133 lb 6.1 oz)  BMI 18.09 kg/m2  SpO2 100% SpO2: SpO2: 100 % O2 Device: O2 Device: Nasal Cannula O2 Flow Rate: O2 Flow Rate (L/min): 4 L/min  Intake/output summary:    Intake/Output Summary (Last 24 hours) at 08/11/15 1010 Last data filed at 08/11/15 4034  Gross per 24 hour  Intake  442.5 ml  Output    500 ml  Net  -57.5 ml   LBM: Last BM Date: 08/10/15 Baseline Weight: Weight: 61.4 kg (135 lb 5.8 oz) Most recent weight: Weight: 60.5 kg (133 lb 6.1 oz)       Palliative Assessment/Data: Flowsheet Rows        Most Recent Value   Intake Tab    Referral Department  Hospitalist   Unit at Time of Referral  Intermediate Care Unit   Palliative Care Primary Diagnosis  Other (Comment)   Date Notified  08/09/15   Palliative Care Type  New Palliative care   Reason for referral  Clarify Goals of Care   Date of Admission  08/04/2015   Date first seen by Palliative Care  08/10/15   # of days Palliative referral response time  1 Day(s)   # of days IP prior to Palliative referral  1   Clinical Assessment    Palliative Performance Scale Score  20%   Pain Max last 24 hours  6   Pain Min Last 24 hours  2   Dyspnea Max Last 24 Hours  9   Dyspnea Min Last 24 hours  2   Psychosocial & Spiritual Assessment    Palliative Care Outcomes    Patient/[name] meeting held?  Yes   Who was at Kyle meeting?  son Kyle [name], patient   Palliative Care Outcomes  Completed durable DNR, Improved non-pain symptom therapy   Patient/[name] wishes: Interventions discontinued/not started   Mechanical Ventilation      Additional Data Reviewed: CBC    Component Value Date/Time   WBC 22.6* 08/11/2015 0637   RBC 3.59* 08/11/2015 0637   HGB 11.1* 08/11/2015 7425  HCT 33.7* 08/11/2015 0637   PLT 402* 08/11/2015 0637   MCV 93.9 08/11/2015 0637   MCH 30.9 08/11/2015 0637   MCHC 32.9 08/11/2015 0637   RDW 14.6 08/11/2015 0637   LYMPHSABS 3.2 08/07/2015 1625   MONOABS 0.8 08/07/2015 1625   EOSABS 0.1 08/23/2015 1625   BASOSABS 0.0 08/12/2015 1625    CMP     Component Value Date/Time   NA 144 08/11/2015 0637   K 3.8 08/11/2015 0637   CL 106 08/11/2015 0637   CO2 25  08/11/2015 0637   GLUCOSE 154* 08/11/2015 0637   BUN 61* 08/11/2015 0637   CREATININE 1.65* 08/11/2015 0637   CALCIUM 8.6* 08/11/2015 0637   PROT 6.2* 08/11/2015 0637   ALBUMIN 2.5* 08/11/2015 0637   AST 19 08/11/2015 0637   ALT 27 08/11/2015 0637   ALKPHOS 66 08/11/2015 0637   BILITOT 1.0 08/11/2015 0637   GFRNONAA 36* 08/11/2015 0637   GFRAA 42* 08/11/2015 0637       Problem List:  Patient Active Problem List   Diagnosis Date Noted  . Atrial fibrillation (HCC)   . Dyspnea   . Acute delirium   . Palliative care encounter   . DNR (do not resuscitate)   . Goals of care, counseling/discussion   . Encounter for hospice care discussion   . Pressure ulcer 08/09/2015  . Altered mental status 08/22/2015  . Leukocytosis 08/03/2015  . Facial trauma after recent fall 08/07/2015  . Subarachnoid hemorrhage (HCC) recent after fall 08/20/2015  . Cheyne-Stokes respiration 08/25/2015  . Subdural hemorrhage (HCC)   . Fall 08/01/2015  . Closed fracture of facial bones (HCC) 08/01/2015  . Fracture of cervical spinous process (HCC) 08/01/2015  . TBI (traumatic brain injury) (HCC) 07/28/2015  . Closed left hip fracture (HCC) 05/18/2014  . Osteoarthritis of right hip 03/31/2012  . Weakness generalized 08/20/2010  . HTN (hypertension) 08/20/2010  . Atrial fibrillation with RVR (HCC) 07/17/2010  . Chronic anticoagulation 07/17/2010  . COPD (chronic obstructive pulmonary disease) (HCC) 07/17/2010     Palliative Care Assessment & Plan    1.Code Status: DNR   2. Goals of Care/Additional Recommendations:  DNR / DNI  Full Scope Treatment for now.  Will continue to educate / inform Kyle [name].   Unfortunately at this point working towards residential hospice.    Desire for further Chaplaincy support:yes  Psycho-social Needs: Caregiving  Support/Resources  3. Symptom Management:      1. Low dose fentanyl for pain / dyspnea.         2.  Low dose Haldol for agitation / delirium  4.  Palliative Prophylaxis:   Bowel Regimen, Delirium Protocol and Frequent Pain Assessment  5. Prognosis: < 2 weeks  6. Discharge Planning:  Hospital Death vs Hospice House.   Care plan was discussed with Case management  Thank you for allowing Kyle Palliative Medicine Team to assist in Kyle care of this patient.   Time In: 10:00 Time Out: 10:35 Total Time 35 min Prolonged Time Billed no        Stephani Police, PA-C  08/11/2015, 10:10 AM  Please contact Palliative Medicine Team phone at [phone] for questions and concerns.

## 2015-08-11 NOTE — Progress Notes (Signed)
ANTIBIOTIC CONSULT NOTE   Pharmacy Consult for Vancomycin Indication: PNA  Allergies  Allergen Reactions  . Advair Diskus [Fluticasone-Salmeterol] Other (See Comments)    Unknown reaction per son  . Dilaudid [Hydromorphone Hcl] Other (See Comments)    Makes patient very loopy and disoriented  . Morphine And Related Other (See Comments)    Loopy and disoriented    Patient Measurements: Height: 6' (182.9 cm) Weight: 133 lb 6.1 oz (60.5 kg) IBW/kg (Calculated) : 77.6 Adjusted Body Weight:   Vital Signs: Temp: 98 F (36.7 C) (04/14 1913) Temp Source: Oral (04/14 1913) BP: 100/80 mmHg (04/14 1913) Pulse Rate: 88 (04/14 1913) Intake/Output from previous day: 04/13 0701 - 04/14 0700 In: 442.5 [I.V.:92.5; IV Piggyback:350] Out: 825 [Urine:825] Intake/Output from this shift:    Labs:  Recent Labs  08/09/15 0342 08/10/15 0412 08/11/15 0637  WBC 18.2* 20.2* 22.6*  HGB 11.4* 12.6* 11.1*  PLT 381 463* 402*  CREATININE 1.16 1.25* 1.65*   Estimated Creatinine Clearance: 27.5 mL/min (by C-G formula based on Cr of 1.65).  Recent Labs  08/11/15 1826  VANCOTROUGH 20     Microbiology:   Medical History: Past Medical History  Diagnosis Date  . PAF (paroxysmal atrial fibrillation) (HCC)     Managed with rate control and coumadin  . COPD (chronic obstructive pulmonary disease) (HCC)   . HTN (hypertension)   . Hyperlipemia   . OA (osteoarthritis)   . Chronic anticoagulation   . Incomplete rotator cuff tear or rupture of right shoulder, not specified as traumatic 2012  . Chronic anticoagulation   . Nocturia   . Osteoarthritis of right hip 03/31/2012  . Shortness of breath   . Asthma     Assessment: Infectious Disease: Vanc for possible PNA, WBC 18.2 > 20.2 > 22.6. Scr has increased 1.25>1.65.  Vanc 4/11 >> --4/14 VT 20 Zosyn 4/11 >>  4/11 MRSA PCR negative 4/11 blood x 2 4/11 urine negative  Goal of Therapy:  Vancomycin trough level 15-20 mcg/ml  Plan:   Change Vancomycin to 750mg  IV q24h. Trough again soon if continued or if renal function continues to worsen.   Solae Norling S. Merilynn Finlandobertson, PharmD, BCPS Clinical Staff Pharmacist Pager [phone]  Misty Stanleyobertson, Alfonzia Woolum Stillinger 08/11/2015,7:17 PM

## 2015-08-11 NOTE — Care Management Important Message (Signed)
Important Message  Patient Details  Name: Kyle EdouardMyrl Cappelletti MRN: [id number] Date of Birth: 01/20/1927   Medicare Important Message Given:  Yes    Dakota Vanwart Abena 08/11/2015, 11:14 AM

## 2015-08-11 NOTE — Progress Notes (Signed)
Pharmacy Antibiotic Note  Kyle [name] is a 82 y.o. male admitted on 2015/08/28 with pneumonia.  Pharmacy has been consulted for Vancomycin dosing.  Renal function has declined since admission.  Concerned for drug accumulation.  Plan: D/C standing Vancomycin order. Vancomycin trough level at 1800 tonight.  Height: 6' (182.9 cm) Weight: 133 lb 6.1 oz (60.5 kg) IBW/kg (Calculated) : 77.6  Temp (24hrs), Avg:97.5 F (36.4 C), Min:96.7 F (35.9 C), Max:98.6 F (37 C)   Recent Labs Lab 10/18/15 1625 10/18/15 1645 10/18/15 2116 08/09/15 0342 08/10/15 0412 08/11/15 0637  WBC 17.3*  --   --  18.2* 20.2* 22.6*  CREATININE 1.45*  --   --  1.16 1.25* 1.65*  LATICACIDVEN  --  2.01* 1.95  --   --   --     Estimated Creatinine Clearance: 27.5 mL/min (by C-G formula based on Cr of 1.65).    Allergies  Allergen Reactions  . Advair Diskus [Fluticasone-Salmeterol] Other (See Comments)    Unknown reaction per son  . Dilaudid [Hydromorphone Hcl] Other (See Comments)    Makes patient very loopy and disoriented  . Morphine And Related Other (See Comments)    Loopy and disoriented    Antimicrobials this admission: Vanc 4/11 >> Zosyn 4/11 >>  Dose adjustments this admission:   Microbiology results: 4/11 MRSA PCR negative 4/11 blood x 2 4/11 urine negative   Thank you for allowing pharmacy to be a part of this patient's care.  Kyle [name], Pharm.D., BCPS Clinical Pharmacist Pager [phone] 08/11/2015 1:44 PM

## 2015-08-11 NOTE — Progress Notes (Signed)
PROGRESS NOTE    Dent Clutter  UUV:[id number] DOB: 10/19/1926 DOA: 02-09-16 PCP: Ezequiel KayserPERINI,MARK A, MD  Outpatient Specialists:     Brief Narrative:  82 y.o. male with hx of HTN, afib, COPD. He was here 2 weeks ago for 7d hosp stay after mechanical fall with multiple facial fractures R face and L frontal lobe SAH w some IV bleeding. There was no shift and MS was stable, as below NSurg rec'd conserviative management and pt was dc'd to SNF ([facility]). Yesterday family was visitng and noticed some decrease in MS. Today per SNF staff MS was worse again so sent to ED. Here pt is very weak, whispering, disoriented. Creat / Na up slightly, has rhonchorous cough/ congestion. CXR not available due to IT issues. WBC up 17k.   Assessment & Plan:   Principal Problem:   Altered mental status Active Problems:   Atrial fibrillation with RVR (HCC)   COPD (chronic obstructive pulmonary disease) (HCC)   Weakness generalized   Leukocytosis   Facial trauma after recent fall   Subarachnoid hemorrhage (HCC) recent after fall   Cheyne-Stokes respiration   Pressure ulcer   Atrial fibrillation (HCC)   Dyspnea   Acute delirium   Palliative care encounter   DNR (do not resuscitate)   Goals of care, counseling/discussion   Encounter for hospice care discussion   Acute toxic/metabolic encephalopathy - recent fall w SAH, today's CT shows new subdural hematoma but no shift and is not large. PNA versus secondary to aspiration. Initial chest x-ray negative but CT chest confirms PNA. Continue empiric broad-spectrum antibiotics for healthcare associated pneumonia. empiric for PNA, follow blood cultures closely.   New subdural hematoma - CT of the head done due to recent subdural hematoma secondary to head trauma on 07/28/15. Patient found to have new low-density right subdural hematoma versus subdural hygroma measuring 8 mm. There is no midline shift. Given acute change in mental status over the last couple of  days.Patient off Coumadin after last admission. Stroke is certainly a possibility as the patient has been taken off anticoagulation. As we are heading towards palliation, hold off on MRI brain  Acute hypoxemic respiratory failure-likely secondary to aspiration, continue broad-spectrum antibiotics, speech therapy evaluation, oxygen per protocol. Aspiration precautions. Pulmonary embolism is a possibility however the patient. CTA chest showed multiple segmental and subsegmental pulmonary emboli with complete collapse of the left lower lobe and evidence of RH strain, and patchy airspace opacity within the right lower lobe, appear to reflect aspiration pneumonia, Not a candidate for anticoagulation at this time given recent SAH  Afib - vent rate 112, continue IV dilt for now as the patient unable to swallow, target heart rate less than 120. Off anticoagulation. Therefore high risk for stroke.  HTN -stable  Recent SAH/ IV bleed - after mech fall, rx'd conservatively, coumadin dc'd during last admission  Facial trauma - also rx'd conservatively   COPD by hx - stable  Hypernatremia - resolved  Acute renal insuff -IV fluids discontinued given shortness of breath,Cr is worsening  End of Life - Appreciate input by Palliative Care. Overall very poor prognosis. Anticipate possible hospital death vs transfer to residential hospice. Monitoring closely for the time being. As per above, see no utility in MRI brain, thus have held off, especially given possible plans to transition to residential hospice vs comfort measures   DVT prophylaxis: SCD Code Status:DNR Family Communication: Patient in room Disposition Plan: Possible residential hospice in 24-48hrs vs shift to comfort measures  Consultants:   Palliative Care  Procedures:     Antimicrobials:  Vanc and zosyn 4/11>>>    Subjective: No complaints  Objective: Filed Vitals:   08/11/15 0724 08/11/15 1104 08/11/15 1544 08/11/15  1604  BP: 103/71  135/74 119/70  Pulse: 102  110 86  Temp: 97.4 F (36.3 C) 97.9 F (36.6 C) 97.8 F (36.6 C) 97.1 F (36.2 C)  TempSrc: Axillary Axillary Axillary Axillary  Resp:    20  Height:      Weight:      SpO2:   99% 99%    Intake/Output Summary (Last 24 hours) at 08/11/15 1713 Last data filed at 08/11/15 1548  Gross per 24 hour  Intake  392.5 ml  Output    550 ml  Net -157.5 ml   Filed Weights   08/09/15 0356 08/10/15 0500 08/11/15 0448  Weight: 61.4 kg (135 lb 5.8 oz) 59.3 kg (130 lb 11.7 oz) 60.5 kg (133 lb 6.1 oz)    Examination:  General exam: Appears mildly anxious  Respiratory system: Clear to auscultation. Increased resp effort. Cardiovascular system: S1 & S2 heard, tachycardic Gastrointestinal system: Abdomen is nondistended, soft and nontender. No organomegaly or masses felt. Normal bowel sounds heard. Central nervous system: Alert and oriented. No focal neurological deficits. Extremities: perfused, no clubbing Skin: No rashes, lesions or ulcers Psychiatry: appears anxious    Data Reviewed: I have personally reviewed following labs and imaging studies  CBC:  Recent Labs Lab 2015-09-03 1625 08/09/15 0342 08/10/15 0412 08/11/15 0637  WBC 17.3* 18.2* 20.2* 22.6*  NEUTROABS 13.2*  --   --   --   HGB 12.3* 11.4* 12.6* 11.1*  HCT 38.4* 35.8* 39.5 33.7*  MCV 93.2 94.0 93.8 93.9  PLT 407* 381 463* 402*   Basic Metabolic Panel:  Recent Labs Lab 09/03/2015 1625 08/09/15 0342 08/10/15 0412 08/11/15 0637  NA 146* 144 143 144  K 4.1 3.6 3.2* 3.8  CL 107 107 99* 106  CO2 25 24 28 25   GLUCOSE 117* 136* 132* 154*  BUN 43* 40* 32* 61*  CREATININE 1.45* 1.16 1.25* 1.65*  CALCIUM 9.0 8.6* 9.1 8.6*  MG  --   --  1.9  --    GFR: Estimated Creatinine Clearance: 27.5 mL/min (by C-G formula based on Cr of 1.65). Liver Function Tests:  Recent Labs Lab Sep 03, 2015 1625 08/10/15 0412 08/11/15 0637  AST 35 29 19  ALT 46 36 27  ALKPHOS 80 80 66    BILITOT 1.5* 1.3* 1.0  PROT 7.0 7.3 6.2*  ALBUMIN 2.8* 2.9* 2.5*   No results for input(s): LIPASE, AMYLASE in the last 168 hours. No results for input(s): AMMONIA in the last 168 hours. Coagulation Profile:  Recent Labs Lab 09-03-15 1625  INR 1.39   Cardiac Enzymes: No results for input(s): CKTOTAL, CKMB, CKMBINDEX, TROPONINI in the last 168 hours. BNP (last 3 results) No results for input(s): PROBNP in the last 8760 hours. HbA1C:  Recent Labs  08/09/15 0342  HGBA1C 6.1*   CBG: No results for input(s): GLUCAP in the last 168 hours. Lipid Profile: No results for input(s): CHOL, HDL, LDLCALC, TRIG, CHOLHDL, LDLDIRECT in the last 72 hours. Thyroid Function Tests: No results for input(s): TSH, T4TOTAL, FREET4, T3FREE, THYROIDAB in the last 72 hours. Anemia Panel: No results for input(s): VITAMINB12, FOLATE, FERRITIN, TIBC, IRON, RETICCTPCT in the last 72 hours. Urine analysis:    Component Value Date/Time   COLORURINE YELLOW 2015/09/03 1627   APPEARANCEUR CLEAR 2015/09/03  1627   LABSPEC 1.021 08/07/2015 1627   PHURINE 6.0 08/21/2015 1627   GLUCOSEU NEGATIVE 08/10/2015 1627   HGBUR SMALL* 08/09/2015 1627   BILIRUBINUR NEGATIVE 08/03/2015 1627   KETONESUR 15* 08/02/2015 1627   PROTEINUR 30* 08/07/2015 1627   UROBILINOGEN 0.2 01/22/2015 0915   NITRITE NEGATIVE 08/07/2015 1627   LEUKOCYTESUR TRACE* 07/30/2015 1627   Sepsis Labs: @LABRCNTIP (procalcitonin:4,lacticidven:4)  ) Recent Results (from the past 240 hour(s))  Culture, Urine     Status: Abnormal   Collection Time: 08/03/15 11:22 AM  Result Value Ref Range Status   Specimen Description URINE, CATHETERIZED  Final   Special Requests NONE  Final   Culture >=100,000 COLONIES/mL PSEUDOMONAS AERUGINOSA (A)  Final   Report Status 08/06/2015 FINAL  Final   Organism ID, Bacteria PSEUDOMONAS AERUGINOSA (A)  Final      Susceptibility   Pseudomonas aeruginosa - MIC*    CEFTAZIDIME 2 SENSITIVE Sensitive      CIPROFLOXACIN 2 INTERMEDIATE Intermediate     GENTAMICIN <=1 SENSITIVE Sensitive     IMIPENEM 2 SENSITIVE Sensitive     PIP/TAZO <=4 SENSITIVE Sensitive     CEFEPIME <=1 SENSITIVE Sensitive     * >=100,000 COLONIES/mL PSEUDOMONAS AERUGINOSA  Blood culture (routine x 2)     Status: None (Preliminary result)   Collection Time: 08/07/2015  4:25 PM  Result Value Ref Range Status   Specimen Description BLOOD LEFT ANTECUBITAL  Final   Special Requests BOTTLES DRAWN AEROBIC AND ANAEROBIC 5CC  Final   Culture NO GROWTH 3 DAYS  Final   Report Status PENDING  Incomplete  Urine culture     Status: None   Collection Time: 07/31/2015  4:27 PM  Result Value Ref Range Status   Specimen Description URINE, CATHETERIZED  Final   Special Requests NONE  Final   Culture NO GROWTH 2 DAYS  Final   Report Status 08/10/2015 FINAL  Final  Blood culture (routine x 2)     Status: None (Preliminary result)   Collection Time: 08/22/2015  4:55 PM  Result Value Ref Range Status   Specimen Description BLOOD LEFT ANTECUBITAL  Final   Special Requests BOTTLES DRAWN AEROBIC AND ANAEROBIC 10CC  Final   Culture NO GROWTH 3 DAYS  Final   Report Status PENDING  Incomplete  MRSA PCR Screening     Status: None   Collection Time: 08/14/2015 10:05 PM  Result Value Ref Range Status   MRSA by PCR NEGATIVE NEGATIVE Final    Comment:        The GeneXpert MRSA Assay (FDA approved for NASAL specimens only), is one component of a comprehensive MRSA colonization surveillance program. It is not intended to diagnose MRSA infection nor to guide or monitor treatment for MRSA infections.          Radiology Studies: Ct Angio Chest Pe W/cm &/or Wo Cm  08/09/2015  CLINICAL DATA:  Acute onset of shortness of breath. Initial encounter. EXAM: CT ANGIOGRAPHY CHEST WITH CONTRAST TECHNIQUE: Multidetector CT imaging of the chest was performed using the standard protocol during bolus administration of intravenous contrast. Multiplanar CT  image reconstructions and MIPs were obtained to evaluate the vascular anatomy. CONTRAST:  80 mL of Isovue 370 IV contrast COMPARISON:  Chest radiograph performed earlier today at 4:20 p.m., and CT of the right shoulder performed 08/01/2011 FINDINGS: Multiple segmental and subsegmental pulmonary emboli are noted within pulmonary artery branches to the right upper lobe, right lower lobe and left upper lobe. The RV/LV ratio  of 1.6 corresponds to at least submassive pulmonary embolus and concern for right heart strain. There is complete collapse of the left lower lobe, which appears to reflect underlying aspiration pneumonia, as debris is noted filling the bronchi to the left lower lobe. More mild aspiration is noted within the bronchioles to the right lower lobe, and to the left upper lobe. Patchy airspace opacities noted within the right lower lobe. Underlying bilateral emphysematous change is noted. Scarring and mild calcification are noted at the lung apices. There is no evidence of pleural effusion or pneumothorax. No masses are identified; no abnormal focal contrast enhancement is seen. There is mild aneurysmal dilatation of the ascending thoracic aorta to 4.3 cm in AP dimension. The aortic arch measures 3.8 cm in diameter. Mild biatrial enlargement is noted. Diffuse coronary artery calcifications are seen. Trace pericardial fluid remains within normal limits. No definite mediastinal lymphadenopathy is seen. No axillary lymphadenopathy is seen. The thyroid gland is unremarkable in appearance. There is reflux of contrast into the hepatic veins and IVC. Scattered calcified granulomata are noted within the spleen. The patient is status post cholecystectomy, with clips noted at the gallbladder fossa. The visualized portions of the pancreas, adrenal glands and right kidney are grossly unremarkable. A 2.2 cm left renal cyst is noted. Prominent calcification is noted along the proximal left renal artery. No acute osseous  abnormalities are seen. Review of the MIP images confirms the above findings. IMPRESSION: 1. Multiple segmental and subsegmental pulmonary emboli within pulmonary artery branches to the right upper lobe, right lower lobe and left upper lobe. CT evidence of right heart strain (RV/LV Ratio = 1.6) consistent with at least submassive (intermediate risk) PE. The presence of right heart strain has been associated with an increased risk of morbidity and mortality. However, per clinical correlation, the patient cannot tolerate blood thinners given recent subdural hemorrhage. 2. Complete collapse of the left lower lobe, and patchy airspace opacity within the right lower lobe, appear to reflect aspiration pneumonia, as debris is noted filling the bronchi to the left lower lobe, and additional debris is noted within the bronchioles to the right lower lobe and left upper lobe. 3. Underlying bilateral emphysematous change noted. Scarring and mild calcification at the lung apices. 4. Mild aneurysmal dilatation of the ascending thoracic aorta to 4.3 cm in AP dimension. This is within the normal range for the patient's age. 5. Mild biatrial enlargement noted. 6. Diffuse coronary artery calcifications seen. 7. 2.2 cm left renal cyst noted. 8. Calcification along the proximal left renal artery. Critical Value/emergent results were called by telephone at the time of interpretation on 08/09/2015 at 6:35 pm to Dr. Richarda Overlie, who verbally acknowledged these results. Electronically Signed   By: Roanna Raider M.D.   On: 08/09/2015 18:38   Dg Chest Port 1 View  08/10/2015  CLINICAL DATA:  Acute onset of cough.  Initial encounter. EXAM: PORTABLE CHEST 1 VIEW COMPARISON:  Chest radiograph and CTA of the chest performed 08/09/2015 FINDINGS: The lungs are well-aerated. Mild left basilar airspace opacity may reflect atelectasis or possibly pneumonia. There is no evidence of pleural effusion or pneumothorax. The cardiomediastinal silhouette  is borderline normal in size. No acute osseous abnormalities are seen. IMPRESSION: Mild left basilar airspace opacity may reflect atelectasis or possibly mild pneumonia. Electronically Signed   By: Roanna Raider M.D.   On: 08/10/2015 03:05        Scheduled Meds: . antiseptic oral rinse  7 mL Mouth Rinse BID  .  chlorhexidine  15 mL Mouth Rinse BID  . piperacillin-tazobactam (ZOSYN)  IV  3.375 g Intravenous 3 times per day   Continuous Infusions: . diltiazem (CARDIZEM) infusion 10 mg/hr (08/11/15 1543)     LOS: 3 days    Aimar Shrewsbury, Scheryl Marten, MD [facility] Pager [phone]  If 7PM-7AM, please contact night-coverage [email or web address] Password [facility] 08/11/2015, 5:13 PM

## 2015-08-12 DIAGNOSIS — I480 Paroxysmal atrial fibrillation: Secondary | ICD-10-CM

## 2015-08-12 DIAGNOSIS — Z515 Encounter for palliative care: Secondary | ICD-10-CM

## 2015-08-12 NOTE — Progress Notes (Signed)
PROGRESS NOTE    Kyle [name]  ZOX:[id number] DOB: 04-11-1927 DOA: Sep 06, 2015 PCP: [name], MD  Outpatient Specialists:     Brief Narrative:  82 y.o. male with hx of HTN, afib, COPD. He was here 2 weeks ago for 7d hosp stay after mechanical fall with multiple facial fractures R face and L frontal lobe SAH w some IV bleeding. There was no shift and MS was stable, as below NSurg rec'd conserviative management and pt was dc'd to SNF ([facility]). Yesterday family was visitng and noticed some decrease in MS. Today per SNF staff MS was worse again so sent to ED. Here pt is very weak, whispering, disoriented. Creat / Na up slightly, has rhonchorous cough/ congestion. CXR not available due to IT issues. WBC up 17k.   Assessment & Plan:   Principal Problem:   Altered mental status Active Problems:   Atrial fibrillation with RVR (HCC)   COPD (chronic obstructive pulmonary disease) (HCC)   Weakness generalized   Leukocytosis   Facial trauma after recent fall   Subarachnoid hemorrhage (HCC) recent after fall   Cheyne-Stokes respiration   Pressure ulcer   Atrial fibrillation (HCC)   Dyspnea   Acute delirium   Palliative care encounter   DNR (do not resuscitate)   Goals of care, counseling/discussion   Encounter for hospice care discussion   Acute toxic/metabolic encephalopathy - recent fall w SAH, CT shows new subdural hematoma but no shift and is not large. PNA versus secondary to aspiration. Initial chest x-ray negative but CT chest confirms PNA. Continue empiric broad-spectrum antibiotics for healthcare associated pneumonia. empiric for PNA, follow blood cultures closely. MRI originally ordered to r/o CVA, however as we seem to be moving towards full palliation, will hold off  New subdural hematoma - CT of the head done due to recent subdural hematoma secondary to head trauma on 07/28/15. Patient found to have new low-density right subdural hematoma versus subdural hygroma  measuring 8 mm. There is no midline shift. Given acute change in mental status over the last couple of days.Patient off Coumadin after last admission. Stroke is certainly a possibility as the patient has been taken off anticoagulation. As we are heading towards palliation, hold off on MRI brain  Acute hypoxemic respiratory failure-likely secondary to aspiration, continue broad-spectrum antibiotics, speech therapy evaluation, oxygen per protocol. Aspiration precautions. Pulmonary embolism is a possibility however the patient. CTA chest showed multiple segmental and subsegmental pulmonary emboli with complete collapse of the left lower lobe and evidence of RH strain, and patchy airspace opacity within the right lower lobe, appear to reflect aspiration pneumonia, Not a candidate for anticoagulation given recent SAH  Afib - This AM, rate better controlled. Off anticoagulation given above, thus higher risk for CVA.  HTN -stable  Recent SAH/ IV bleed - after mech fall, rx'd conservatively, coumadin dc'd during last admission  Facial trauma - also rx'd conservatively   COPD by hx - stable  Hypernatremia - resolved  Acute renal insuff -IV fluids discontinued given shortness of breath,Cr is worsening  End of Life - Appreciate input by Palliative Care. Overall very poor prognosis. Anticipate possible hospital death vs transfer to residential hospice. Monitoring closely for the time being. As per above, see no utility in MRI brain, thus have held off, especially given possible plans to transition to residential hospice vs comfort measures. Per Palliative Care, plan to allow family time to better cope with current situation before moving towards comfort measures   DVT prophylaxis: SCD Code  Status:DNR Family Communication: Patient in room Disposition Plan: Possible residential hospice in 24-48hrs vs shift to comfort measures   Consultants:   Palliative Care  Procedures:      Antimicrobials:  Vanc and zosyn 4/11>>>    Subjective: Without complaints  Objective: Filed Vitals:   08/12/15 0328 08/12/15 0733 08/12/15 0735 08/12/15 1650  BP: 118/91  129/68 138/99  Pulse: 115  93 106  Temp: 97.7 F (36.5 C) 98 F (36.7 C)    TempSrc: Oral Oral    Resp: Height:      Weight: 60.2 kg (132 lb 11.5 oz)     SpO2: 100%  98% 100%    Intake/Output Summary (Last 24 hours) at 08/12/15 1658 Last data filed at 08/12/15 1656  Gross per 24 hour  Intake 618.17 ml  Output   1200 ml  Net -581.83 ml   Filed Weights   08/10/15 0500 08/11/15 0448 08/12/15 0328  Weight: 59.3 kg (130 lb 11.7 oz) 60.5 kg (133 lb 6.1 oz) 60.2 kg (132 lb 11.5 oz)    Examination:  General exam: Appears mildly anxious, laying in bed, awake  Respiratory system: Clear to auscultation. Increased resp effort. Cardiovascular system: S1 & S2 heard, tachycardic Gastrointestinal system: Abdomen is nondistended, soft and nontender. No organomegaly or masses felt. Normal bowel sounds heard. Central nervous system: Alert and oriented. No focal neurological deficits. Extremities: perfused, no clubbing Skin: No rashes, lesions or ulcers Psychiatry: appears anxious    Data Reviewed: I have personally reviewed following labs and imaging studies  CBC:  Recent Labs Lab 08/03/2015 1625 08/09/15 0342 08/10/15 0412 08/11/15 0637  WBC 17.3* 18.2* 20.2* 22.6*  NEUTROABS 13.2*  --   --   --   HGB 12.3* 11.4* 12.6* 11.1*  HCT 38.4* 35.8* 39.5 33.7*  MCV 93.2 94.0 93.8 93.9  PLT 407* 381 463* 402*   Basic Metabolic Panel:  Recent Labs Lab 08/01/2015 1625 08/09/15 0342 08/10/15 0412 08/11/15 0637  NA 146* 144 143 144  K 4.1 3.6 3.2* 3.8  CL 107 107 99* 106  CO2 GLUCOSE 117* 136* 132* 154*  BUN 43* 40* 32* 61*  CREATININE 1.45* 1.16 1.25* 1.65*  CALCIUM 9.0 8.6* 9.1 8.6*  MG  --   --  1.9  --    GFR: Estimated Creatinine Clearance: 27.4 mL/min (by C-G  formula based on Cr of 1.65). Liver Function Tests:  Recent Labs Lab 08/12/2015 1625 08/10/15 0412 08/11/15 0637  AST 35 29 19  ALT 46 36 27  ALKPHOS 80 80 66  BILITOT 1.5* 1.3* 1.0  PROT 7.0 7.3 6.2*  ALBUMIN 2.8* 2.9* 2.5*   No results for input(s): LIPASE, AMYLASE in the last 168 hours. No results for input(s): AMMONIA in the last 168 hours. Coagulation Profile:  Recent Labs Lab 08/20/2015 1625  INR 1.39   Cardiac Enzymes: No results for input(s): CKTOTAL, CKMB, CKMBINDEX, TROPONINI in the last 168 hours. BNP (last 3 results) No results for input(s): PROBNP in the last 8760 hours. HbA1C: No results for input(s): HGBA1C in the last 72 hours. CBG: No results for input(s): GLUCAP in the last 168 hours. Lipid Profile: No results for input(s): CHOL, HDL, LDLCALC, TRIG, CHOLHDL, LDLDIRECT in the last 72 hours. Thyroid Function Tests: No results for input(s): TSH, T4TOTAL, FREET4, T3FREE, THYROIDAB in the last 72 hours. Anemia Panel: No results for input(s): VITAMINB12, FOLATE, FERRITIN, TIBC, IRON, RETICCTPCT in the last 72 hours.  Urine analysis:    Component Value Date/Time   COLORURINE YELLOW 2016-03-30 1627   APPEARANCEUR CLEAR 2016-03-30 1627   LABSPEC 1.021 2016-03-30 1627   PHURINE 6.0 2016-03-30 1627   GLUCOSEU NEGATIVE 2016-03-30 1627   HGBUR SMALL* 2016-03-30 1627   BILIRUBINUR NEGATIVE 2016-03-30 1627   KETONESUR 15* 2016-03-30 1627   PROTEINUR 30* 2016-03-30 1627   UROBILINOGEN 0.2 01/22/2015 0915   NITRITE NEGATIVE 2016-03-30 1627   LEUKOCYTESUR TRACE* 2016-03-30 1627   Sepsis Labs: @LABRCNTIP (procalcitonin:4,lacticidven:4)  ) Recent Results (from the past 240 hour(s))  Culture, Urine     Status: Abnormal   Collection Time: 08/03/15 11:22 AM  Result Value Ref Range Status   Specimen Description URINE, CATHETERIZED  Final   Special Requests NONE  Final   Culture >=100,000 COLONIES/mL PSEUDOMONAS AERUGINOSA (A)  Final   Report Status 08/06/2015  FINAL  Final   Organism ID, Bacteria PSEUDOMONAS AERUGINOSA (A)  Final      Susceptibility   Pseudomonas aeruginosa - MIC*    CEFTAZIDIME 2 SENSITIVE Sensitive     CIPROFLOXACIN 2 INTERMEDIATE Intermediate     GENTAMICIN <=1 SENSITIVE Sensitive     IMIPENEM 2 SENSITIVE Sensitive     PIP/TAZO <=4 SENSITIVE Sensitive     CEFEPIME <=1 SENSITIVE Sensitive     * >=100,000 COLONIES/mL PSEUDOMONAS AERUGINOSA  Blood culture (routine x 2)     Status: None (Preliminary result)   Collection Time: 2015/05/31  4:25 PM  Result Value Ref Range Status   Specimen Description BLOOD LEFT ANTECUBITAL  Final   Special Requests BOTTLES DRAWN AEROBIC AND ANAEROBIC 5CC  Final   Culture NO GROWTH 3 DAYS  Final   Report Status PENDING  Incomplete  Urine culture     Status: None   Collection Time: 2015/05/31  4:27 PM  Result Value Ref Range Status   Specimen Description URINE, CATHETERIZED  Final   Special Requests NONE  Final   Culture NO GROWTH 2 DAYS  Final   Report Status 08/10/2015 FINAL  Final  Blood culture (routine x 2)     Status: None (Preliminary result)   Collection Time: 2015/05/31  4:55 PM  Result Value Ref Range Status   Specimen Description BLOOD LEFT ANTECUBITAL  Final   Special Requests BOTTLES DRAWN AEROBIC AND ANAEROBIC 10CC  Final   Culture NO GROWTH 3 DAYS  Final   Report Status PENDING  Incomplete  MRSA PCR Screening     Status: None   Collection Time: 2015/05/31 10:05 PM  Result Value Ref Range Status   MRSA by PCR NEGATIVE NEGATIVE Final    Comment:        The GeneXpert MRSA Assay (FDA approved for NASAL specimens only), is one component of a comprehensive MRSA colonization surveillance program. It is not intended to diagnose MRSA infection nor to guide or monitor treatment for MRSA infections.          Radiology Studies: No results found.      Scheduled Meds: . antiseptic oral rinse  7 mL Mouth Rinse BID  . chlorhexidine  15 mL Mouth Rinse BID  .  piperacillin-tazobactam (ZOSYN)  IV  3.375 g Intravenous 3 times per day  . vancomycin  750 mg Intravenous Q24H   Continuous Infusions: . diltiazem (CARDIZEM) infusion 10 mg/hr (08/12/15 0114)     LOS: 4 days    CHIU, Scheryl MartenSTEPHEN K, MD [facility] Pager [phone]  If 7PM-7AM, please contact night-coverage [email or web address] Password [email or web address] 08/12/2015, 4:58 PM

## 2015-08-12 NOTE — Progress Notes (Signed)
SLP Cancellation Note  Patient Details Name: Kyle EdouardMyrl [name] MRN: [id number] DOB: 09/02/1926   Cancelled treatment:       Reason Eval/Treat Not Completed: Medical issues which prohibited therapy; RN reports pt lethargy and discussion of possible comfort measures this date; will follow for goals of care   Marcene Duoshelsea Sumney MA, CCC-SLP Acute Care Speech Language Pathologist    Kennieth RadSumney, Brinson Tozzi E 08/12/2015, 9:57 AM

## 2015-08-13 LAB — CULTURE, BLOOD (ROUTINE X 2)
Culture: NO GROWTH
Culture: NO GROWTH

## 2015-08-25 ENCOUNTER — Ambulatory Visit: Payer: Medicare Other | Admitting: Physician Assistant

## 2015-08-28 NOTE — Progress Notes (Signed)
RN repositioned patient from right side to left side around 12 midnight, and noted patient's SPO2 was decreasing and respirations more labored. Patient is DNR, and absence of heart tone verified by this writer, and  Hoy RegisterKarlene,RN. RN attempted at patients bedside to reach son Ether GriffinsBill Deshazo on 2 different phone numbers, see face sheet in chart, attempt unsuccessful. Dr. Maryfrances Bunnellanford called at 0010 and updated on change in status. Severn Donor called at 0050 by RN-see blue face sheet in patient's hard chart. Medical Examiner called by Jorja Loaim, RN at [phone]. MD at bedside, death certificate filed. RN attempted to reach pt's son and wife again at both phone numbers listed in chart around 1am. Post-mortem care performed by RN and assistant. Pt taken to morgue and death certificate filled out. Dentures sent to morgue with patient.

## 2015-08-28 NOTE — Discharge Summary (Addendum)
Death Summary  Kyle [name] [id number] DOB: 05/21/1926 DOA: Dec 11, 2015  PCP: Ezequiel KayserPERINI,Kyle [name], Kyle [name]  Admit date: Dec 11, 2015 Date of Death: 08/27/2015  Final Diagnoses:  Principal Problem:   Altered mental status Active Problems:   Atrial fibrillation with RVR (HCC)   COPD (chronic obstructive pulmonary disease) (HCC)   Weakness generalized   Leukocytosis   Facial trauma after recent fall   Subarachnoid hemorrhage (HCC) recent after fall   Cheyne-Stokes respiration   Pressure ulcer   Atrial fibrillation (HCC)   Dyspnea   Acute delirium   Palliative care encounter   DNR (do not resuscitate)   Goals of care, counseling/discussion   Encounter for hospice care discussion   History of present illness:  Please see admit H and P from 4/11 for details. Briefly, 82 y.o. male with hx of HTN, afib, COPD. He was here 2 weeks ago for 7d hosp stay after mechanical fall with multiple facial fractures R face and L frontal lobe SAH w some IV bleeding. There was no shift and MS was stable, as below NSurg rec'd conserviative management and pt was dc'd to SNF ([facility]). Patient presented with new subdural hematoma and was admitted.  Hospital Course:  Acute toxic/metabolic encephalopathy - recent fall w SAH, CT showed new subdural hematoma but no shift and is not large.Initial chest x-ray was negative but CT chest confirmed PNA. Patient was continued on empiric broad-spectrum antibiotics for healthcare associated pneumonia. empiric for PNA. MRI was originally ordered to r/o CVA, however as patient was moving towards full palliation d/c'd order  New subdural hematoma - CT of the head done due to recent subdural hematoma secondary to head trauma on 07/28/15. Patient found to have new low-density right subdural hematoma versus subdural hygroma measuring 8 mm. There was no midline shift. Stroke was later considered. As patient was heading towards palliation, held off on MRI brain  Acute hypoxemic respiratory  failure-likely secondary to aspiration. Patient was continued on broad-spectrum antibiotics. PE was also considered and CTA chest was done and confirmed multiple segmental and subsegmental pulmonary emboli with complete collapse of the left lower lobe and evidence of RH strain, with patchy airspace opacity within the right lower lobe, likely aspiration pneumonia, Patient was not [name] candidate for anticoagulation given recent SAH. Palliative Care was consulted  Afib - patient remained off anticoagulation given above. Patient was continued on cardizem IV  HTN -remained stable  Recent SAH/ IV bleed - Coumadin was dc'd during last admission  Facial trauma - Treated conservatively this admission  COPD by hx - Remained stable  Hypernatremia - This had resolved  Acute renal insuff -IV fluids were discontinued given shortness of breath.  End of Life - Appreciate input by Palliative Care this admission. Had initially anticipated hospital death vs transfer to residential hospice. As per above, saw no utility in MRI brain, thus did not pursue. Full comfort measures were not yet enacted, to give family time to cope.  Hypokalemia - later normalized  Pressure Ulcer - unable to clinically determine prior to time of death  Severe Protein Calorie Malnutrition - seen by Nutrition this admission  Acute Renal Failure - renal function worsened with Cr of 1.65 at time of death, likely related to acute illness   Time of death: 0007  Signed:  Devontay [name] K  [facility] 08/12/2015, 6:51 PM

## 2015-08-28 DEATH — deceased

## 2016-04-13 IMAGING — CT CT HEAD W/O CM
2 series · 16 of 30 positions shown, 18 images · non-contrast
Comparison: Report of noncontrast head CT 07/28/2015 (no images
available).

CLINICAL DATA: 88-year-old male recently treated for UTI and
pneumonia, discharged 08/03/2015. Increased lethargy in worsening
altered mental status. Initial encounter.

EXAM:
CT HEAD WITHOUT CONTRAST
TECHNIQUE: Contiguous axial images were obtained from the base of the skull
through the vertex without intravenous contrast.

[Series 201: head w/o, idose (1) · axial · non-contrast · 0.49mm/px · z∈[+83,+203]mm · 8 of 32 slices shown, 10 images]
[im 4/32  brain]
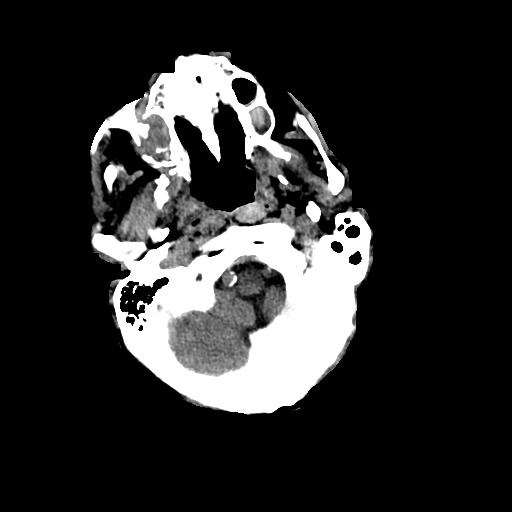
[im 4/32  bone]
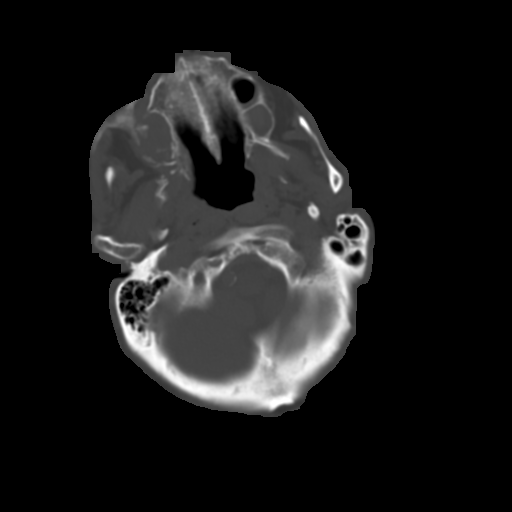
[im 7/32  brain]
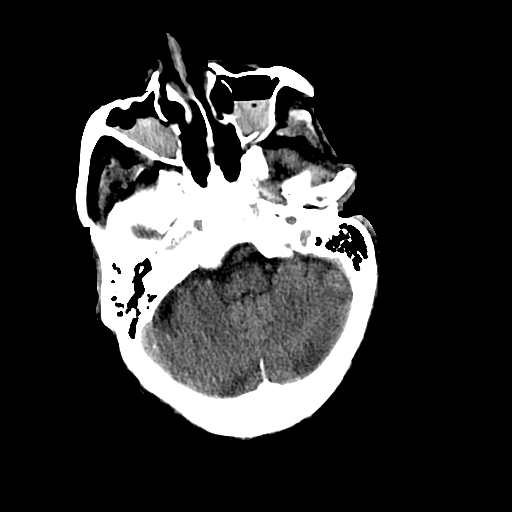
[im 11/32  brain]
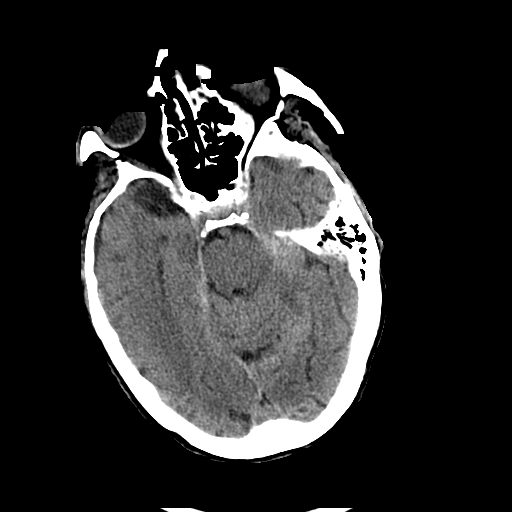
[im 14/32  brain]
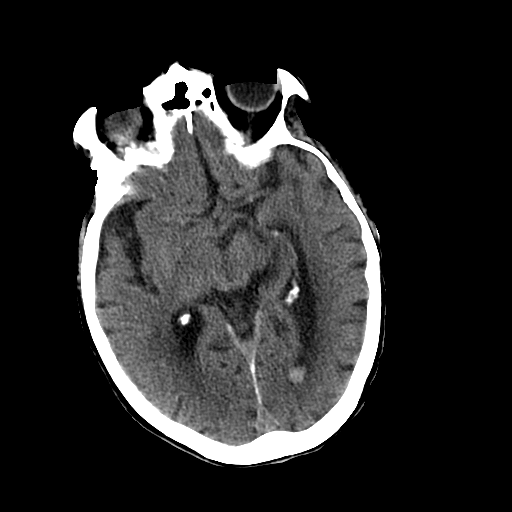
[im 18/32  brain]
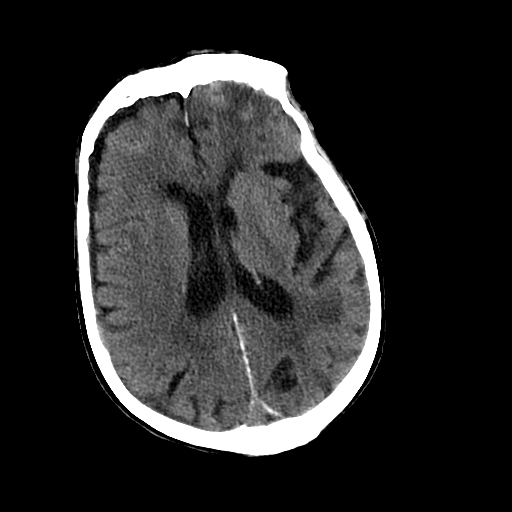
[im 18/32  bone]
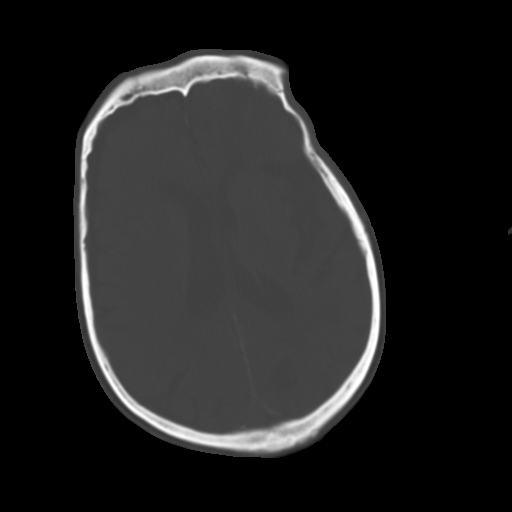
[im 21/32  brain]
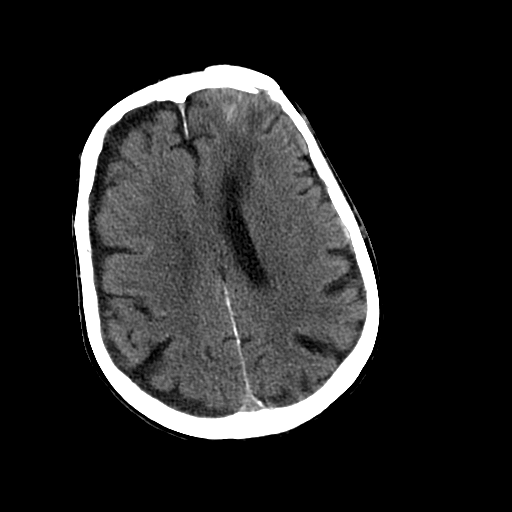
[im 25/32  brain]
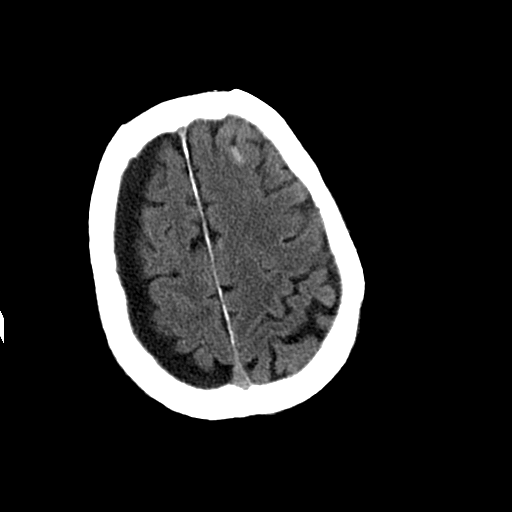
[im 28/32  brain]
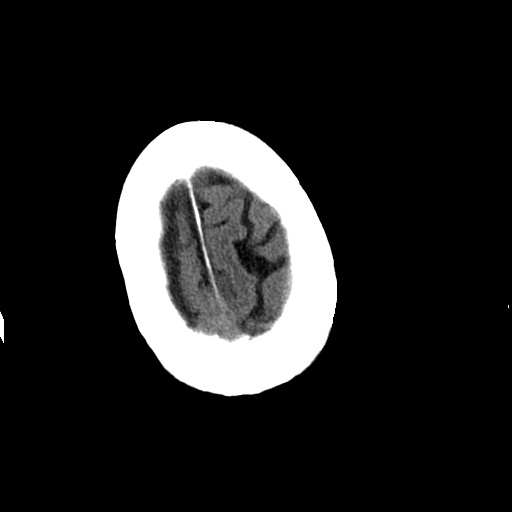

[Series 202: head w/o bone, idose (1) · axial · non-contrast · 0.49mm/px · z∈[+81,+206]mm · 8 of 64 slices shown]
[im 7/64  bone]
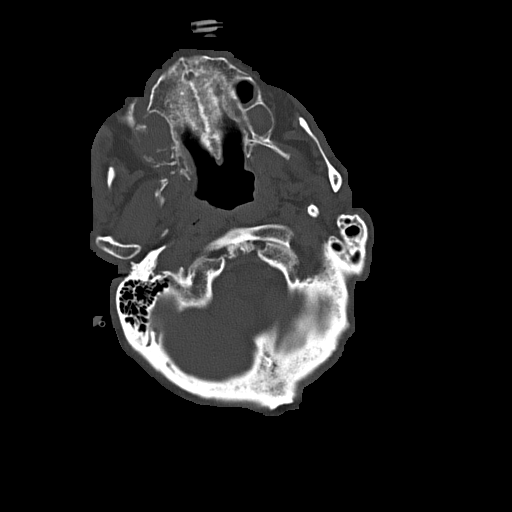
[im 14/64  bone]
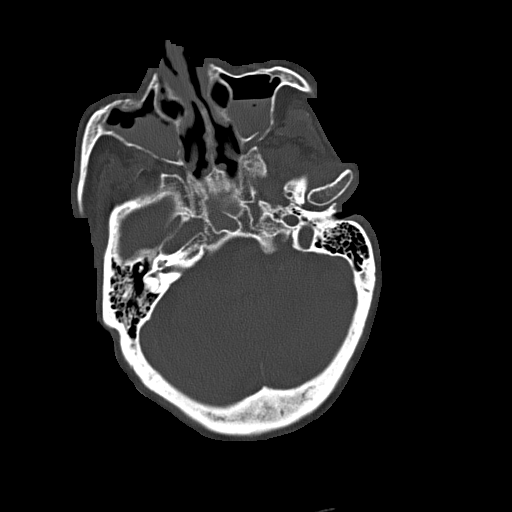
[im 20/64  bone]
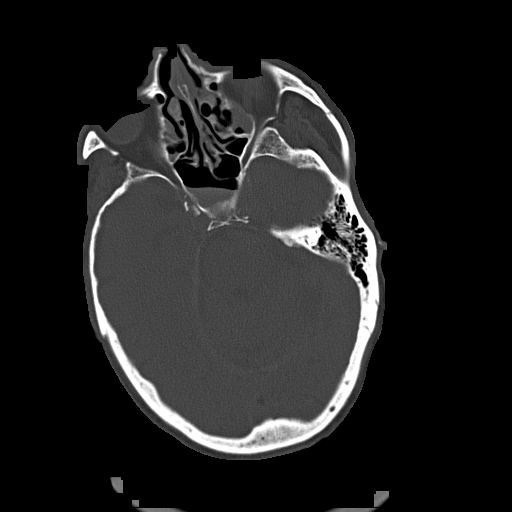
[im 27/64  bone]
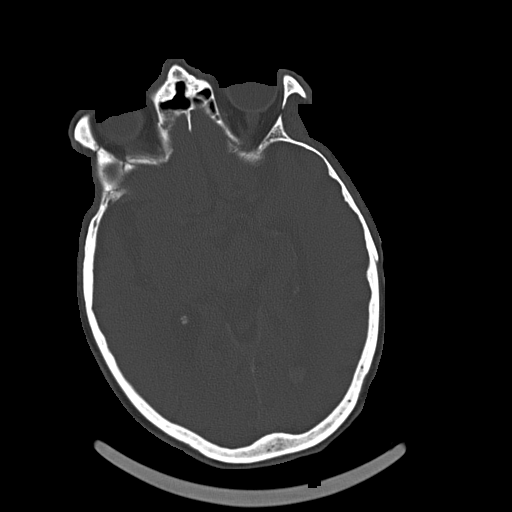
[im 37/64  bone]
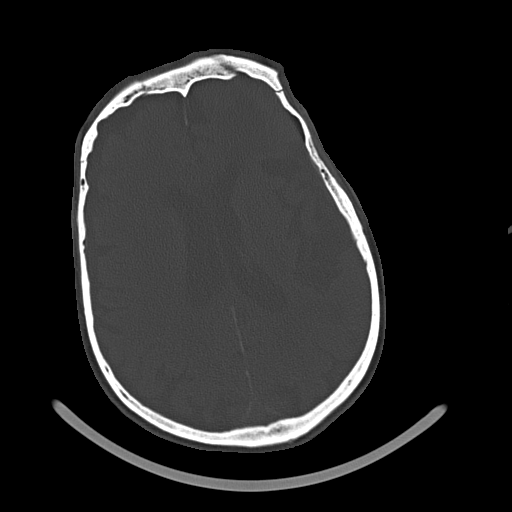
[im 44/64  bone]
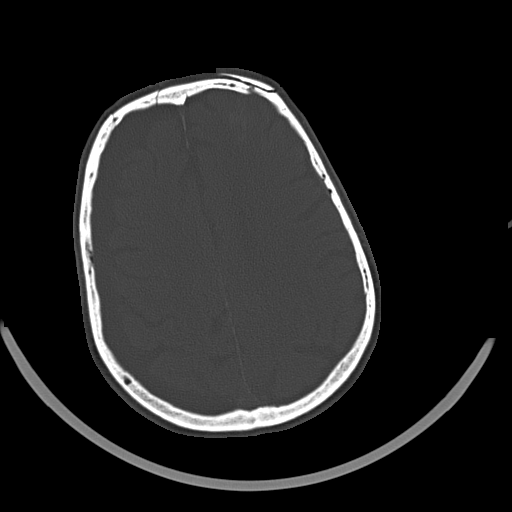
[im 50/64  bone]
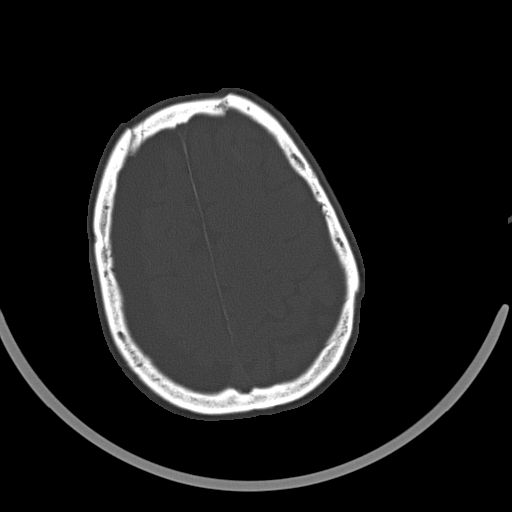
[im 57/64  bone]
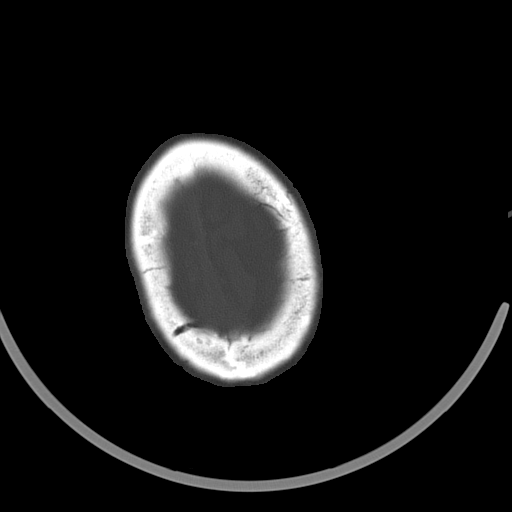

[16 of 30 positions shown; findings below may reference images not displayed]

FINDINGS: Comminuted skull fractures and intracranial hemorrhage were
described in [REDACTED]. Non healed comminuted bilateral frontal bone
skull fractures persist. Associated bilateral skullbase fractures
including at the maxillary sinuses. Hemorrhage a levels in the
maxillary and sphenoid sinuses. Tympanic cavities and mastoids
remain clear.

No acute orbit or scalp soft tissue finding identified.

Left frontal parenchymal hemorrhage was described in [REDACTED]. Residual
hyperdense blood products along the anterior left frontal lobe ir
noted in addition to an area of developing cortical
encephalomalacia.

There is a new right side extra-axial low density collection
measuring up to 8 mm in thickness. There is mild mass effect on the
right hemisphere, but no associated midline shift.

There is a small volume of bilateral occipital horn intraventricular
hemorrhage - subarachnoid hemorrhage was described in [REDACTED].

Basilar cisterns remain patent. No ventriculomegaly. Periventricular
and other patchy white matter hypodensity. No acute cortically based
infarct identified. Calcified atherosclerosis at the skull base.
IMPRESSION: 1. Sequelae of recent severe head trauma described on the 07/28/2015
head CT (no images available at this time).
2. New low-density right Subdural Hematoma versus Subdural Hygroma
measuring 8 mm in thickness. However, there is no associated midline
shift.
3. Left anterior frontal lobe parenchymal hemorrhage with small
volume subarachnoid and intraventricular hemorrhage appears to be
nonacute.
4. Un healed comminuted bilateral frontal bone and skullbase
fractures with hemorrhage layering in the paranasal sinuses.
#2 discussed by telephone with Dr. JEONGHEE DHANY on 08/08/2015 at [DATE] .

## 2016-04-13 IMAGING — CR DG CHEST 1V PORT
1 series · 1 of 1 positions shown · non-contrast
Comparison: 07/28/2015

CLINICAL DATA: Altered mental status

EXAM:
PORTABLE CHEST 1 VIEW

[AP]
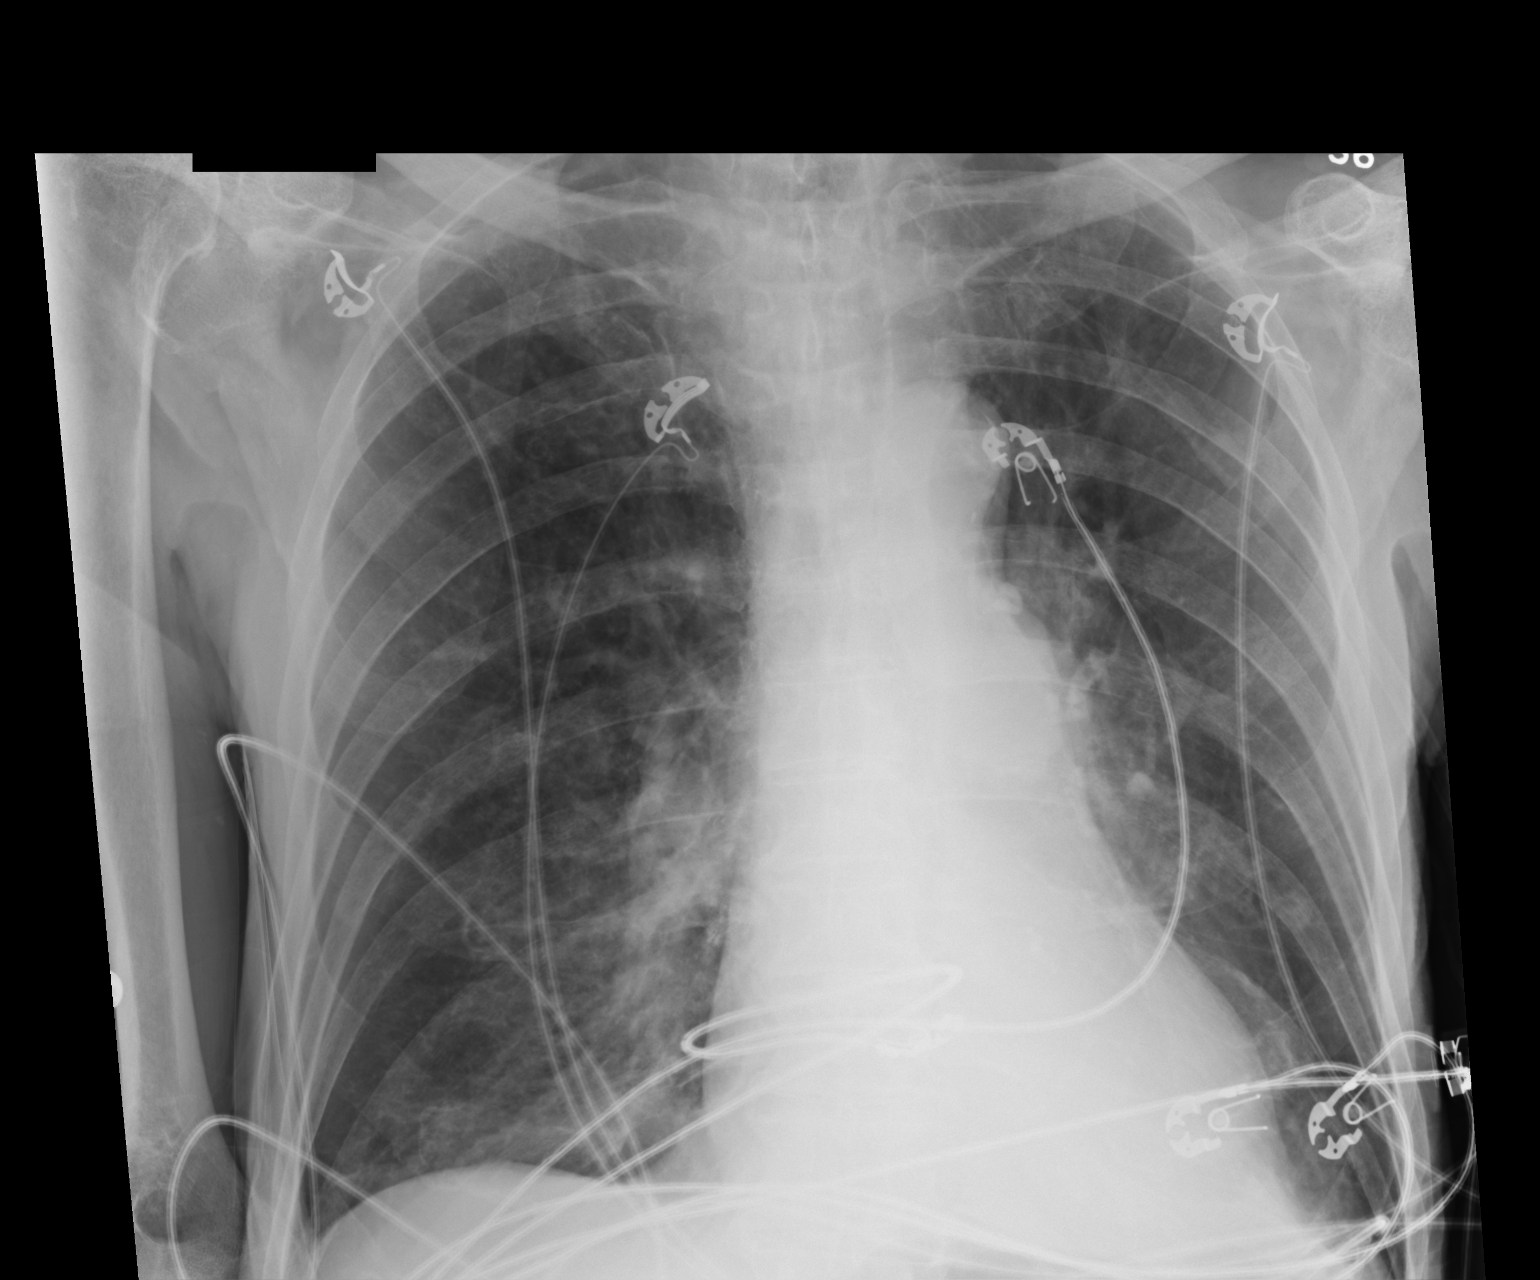

[1 of 1 positions shown; findings below may reference images not displayed]

FINDINGS: The lungs are hyperinflated likely secondary to COPD. There is no
focal parenchymal opacity. There is no pleural effusion or
pneumothorax. The heart and mediastinal contours are unremarkable.

The osseous structures are unremarkable.
IMPRESSION: No active disease.

## 2016-04-14 IMAGING — CR DG CHEST 2V
2 series · 3 of 3 positions shown · non-contrast
Comparison: For [DATE] and earlier.

CLINICAL DATA: 88-year-old male with shortness of Breath. Initial
encounter.

EXAM:
CHEST  2 VIEW

[chest lat]
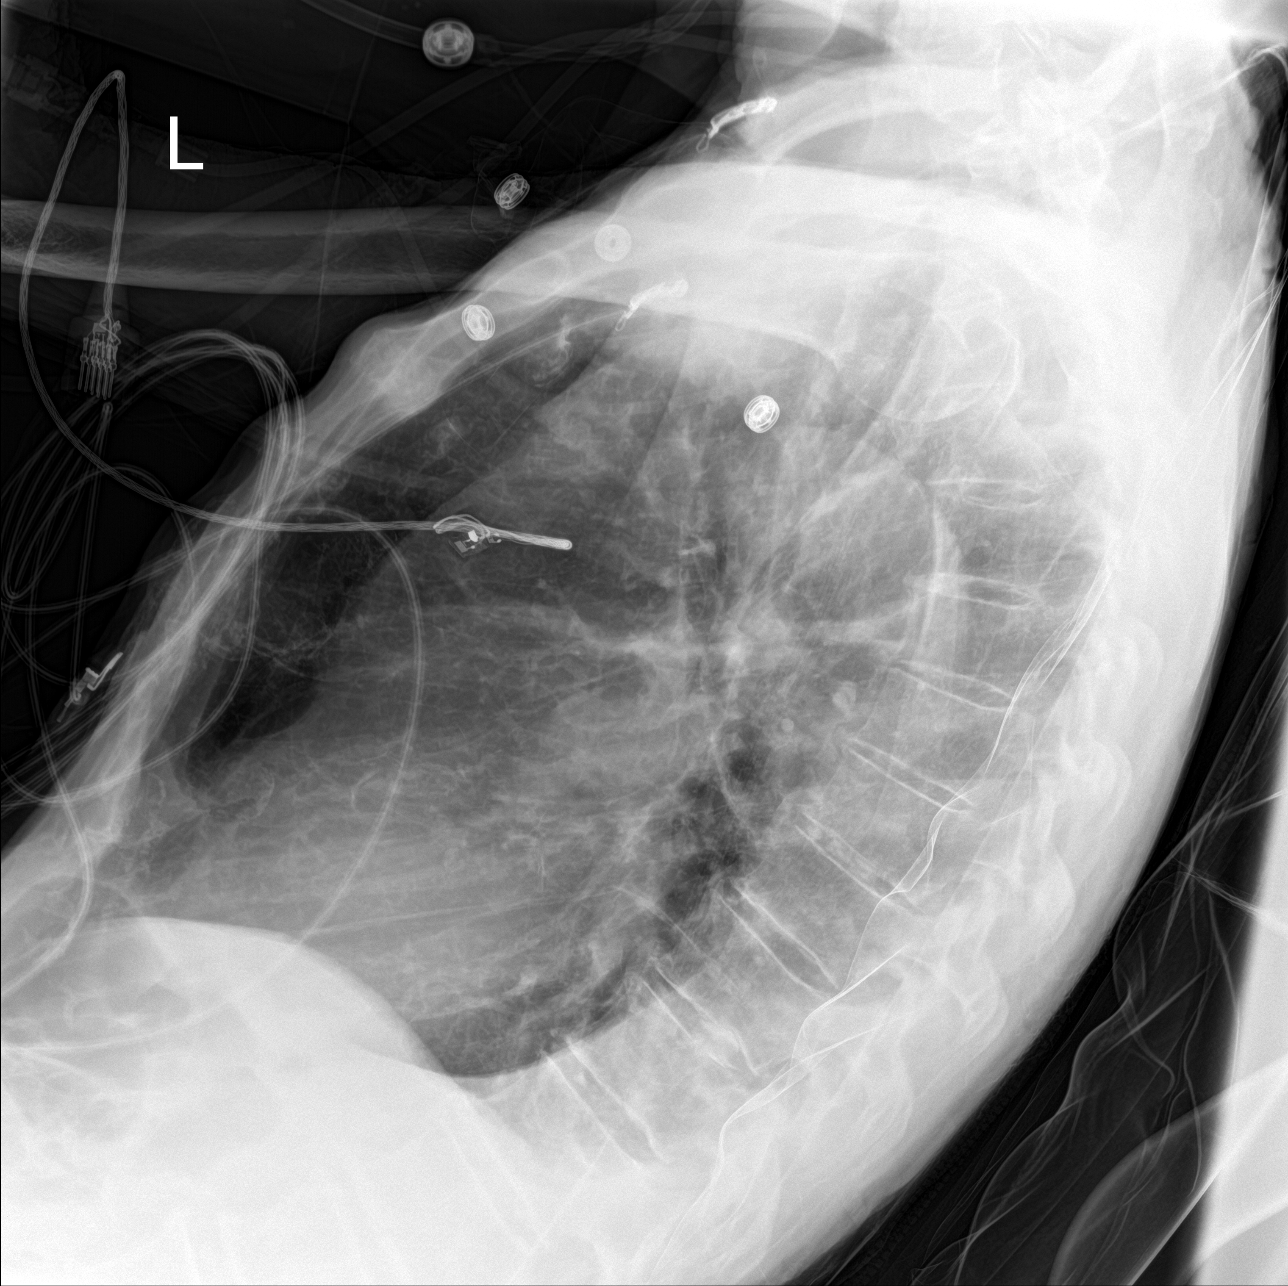

[Series 3: chest ap · 0.14mm/px · 2 of 2 slices shown]
[im 1/2]
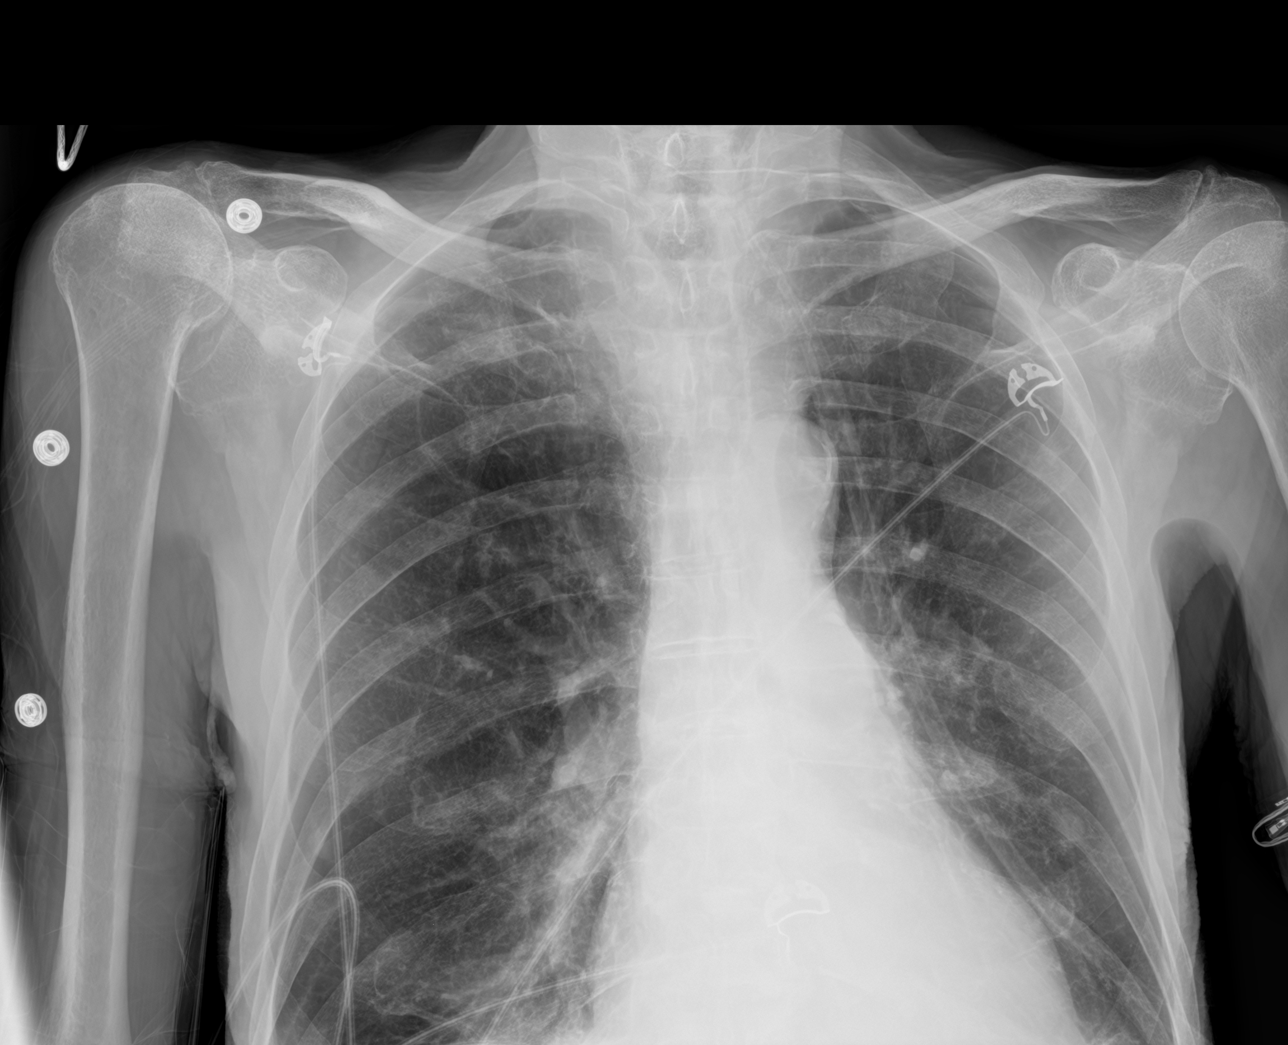
[im 2/2]
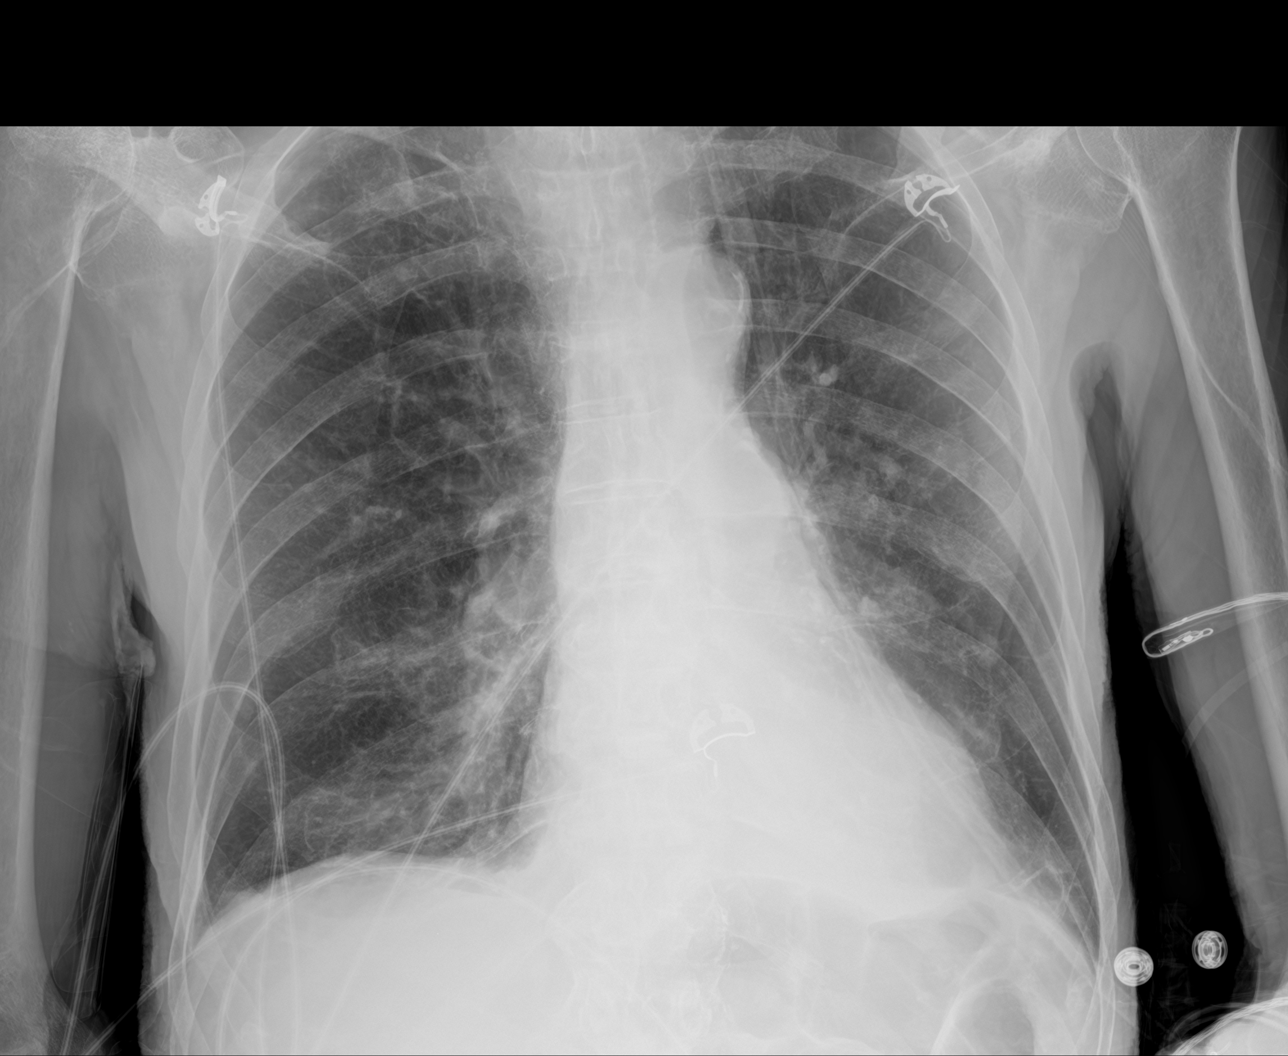

[3 of 3 positions shown; findings below may reference images not displayed]

FINDINGS: Semi upright AP and lateral views the chest. New complete left lower
lobe collapse since [REDACTED]. Underlying chronic lung disease with
emphysema. Questionable hilar enlargement on the lateral view, but
on the frontal mediastinal contours appear stable. Visualized
tracheal air column is within normal limits. Calcified aortic
atherosclerosis. No acute osseous abnormality identified.
IMPRESSION: New complete left lower lobe collapse since [REDACTED], with questionable
hilar enlargement on the lateral view. While this might be
infectious, Chest CT (IV contrast preferred) is recommended to
exclude an obstructing lesion.
